# Patient Record
Sex: Male | Born: 1935 | Race: Black or African American | Hispanic: No | Smoking: Former smoker
Health system: Southern US, Community
[De-identification: ages and names within clinical notes are randomized; demographics above are authoritative.]

## PROBLEM LIST (undated history)

## (undated) DIAGNOSIS — D509 Iron deficiency anemia, unspecified: Secondary | ICD-10-CM

## (undated) DIAGNOSIS — I10 Essential (primary) hypertension: Secondary | ICD-10-CM

## (undated) DIAGNOSIS — I5032 Chronic diastolic (congestive) heart failure: Secondary | ICD-10-CM

## (undated) DIAGNOSIS — I639 Cerebral infarction, unspecified: Secondary | ICD-10-CM

## (undated) DIAGNOSIS — D126 Benign neoplasm of colon, unspecified: Secondary | ICD-10-CM

## (undated) DIAGNOSIS — G8194 Hemiplegia, unspecified affecting left nondominant side: Secondary | ICD-10-CM

## (undated) DIAGNOSIS — R945 Abnormal results of liver function studies: Secondary | ICD-10-CM

## (undated) DIAGNOSIS — E7800 Pure hypercholesterolemia, unspecified: Secondary | ICD-10-CM

## (undated) DIAGNOSIS — R451 Restlessness and agitation: Secondary | ICD-10-CM

## (undated) DIAGNOSIS — H547 Unspecified visual loss: Secondary | ICD-10-CM

## (undated) DIAGNOSIS — T783XXA Angioneurotic edema, initial encounter: Secondary | ICD-10-CM

## (undated) DIAGNOSIS — J449 Chronic obstructive pulmonary disease, unspecified: Secondary | ICD-10-CM

## (undated) DIAGNOSIS — J189 Pneumonia, unspecified organism: Secondary | ICD-10-CM

## (undated) DIAGNOSIS — I509 Heart failure, unspecified: Secondary | ICD-10-CM

## (undated) DIAGNOSIS — I351 Nonrheumatic aortic (valve) insufficiency: Secondary | ICD-10-CM

## (undated) DIAGNOSIS — M959 Acquired deformity of musculoskeletal system, unspecified: Secondary | ICD-10-CM

## (undated) HISTORY — DX: Chronic diastolic (congestive) heart failure: I50.32

## (undated) HISTORY — DX: Iron deficiency anemia, unspecified: D50.9

## (undated) HISTORY — DX: Pure hypercholesterolemia, unspecified: E78.00

## (undated) HISTORY — PX: AORTIC VALVE REPLACEMENT: SHX41

## (undated) HISTORY — PX: MULTIPLE TOOTH EXTRACTIONS: SHX2053

## (undated) HISTORY — PX: CARDIAC VALVE REPLACEMENT: SHX585

## (undated) HISTORY — DX: Abnormal results of liver function studies: R94.5

## (undated) HISTORY — DX: Restlessness and agitation: R45.1

## (undated) HISTORY — DX: Angioneurotic edema, initial encounter: T78.3XXA

## (undated) HISTORY — DX: Chronic obstructive pulmonary disease, unspecified: J44.9

## (undated) HISTORY — DX: Benign neoplasm of colon, unspecified: D12.6

## (undated) HISTORY — DX: Essential (primary) hypertension: I10

## (undated) HISTORY — DX: Acquired deformity of musculoskeletal system, unspecified: M95.9

---

## 1997-01-04 HISTORY — PX: CARDIAC CATHETERIZATION: SHX172

## 1997-06-28 ENCOUNTER — Emergency Department (HOSPITAL_COMMUNITY): Admission: EM | Admit: 1997-06-28 | Discharge: 1997-06-28 | Payer: Self-pay | Admitting: *Deleted

## 1997-07-27 ENCOUNTER — Emergency Department (HOSPITAL_COMMUNITY): Admission: EM | Admit: 1997-07-27 | Discharge: 1997-07-27 | Payer: Self-pay | Admitting: Emergency Medicine

## 1997-08-06 ENCOUNTER — Encounter: Admission: RE | Admit: 1997-08-06 | Discharge: 1997-08-06 | Payer: Self-pay | Admitting: Family Medicine

## 1997-08-19 ENCOUNTER — Encounter: Admission: RE | Admit: 1997-08-19 | Discharge: 1997-08-19 | Payer: Self-pay | Admitting: Sports Medicine

## 1998-01-13 ENCOUNTER — Encounter: Admission: RE | Admit: 1998-01-13 | Discharge: 1998-01-13 | Payer: Self-pay | Admitting: Family Medicine

## 1998-03-07 ENCOUNTER — Encounter: Admission: RE | Admit: 1998-03-07 | Discharge: 1998-03-07 | Payer: Self-pay | Admitting: Family Medicine

## 1998-03-20 ENCOUNTER — Encounter: Admission: RE | Admit: 1998-03-20 | Discharge: 1998-03-20 | Payer: Self-pay | Admitting: Family Medicine

## 1998-04-01 ENCOUNTER — Encounter: Admission: RE | Admit: 1998-04-01 | Discharge: 1998-04-01 | Payer: Self-pay | Admitting: Family Medicine

## 1998-08-12 ENCOUNTER — Encounter: Payer: Self-pay | Admitting: Family Medicine

## 1998-08-12 ENCOUNTER — Inpatient Hospital Stay (HOSPITAL_COMMUNITY): Admission: EM | Admit: 1998-08-12 | Discharge: 1998-08-13 | Payer: Self-pay | Admitting: Emergency Medicine

## 1998-08-22 ENCOUNTER — Encounter: Admission: RE | Admit: 1998-08-22 | Discharge: 1998-08-22 | Payer: Self-pay | Admitting: Family Medicine

## 1998-09-05 ENCOUNTER — Encounter: Admission: RE | Admit: 1998-09-05 | Discharge: 1998-09-05 | Payer: Self-pay | Admitting: Family Medicine

## 1998-09-12 ENCOUNTER — Encounter: Admission: RE | Admit: 1998-09-12 | Discharge: 1998-09-12 | Payer: Self-pay | Admitting: Family Medicine

## 1999-03-30 ENCOUNTER — Encounter: Payer: Self-pay | Admitting: Sports Medicine

## 1999-03-30 ENCOUNTER — Encounter: Admission: RE | Admit: 1999-03-30 | Discharge: 1999-03-30 | Payer: Self-pay | Admitting: Sports Medicine

## 1999-03-30 ENCOUNTER — Encounter: Admission: RE | Admit: 1999-03-30 | Discharge: 1999-03-30 | Payer: Self-pay | Admitting: Family Medicine

## 1999-04-15 ENCOUNTER — Encounter: Admission: RE | Admit: 1999-04-15 | Discharge: 1999-04-15 | Payer: Self-pay | Admitting: Family Medicine

## 1999-05-04 ENCOUNTER — Encounter: Admission: RE | Admit: 1999-05-04 | Discharge: 1999-05-04 | Payer: Self-pay | Admitting: Family Medicine

## 1999-07-30 ENCOUNTER — Encounter: Admission: RE | Admit: 1999-07-30 | Discharge: 1999-07-30 | Payer: Self-pay | Admitting: Family Medicine

## 1999-09-15 ENCOUNTER — Encounter: Admission: RE | Admit: 1999-09-15 | Discharge: 1999-09-15 | Payer: Self-pay | Admitting: Family Medicine

## 1999-09-23 ENCOUNTER — Encounter: Admission: RE | Admit: 1999-09-23 | Discharge: 1999-09-23 | Payer: Self-pay | Admitting: Family Medicine

## 2000-05-03 ENCOUNTER — Encounter: Admission: RE | Admit: 2000-05-03 | Discharge: 2000-05-03 | Payer: Self-pay | Admitting: Sports Medicine

## 2000-05-03 ENCOUNTER — Encounter: Payer: Self-pay | Admitting: Sports Medicine

## 2000-05-16 ENCOUNTER — Encounter: Admission: RE | Admit: 2000-05-16 | Discharge: 2000-05-16 | Payer: Self-pay | Admitting: Family Medicine

## 2000-09-28 ENCOUNTER — Emergency Department (HOSPITAL_COMMUNITY): Admission: EM | Admit: 2000-09-28 | Discharge: 2000-09-28 | Payer: Self-pay

## 2000-11-29 ENCOUNTER — Encounter: Admission: RE | Admit: 2000-11-29 | Discharge: 2001-02-27 | Payer: Self-pay | Admitting: Orthopaedic Surgery

## 2000-12-06 ENCOUNTER — Encounter: Admission: RE | Admit: 2000-12-06 | Discharge: 2000-12-06 | Payer: Self-pay | Admitting: Family Medicine

## 2001-06-06 ENCOUNTER — Ambulatory Visit (HOSPITAL_COMMUNITY): Admission: RE | Admit: 2001-06-06 | Discharge: 2001-06-06 | Payer: Self-pay | Admitting: Sports Medicine

## 2001-06-06 ENCOUNTER — Encounter: Admission: RE | Admit: 2001-06-06 | Discharge: 2001-06-06 | Payer: Self-pay | Admitting: Sports Medicine

## 2001-07-10 ENCOUNTER — Encounter: Admission: RE | Admit: 2001-07-10 | Discharge: 2001-07-10 | Payer: Self-pay | Admitting: Sports Medicine

## 2001-09-25 ENCOUNTER — Encounter: Admission: RE | Admit: 2001-09-25 | Discharge: 2001-09-25 | Payer: Self-pay | Admitting: Family Medicine

## 2001-09-27 ENCOUNTER — Encounter: Admission: RE | Admit: 2001-09-27 | Discharge: 2001-09-27 | Payer: Self-pay | Admitting: Family Medicine

## 2002-01-11 ENCOUNTER — Encounter: Admission: RE | Admit: 2002-01-11 | Discharge: 2002-01-11 | Payer: Self-pay | Admitting: Family Medicine

## 2002-01-23 ENCOUNTER — Encounter: Payer: Self-pay | Admitting: Family Medicine

## 2002-01-23 ENCOUNTER — Encounter: Admission: RE | Admit: 2002-01-23 | Discharge: 2002-01-23 | Payer: Self-pay | Admitting: Family Medicine

## 2002-01-25 ENCOUNTER — Encounter: Admission: RE | Admit: 2002-01-25 | Discharge: 2002-01-25 | Payer: Self-pay | Admitting: Family Medicine

## 2002-04-04 ENCOUNTER — Encounter: Admission: RE | Admit: 2002-04-04 | Discharge: 2002-04-04 | Payer: Self-pay | Admitting: Family Medicine

## 2002-04-27 ENCOUNTER — Encounter: Admission: RE | Admit: 2002-04-27 | Discharge: 2002-04-27 | Payer: Self-pay | Admitting: Family Medicine

## 2002-05-28 ENCOUNTER — Encounter: Admission: RE | Admit: 2002-05-28 | Discharge: 2002-05-28 | Payer: Self-pay | Admitting: Family Medicine

## 2002-08-14 ENCOUNTER — Encounter: Admission: RE | Admit: 2002-08-14 | Discharge: 2002-08-14 | Payer: Self-pay | Admitting: Family Medicine

## 2002-08-21 ENCOUNTER — Encounter: Payer: Self-pay | Admitting: *Deleted

## 2002-08-21 ENCOUNTER — Encounter: Admission: RE | Admit: 2002-08-21 | Discharge: 2002-08-21 | Payer: Self-pay | Admitting: *Deleted

## 2002-09-18 ENCOUNTER — Encounter: Admission: RE | Admit: 2002-09-18 | Discharge: 2002-09-18 | Payer: Self-pay | Admitting: Sports Medicine

## 2002-10-16 ENCOUNTER — Ambulatory Visit (HOSPITAL_COMMUNITY): Admission: RE | Admit: 2002-10-16 | Discharge: 2002-10-16 | Payer: Self-pay | Admitting: Sports Medicine

## 2002-10-16 ENCOUNTER — Encounter: Payer: Self-pay | Admitting: Sports Medicine

## 2002-10-25 ENCOUNTER — Encounter: Admission: RE | Admit: 2002-10-25 | Discharge: 2002-10-25 | Payer: Self-pay | Admitting: Family Medicine

## 2002-10-29 ENCOUNTER — Encounter: Admission: RE | Admit: 2002-10-29 | Discharge: 2002-10-29 | Payer: Self-pay | Admitting: Sports Medicine

## 2002-10-29 ENCOUNTER — Encounter: Payer: Self-pay | Admitting: Sports Medicine

## 2002-11-26 ENCOUNTER — Encounter: Admission: RE | Admit: 2002-11-26 | Discharge: 2002-11-26 | Payer: Self-pay | Admitting: Family Medicine

## 2002-12-16 ENCOUNTER — Emergency Department (HOSPITAL_COMMUNITY): Admission: EM | Admit: 2002-12-16 | Discharge: 2002-12-16 | Payer: Self-pay | Admitting: Emergency Medicine

## 2003-01-02 ENCOUNTER — Encounter: Admission: RE | Admit: 2003-01-02 | Discharge: 2003-01-02 | Payer: Self-pay | Admitting: Family Medicine

## 2003-02-06 ENCOUNTER — Encounter: Admission: RE | Admit: 2003-02-06 | Discharge: 2003-02-06 | Payer: Self-pay | Admitting: Family Medicine

## 2003-03-08 ENCOUNTER — Encounter: Payer: Self-pay | Admitting: Gastroenterology

## 2003-05-09 ENCOUNTER — Encounter: Admission: RE | Admit: 2003-05-09 | Discharge: 2003-05-09 | Payer: Self-pay | Admitting: Family Medicine

## 2003-07-15 ENCOUNTER — Encounter: Admission: RE | Admit: 2003-07-15 | Discharge: 2003-07-15 | Payer: Self-pay | Admitting: Family Medicine

## 2003-11-18 ENCOUNTER — Ambulatory Visit: Payer: Self-pay | Admitting: Family Medicine

## 2003-11-20 ENCOUNTER — Ambulatory Visit: Payer: Self-pay | Admitting: Family Medicine

## 2003-12-23 ENCOUNTER — Ambulatory Visit: Payer: Self-pay | Admitting: Cardiology

## 2004-01-03 ENCOUNTER — Ambulatory Visit: Payer: Self-pay

## 2004-03-04 DIAGNOSIS — R945 Abnormal results of liver function studies: Secondary | ICD-10-CM

## 2004-03-04 DIAGNOSIS — R7989 Other specified abnormal findings of blood chemistry: Secondary | ICD-10-CM

## 2004-03-04 HISTORY — DX: Abnormal results of liver function studies: R94.5

## 2004-03-04 HISTORY — DX: Other specified abnormal findings of blood chemistry: R79.89

## 2004-03-16 ENCOUNTER — Ambulatory Visit: Payer: Self-pay | Admitting: Sports Medicine

## 2004-04-19 ENCOUNTER — Emergency Department (HOSPITAL_COMMUNITY): Admission: EM | Admit: 2004-04-19 | Discharge: 2004-04-19 | Payer: Self-pay | Admitting: Emergency Medicine

## 2004-04-20 ENCOUNTER — Encounter: Admission: RE | Admit: 2004-04-20 | Discharge: 2004-04-20 | Payer: Self-pay | Admitting: Family Medicine

## 2004-04-20 ENCOUNTER — Ambulatory Visit: Payer: Self-pay | Admitting: Family Medicine

## 2004-04-28 ENCOUNTER — Ambulatory Visit: Payer: Self-pay | Admitting: Sports Medicine

## 2004-09-21 ENCOUNTER — Ambulatory Visit: Payer: Self-pay | Admitting: Sports Medicine

## 2004-11-06 ENCOUNTER — Ambulatory Visit: Payer: Self-pay | Admitting: Family Medicine

## 2004-12-24 ENCOUNTER — Ambulatory Visit: Payer: Self-pay | Admitting: Cardiology

## 2005-01-11 ENCOUNTER — Encounter: Payer: Self-pay | Admitting: Cardiology

## 2005-01-11 ENCOUNTER — Ambulatory Visit: Payer: Self-pay

## 2005-03-25 ENCOUNTER — Ambulatory Visit: Payer: Self-pay | Admitting: Family Medicine

## 2005-03-26 ENCOUNTER — Ambulatory Visit (HOSPITAL_COMMUNITY): Admission: RE | Admit: 2005-03-26 | Discharge: 2005-03-26 | Payer: Self-pay | Admitting: Family Medicine

## 2005-03-26 ENCOUNTER — Encounter: Payer: Self-pay | Admitting: Vascular Surgery

## 2005-04-12 ENCOUNTER — Emergency Department (HOSPITAL_COMMUNITY): Admission: EM | Admit: 2005-04-12 | Discharge: 2005-04-12 | Payer: Self-pay | Admitting: Emergency Medicine

## 2005-04-18 ENCOUNTER — Inpatient Hospital Stay (HOSPITAL_COMMUNITY): Admission: EM | Admit: 2005-04-18 | Discharge: 2005-04-18 | Payer: Self-pay | Admitting: Emergency Medicine

## 2005-04-18 ENCOUNTER — Ambulatory Visit: Payer: Self-pay | Admitting: Family Medicine

## 2005-04-22 ENCOUNTER — Ambulatory Visit: Payer: Self-pay | Admitting: Family Medicine

## 2005-07-13 ENCOUNTER — Ambulatory Visit: Payer: Self-pay | Admitting: Family Medicine

## 2005-09-10 ENCOUNTER — Ambulatory Visit: Payer: Self-pay | Admitting: Family Medicine

## 2005-12-22 ENCOUNTER — Ambulatory Visit: Payer: Self-pay | Admitting: Family Medicine

## 2005-12-24 ENCOUNTER — Ambulatory Visit (HOSPITAL_COMMUNITY): Admission: RE | Admit: 2005-12-24 | Discharge: 2005-12-24 | Payer: Self-pay | Admitting: Family Medicine

## 2006-01-07 ENCOUNTER — Ambulatory Visit: Payer: Self-pay | Admitting: Family Medicine

## 2006-01-24 ENCOUNTER — Ambulatory Visit: Payer: Self-pay | Admitting: Cardiology

## 2006-01-24 LAB — CONVERTED CEMR LAB
ALT: 15 units/L (ref 0–40)
AST: 18 units/L (ref 0–37)
Alkaline Phosphatase: 79 units/L (ref 39–117)
BUN: 11 mg/dL (ref 6–23)
CO2: 25 meq/L (ref 19–32)
Chloride: 104 meq/L (ref 96–112)
GFR calc Af Amer: 76 mL/min
GFR calc non Af Amer: 62 mL/min
HDL: 77.1 mg/dL (ref >39.0)
Potassium: 3.2 meq/L — ABNORMAL LOW (ref 3.5–5.1)
Sodium: 140 meq/L (ref 135–145)
Total CHOL/HDL Ratio: 2.3
Triglycerides: 43 mg/dL (ref 0–149)
VLDL: 9 mg/dL (ref 0–40)

## 2006-02-03 ENCOUNTER — Ambulatory Visit: Payer: Self-pay

## 2006-02-03 ENCOUNTER — Encounter: Payer: Self-pay | Admitting: Cardiology

## 2006-02-15 ENCOUNTER — Ambulatory Visit: Payer: Self-pay

## 2006-02-15 LAB — CONVERTED CEMR LAB
CO2: 28 meq/L (ref 19–32)
Chloride: 103 meq/L (ref 96–112)
GFR calc non Af Amer: 69 mL/min
Potassium: 4.1 meq/L (ref 3.5–5.1)

## 2006-03-03 DIAGNOSIS — I5032 Chronic diastolic (congestive) heart failure: Secondary | ICD-10-CM

## 2006-03-03 DIAGNOSIS — I2510 Atherosclerotic heart disease of native coronary artery without angina pectoris: Secondary | ICD-10-CM | POA: Insufficient documentation

## 2006-03-03 DIAGNOSIS — D126 Benign neoplasm of colon, unspecified: Secondary | ICD-10-CM

## 2006-03-03 DIAGNOSIS — I10 Essential (primary) hypertension: Secondary | ICD-10-CM

## 2006-03-03 DIAGNOSIS — D509 Iron deficiency anemia, unspecified: Secondary | ICD-10-CM

## 2006-03-03 DIAGNOSIS — M959 Acquired deformity of musculoskeletal system, unspecified: Secondary | ICD-10-CM | POA: Insufficient documentation

## 2006-03-03 DIAGNOSIS — I351 Nonrheumatic aortic (valve) insufficiency: Secondary | ICD-10-CM | POA: Insufficient documentation

## 2006-03-03 DIAGNOSIS — J449 Chronic obstructive pulmonary disease, unspecified: Secondary | ICD-10-CM | POA: Insufficient documentation

## 2006-03-03 DIAGNOSIS — E7800 Pure hypercholesterolemia, unspecified: Secondary | ICD-10-CM | POA: Insufficient documentation

## 2006-03-03 DIAGNOSIS — T783XXA Angioneurotic edema, initial encounter: Secondary | ICD-10-CM | POA: Insufficient documentation

## 2007-05-04 ENCOUNTER — Ambulatory Visit: Payer: Self-pay | Admitting: Cardiology

## 2007-05-17 ENCOUNTER — Ambulatory Visit: Payer: Self-pay

## 2007-05-17 ENCOUNTER — Encounter: Payer: Self-pay | Admitting: Cardiology

## 2007-07-22 ENCOUNTER — Encounter: Admission: RE | Admit: 2007-07-22 | Discharge: 2007-07-22 | Payer: Self-pay | Admitting: Internal Medicine

## 2008-02-28 ENCOUNTER — Encounter (INDEPENDENT_AMBULATORY_CARE_PROVIDER_SITE_OTHER): Payer: Self-pay | Admitting: *Deleted

## 2008-04-03 ENCOUNTER — Ambulatory Visit: Payer: Self-pay | Admitting: Gastroenterology

## 2008-04-03 DIAGNOSIS — Z952 Presence of prosthetic heart valve: Secondary | ICD-10-CM

## 2008-04-12 ENCOUNTER — Telehealth: Payer: Self-pay | Admitting: Gastroenterology

## 2008-05-07 ENCOUNTER — Encounter: Payer: Self-pay | Admitting: Gastroenterology

## 2008-05-07 ENCOUNTER — Ambulatory Visit: Payer: Self-pay | Admitting: Gastroenterology

## 2008-05-09 ENCOUNTER — Encounter: Payer: Self-pay | Admitting: Gastroenterology

## 2008-05-16 ENCOUNTER — Telehealth: Payer: Self-pay | Admitting: Gastroenterology

## 2008-06-06 DIAGNOSIS — E785 Hyperlipidemia, unspecified: Secondary | ICD-10-CM | POA: Insufficient documentation

## 2008-06-07 ENCOUNTER — Ambulatory Visit: Payer: Self-pay | Admitting: Cardiology

## 2008-06-21 ENCOUNTER — Ambulatory Visit: Payer: Self-pay

## 2008-06-21 ENCOUNTER — Encounter: Payer: Self-pay | Admitting: Cardiology

## 2009-05-09 ENCOUNTER — Encounter (INDEPENDENT_AMBULATORY_CARE_PROVIDER_SITE_OTHER): Payer: Self-pay | Admitting: *Deleted

## 2009-07-11 ENCOUNTER — Ambulatory Visit: Payer: Self-pay | Admitting: Gastroenterology

## 2009-07-11 DIAGNOSIS — Z860100 Personal history of colon polyps, unspecified: Secondary | ICD-10-CM | POA: Insufficient documentation

## 2009-07-31 ENCOUNTER — Ambulatory Visit: Payer: Self-pay | Admitting: Gastroenterology

## 2009-08-05 ENCOUNTER — Encounter: Payer: Self-pay | Admitting: Gastroenterology

## 2009-12-04 ENCOUNTER — Ambulatory Visit: Payer: Self-pay | Admitting: Cardiology

## 2009-12-04 ENCOUNTER — Encounter: Payer: Self-pay | Admitting: Cardiology

## 2009-12-05 ENCOUNTER — Ambulatory Visit: Payer: Self-pay | Admitting: Cardiology

## 2009-12-05 ENCOUNTER — Ambulatory Visit: Payer: Self-pay

## 2009-12-05 ENCOUNTER — Encounter: Payer: Self-pay | Admitting: Cardiology

## 2009-12-05 ENCOUNTER — Ambulatory Visit (HOSPITAL_COMMUNITY)
Admission: RE | Admit: 2009-12-05 | Discharge: 2009-12-05 | Payer: Self-pay | Source: Home / Self Care | Admitting: Cardiology

## 2009-12-05 LAB — CONVERTED CEMR LAB
BUN: 26 mg/dL — ABNORMAL HIGH (ref 6–23)
Calcium: 8.6 mg/dL (ref 8.4–10.5)
Creatinine, Ser: 1 mg/dL (ref 0.4–1.5)
Glucose, Bld: 89 mg/dL (ref 70–99)
Sodium: 137 meq/L (ref 135–145)

## 2010-02-03 NOTE — Assessment & Plan Note (Signed)
Summary: per check out/sf  Medications Added AGGRENOX 25-200 MG XR12H-CAP (ASPIRIN-DIPYRIDAMOLE) Take 1 tablet by mouth two times a day PERCOCET 5-325 MG TABS (OXYCODONE-ACETAMINOPHEN) as needed      Allergies Added: NKDA  Visit Type:  Follow-up Primary Provider:  Frutoso Chase, MD  CC:  no complaints.  History of Present Illness: [name] is a pleasant gentleman who has a history of aortic valvereplacement with a pericardial tissue valve.  His most recentechocardiogram was performed in June of 2010.  His LV function was normal. There was a bioprosthetic aortic valve. There was mild aortic insufficiency and mild MR; mild LAE.  Note, at the time of his aortic valve replacement a catheterization in 1999 showed no obstructive disease prior to the procedure. I last saw the patient in June of 2010. Since then the patient denies any dyspnea on exertion, orthopnea, PND, pedal edema, palpitations, syncope or chest pain. His daughter gives him Lasix 20 mg daily as needed but he only requires this on average monthly.   Current Medications (verified): 1)  Cozaar 50 Mg Tabs (Losartan Potassium) .... Take 1 Tablet Daily 2)  Lasix 20 Mg Tabs (Furosemide) .... Take 1 Tablet Daily As Needed 3)  Potassium Chloride Crys Cr 20 Meq Cr-Tabs (Potassium Chloride Crys Cr) .... Take 1 Tab As Needed 4)  Multivitamins   Tabs (Multiple Vitamin) .... One Tablet By Mouth Once Daily 5)  Vesicare 5 Mg Tabs (Solifenacin Succinate) .... One Tablet By Mouth Once Daily 6)  Advair Diskus 250-50 Mcg/dose Misc (Fluticasone-Salmeterol) .... 2 Puffs Once Daily 7)  Aggrenox 25-200 Mg Xr12h-Cap (Aspirin-Dipyridamole) .... Take 1 Tablet By Mouth Two Times A Day 8)  Miralax  Powd (Polyethylene Glycol 3350) .... As Needed 9)  Percocet 5-325 Mg Tabs (Oxycodone-Acetaminophen) .... As Needed  Allergies (verified): No Known Drug Allergies  Past History:  Past Medical History: CEREBROVAS DIS, LATE EFFECTS (S/P CVA)  (ICD-738.9) CHF, EJECTION FRACTION > OR = 50% (ICD-428.32) AORTIC VALVE INSUFFICIENCY (ICD-424.1) AORTIC VALVE REPLACEMENT, HX OF (ICD-V43.3) HYPERTENSION, BENIGN SYSTEMIC (ICD-401.1) HYPERCHOLESTEROLEMIA (ICD-272.0) COPD (ICD-496) COLON POLYP (ICD-211.3) ANGIOEDEMA (ICD-995.1) ANEMIA, IRON DEFICIENCY, UNSPEC. (ICD-280.9)  3/06 LFT`s wnl, Cr= 1.3 stable, 4/06 likely TIA, 4/06 pt may have DISH syndrome (thick c vertebrae), ataxic gait 2/2 to L hemiparesis, bx of scalp lesions = blue nevus (benign) 6/04, colonoscpy3/05 - adenomatous polyps,mild gastritis, -diffuse interosseous skeletal hyperostosis (DISH), h/o H. pylori s/p tx  Past Surgical History: Reviewed history from 06/07/2008 and no changes required.  Aortic Valve Replacement with a pericardial tissue valve  -, Cardiac Cath 01/99: mild CAD; EF: 35-40%   Social History: Reviewed history from 03/03/2006 and no changes required. Married to [employer].  Remote  h/o  tob abuse.  Children involved  in his life.  Lives with daughter.  Review of Systems       no fevers or chills, productive cough, hemoptysis, dysphasia, odynophagia, melena, hematochezia, dysuria, hematuria, rash, seizure activity, orthopnea, PND, pedal edema, claudication. Remaining systems are negative.   Vital Signs:  Patient profile:   77 year old male Height:      66 inches Weight:      157 pounds BMI:     25.43 Pulse rate:   64 / minute BP sitting:   122 / 74  (right arm) Cuff size:   regular  Vitals Entered By: Hardin Negus, RMA (December 04, 2009 9:06 AM)  Physical Exam  General:  Well-developed well-nourished in no acute distress.  Skin is warm and dry.  HEENT is normal.  Neck is supple. No thyromegaly.  Chest is clear to auscultation with normal expansion. 2/6 systolic murmur left sternal border. No diastolic murmur. Cardiovascular exam is regular rate and rhythm.  Abdominal exam nontender or distended. No masses palpated. Extremities show no  edema. neuro residual left hemiparesis from prior CVA.    EKG  Procedure date:  12/04/2009  Findings:      Sinus rhythm, first degree AV block, left anterior fascicular block, left ventricular hypertrophy, cannot rule out prior anterior infarct.  Impression & Recommendations:  Problem # 1:  AORTIC VALVE INSUFFICIENCY (ICD-424.1) Status post aortic valve replacement. Check echocardiogram. Continued SBE prophylaxis. His updated medication list for this problem includes:    Cozaar 50 Mg Tabs (Losartan potassium) .Marland Kitchen... Take 1 tablet daily    Lasix 20 Mg Tabs (Furosemide) .Marland Kitchen... Take 1 tablet daily as needed  Problem # 2:  HYPERLIPIDEMIA (ICD-272.4) Management per primary care.  Problem # 3:  CEREBROVAS DIS, LATE EFFECTS (S/P CVA) (ICD-738.9)  Problem # 4:  HYPERTENSION, BENIGN SYSTEMIC (ICD-401.1) Blood pressure controlled on present medications. Will continue. Check potassium and renal function. His updated medication list for this problem includes:    Cozaar 50 Mg Tabs (Losartan potassium) .Marland Kitchen... Take 1 tablet daily    Lasix 20 Mg Tabs (Furosemide) .Marland Kitchen... Take 1 tablet daily as needed  Other Orders: EKG w/ Interpretation (93000) Echocardiogram (Echo) TLB-BMP (Basic Metabolic Panel-BMET) (80048-METABOL)  Patient Instructions: 1)  Your physician recommends that you schedule a follow-up appointment in: 1 year. 2)  Your physician recommends that you continue on your current medications as directed. Please refer to the Current Medication list given to you today. 3)  Your physician has requested that you have an echocardiogram.  Echocardiography is a painless test that uses sound waves to create images of your heart. It provides your doctor with information about the size and shape of your heart and how well your heart's chambers and valves are working.  This procedure takes approximately one hour. There are no restrictions for this procedure.

## 2010-02-03 NOTE — Letter (Signed)
Summary: Patient Notice- Polyp Results  [facility]  [address] [place], [place] [address]   Phone: [phone]  Fax: [phone]        August 05, 2009 MRN: [id number]    [facility] [address] [place], [place]  [address]    Dear Mr. THONG,  I am pleased to inform you that the colon polyp(s) removed during your recent colonoscopy was (were) found to be benign (no cancer detected) upon pathologic examination.  I recommend you have a repeat colonoscopy examination in 5_ years to look for recurrent polyps, as having colon polyps increases your risk for having recurrent polyps or even colon cancer in the future.  Should you develop new or worsening symptoms of abdominal pain, bowel habit changes or bleeding from the rectum or bowels, please schedule an evaluation with either your primary care physician or with me.  Additional information/recommendations:  __ No further action with gastroenterology is needed at this time. Please      follow-up with your primary care physician for your other healthcare      needs.  __ Please call [phone] to schedule a return visit to review your      situation.  __ Please keep your follow-up visit as already scheduled.  _x_ Continue treatment plan as outlined the day of your exam.  Please call us if you are having persistent problems or have questions about your condition that have not been fully answered at this time.  Sincerely,  Louis Meckel MD  This letter has been electronically signed by your physician.  Appended Document: Patient Notice- Polyp Results Letter mailed 8.3.2011

## 2010-02-03 NOTE — Letter (Signed)
Summary: [facility] Instructions  [facility]  [address] [place], [place] [address]   Phone: [phone]  Fax: [phone]       Aaron Johnson    1926/02/02    MRN: [id number]        Procedure Day /Date:THURSDAY 07/31/2009     Arrival Time:2PM     Procedure Time:3PM     Location of Procedure:                    X   [facility] (4th Floor)  PREPARATION FOR COLONOSCOPY WITH MOVIPREP   Starting 5 days prior to your procedure 07/26/2009 do not eat nuts, seeds, popcorn, corn, beans, peas,  salads, or any raw vegetables.  Do not take any fiber supplements (e.g. Metamucil, Citrucel, and Benefiber).  THE DAY BEFORE YOUR PROCEDURE         DATE: 07/30/2009  DAY: WEDNESDAY  1.  Drink clear liquids the entire day-NO SOLID FOOD  2.  Do not drink anything colored red or purple.  Avoid juices with pulp.  No orange juice.  3.  Drink at least 64 oz. (8 glasses) of fluid/clear liquids during the day to prevent dehydration and help the prep work efficiently.  CLEAR LIQUIDS INCLUDE: Water Jello Ice Popsicles Tea (sugar ok, no milk/cream) Powdered fruit flavored drinks Coffee (sugar ok, no milk/cream) Gatorade Juice: apple, white grape, white cranberry  Lemonade Clear bullion, consomm, broth Carbonated beverages (any kind) Strained chicken noodle soup Hard Candy                             4.  In the morning, mix first dose of MoviPrep solution:    Empty 1 Pouch A and 1 Pouch B into the disposable container    Add lukewarm drinking water to the top line of the container. Mix to dissolve    Refrigerate (mixed solution should be used within 24 hrs)  5.  Begin drinking the prep at 5:00 p.m. The MoviPrep container is divided by 4 marks.   Every 15 minutes drink the solution down to the next mark (approximately 8 oz) until the full liter is complete.   6.  Follow completed prep with 16 oz of clear liquid of your choice (Nothing red or purple).  Continue to  drink clear liquids until bedtime.  7.  Before going to bed, mix second dose of MoviPrep solution:    Empty 1 Pouch A and 1 Pouch B into the disposable container    Add lukewarm drinking water to the top line of the container. Mix to dissolve    Refrigerate  THE DAY OF YOUR PROCEDURE      DATE: 07/31/2009 DAY: THURSDAY  Beginning at 10a.m. (5 hours before procedure):         1. Every 15 minutes, drink the solution down to the next mark (approx 8 oz) until the full liter is complete.  2. Follow completed prep with 16 oz. of clear liquid of your choice.    3. You may drink clear liquids until1PM (2 HOURS BEFORE PROCEDURE).   MEDICATION INSTRUCTIONS  Unless otherwise instructed, you should take regular prescription medications with a small sip of water   as early as possible the morning of your procedure.    Stop taking Aggrenox on  07/24/2009 PER DR KAPLAN  (7 days before procedure).  OTHER INSTRUCTIONS  You will need a responsible adult at least 75 years of age to accompany you and drive you home.   This person must remain in the waiting room during your procedure.  Wear loose fitting clothing that is easily removed.  Leave jewelry and other valuables at home.  However, you may wish to bring a book to read or  an iPod/MP3 player to listen to music as you wait for your procedure to start.  Remove all body piercing jewelry and leave at home.  Total time from sign-in until discharge is approximately 2-3 hours.  You should go home directly after your procedure and rest.  You can resume normal activities the  day after your procedure.  The day of your procedure you should not:   Drive   Make legal decisions   Operate machinery   Drink alcohol   Return to work  You will receive specific instructions about eating, activities and medications before you leave.    The above instructions have been reviewed and explained to me by    _______________________    I fully understand and can verbalize these instructions _____________________________ Date _________

## 2010-02-03 NOTE — Letter (Signed)
Summary: Colonoscopy-Changed to Office Visit Letter  [facility]  [address] [place], [place] [address]   Phone: [phone]  Fax: [phone]      May 09, 2009 MRN: [id number]   Aaron Johnson [address] [place], [place]  [address]   Dear Aaron Johnson,   According to our records, it is time for you to schedule a Colonoscopy. However, after reviewing your medical record, I feel that an office visit would be most appropriate to more completely evaluate you and determine your need for a repeat procedure.  Please call [phone] (option #2) at your convenience to schedule an office visit. If you have any questions, concerns, or feel that this letter is in error, we would appreciate your call.   Sincerely,  Barbette Hair. Arlyce Dice, M.D.  [facility] [phone]

## 2010-02-03 NOTE — Procedures (Signed)
Summary: Colonoscopy  Patient: [name] Note: All result statuses are Final unless otherwise noted.  Tests: (1) Colonoscopy (COL)   COL Colonoscopy           DONE     [facility]     [address]     [place], [place]  [address]           COLONOSCOPY PROCEDURE REPORT           PATIENT:  Aaron Johnson, Aaron Johnson  MR#:  [id number]     BIRTHDATE:  05-25-1926, 83 yrs. old  GENDER:  male           ENDOSCOPIST:  Barbette Hair. Arlyce Dice, MD     Referred by:           PROCEDURE DATE:  07/31/2009     PROCEDURE:  Colonoscopy with snare polypectomy     ASA CLASS:  Class II     INDICATIONS:  1) screening  2) history of pre-cancerous     (adenomatous) colon polyps Multiple right colon polyps removed 1     year ago. f/u colon b/c of multiplicity of polyps           MEDICATIONS:   Fentanyl 50 mcg IV, Versed 4 mg IV           DESCRIPTION OF PROCEDURE:   After the risks benefits and     alternatives of the procedure were thoroughly explained, informed     consent was obtained.  Digital rectal exam was performed and     revealed no abnormalities.   The LB [id number] endoscope     was introduced through the anus and advanced to the cecum, which     was identified by both the appendix and ileocecal valve, without     limitations.  The quality of the prep was good, using MoviPrep.     The instrument was then slowly withdrawn as the colon was fully     examined.     <<PROCEDUREIMAGES>>           FINDINGS:  A sessile polyp was found in the ascending colon. It     was 3 mm in size. Polyp was snared without cautery. Retrieval was     successful (see image3). snare polyp  Mild diverticulosis was     found in the sigmoid colon (see image10).  This was otherwise a     normal examination of the colon (see image1, image2, image4,     image6, image8, image12, and image13).   Retroflexed views in the     rectum revealed no abnormalities.    The time to cecum =  5.0     minutes. The scope was then  withdrawn (time =  7.50  min) from the     patient and the procedure completed.           COMPLICATIONS:  None           ENDOSCOPIC IMPRESSION:     1) 3 mm sessile polyp in the ascending colon     2) Mild diverticulosis in the sigmoid colon     3) Otherwise normal examination     RECOMMENDATIONS:     1) Colonoscopy 5 years           REPEAT EXAM:  In 5 year(s) for Colonoscopy.           ______________________________     Barbette Hair. Arlyce Dice, MD  CC: Renford Dills MD           n.     Rosalie DoctorBarbette Hair. Ayaan Shutes at 07/31/2009 04:08 PM           Percell Belt, [id number]  Note: An exclamation mark (!) indicates a result that was not dispersed into the flowsheet. Document Creation Date: 07/31/2009 4:10 PM _______________________________________________________________________  (1) Order result status: Final Collection or observation date-time: 07/31/2009 16:00 Requested date-time:  Receipt date-time:  Reported date-time:  Referring Physician:   Ordering Physician: Melvia Heaps [phone]) Specimen Source:  Source: Launa Grill Order Number: [id number] Lab site:   Appended Document: Colonoscopy     Procedures Next Due Date:    Colonoscopy: 07/2014

## 2010-02-03 NOTE — Assessment & Plan Note (Signed)
Summary: f/u--ch.    History of Present Illness Visit Type: Follow-up Visit Primary GI MD: Melvia Heaps MD [facility] Primary Provider: Frutoso Chase, MD Chief Complaint: Annual visit, no problems History of Present Illness:   Mr. Tesar has returned for followup of his colon polyps.  Approximately one year ago several sessile right colon polyps were removed.  Followup colonoscopy in one year was recommended.  He has no GI complaints including abdominal pain, change in bowel habits, or rectal bleeding.  He remains on Aggrenox because of a history of a CVA.  The patient is status post aortic valve replacement.  He has COPD.   GI Review of Systems      Denies abdominal pain, acid reflux, belching, bloating, chest pain, dysphagia with liquids, dysphagia with solids, heartburn, loss of appetite, nausea, vomiting, vomiting blood, weight loss, and  weight gain.        Denies anal fissure, black tarry stools, change in bowel habit, constipation, diarrhea, diverticulosis, fecal incontinence, heme positive stool, hemorrhoids, irritable bowel syndrome, jaundice, light color stool, liver problems, rectal bleeding, and  rectal pain.    Current Medications (verified): 1)  Cozaar 50 Mg Tabs (Losartan Potassium) .... Take 1 Tablet Daily 2)  Lasix 20 Mg Tabs (Furosemide) .... Take 1 Tablet Daily As Needed 3)  Potassium Chloride Crys Cr 20 Meq Cr-Tabs (Potassium Chloride Crys Cr) .... Take 1 Tab As Needed 4)  Multivitamins   Tabs (Multiple Vitamin) .... One Tablet By Mouth Once Daily 5)  Vesicare 5 Mg Tabs (Solifenacin Succinate) .... One Tablet By Mouth Once Daily 6)  Advair Diskus 250-50 Mcg/dose Misc (Fluticasone-Salmeterol) .... 2 Puffs Once Daily 7)  Aggrenox 25-200 Mg Xr12h-Cap (Aspirin-Dipyridamole) .... 2 Tablets By Mouth Once Daily 8)  Miralax  Powd (Polyethylene Glycol 3350) .... As Needed  Allergies (verified): No Known Drug Allergies  Past History:  Past Medical History: Reviewed  history from 06/07/2008 and no changes required. Current Problems:  CEREBROVAS DIS, LATE EFFECTS (S/P CVA) (ICD-738.9) CHF, EJECTION FRACTION > OR = 50% (ICD-428.32) AORTIC VALVE INSUFFICIENCY (ICD-424.1) AORTIC VALVE REPLACEMENT, HX OF (ICD-V43.3) HYPERTENSION, BENIGN SYSTEMIC (ICD-401.1) HYPERCHOLESTEROLEMIA (ICD-272.0) COPD (ICD-496) COLON POLYP (ICD-211.3) ANGIOEDEMA (ICD-995.1) ANEMIA, IRON DEFICIENCY, UNSPEC. (ICD-280.9) SPECIAL SCREENING FOR MALIGNANT NEOPLASMS COLON (ICD-V76.51)  3/06 LFT`s wnl, Cr= 1.3 stable, 4/06 likely TIA, 4/06 pt may have DISH syndrome (thick c vertebrae), ataxic gait 2/2 to L hemiparesis, bx of scalp lesions = blue nevus (benign) 6/04, colonoscpy3/05 - adenomatous polyps,mild gastritis, -diffuse interosseous skeletal hyperostosis (DISH), h/o H. pylori s/p tx  Past Surgical History: Reviewed history from 06/07/2008 and no changes required.  Aortic Valve Replacement with a pericardial tissue valve  -, Cardiac Cath 01/99: mild CAD; EF: 35-40%   Family History: Reviewed history from 04/03/2008 and no changes required. No FH of Colon Cancer: Family History of Heart Disease: Father  Social History: Reviewed history from 03/03/2006 and no changes required. Married to [employer].  Remote  h/o  tob abuse.  Children involved  in his life.  Lives with daughter.  Review of Systems       The patient complains of arthritis/joint pain, change in vision, heart rhythm changes, and urination changes/pain.  The patient denies allergy/sinus, anemia, anxiety-new, back pain, blood in urine, breast changes/lumps, confusion, cough, coughing up blood, depression-new, fainting, fatigue, fever, headaches-new, hearing problems, heart murmur, itching, menstrual pain, muscle pains/cramps, night sweats, nosebleeds, pregnancy symptoms, shortness of breath, skin rash, sleeping problems, sore throat, swelling of feet/legs, swollen lymph glands, thirst - excessive ,  urination - excessive ,  urine leakage, vision changes, and voice change.         All other systems were reviewed and were negative   Vital Signs:  Patient profile:   77 year old male Height:      66 inches Weight:      150 pounds BMI:     24.30 Pulse rate:   60 / minute Pulse rhythm:   irregular BP sitting:   110 / 68  (right arm) Cuff size:   regular  Vitals Entered By: June McMurray CMA Duncan Dull) (July 11, 2009 10:17 AM)  Physical Exam  Additional Exam:  On physical exam he is an only male in no acute distress  skin: anicteric HEENT: normocephalic; PEERLA; no nasal or pharyngeal abnormalities neck: supple nodes: no cervical lymphadenopathy chest: clear to ausculatation and percussion heart: no murmurs, gallops, or rubs abd: soft, nontender; BS normoactive; no abdominal masses, tenderness, organomegaly rectal: deferred ext: no cynanosis, clubbing, edema skeletal: no deformities neuro: oriented x 3; no focal abnormalities    Impression & Recommendations:  Problem # 1:  COLON POLYP (ICD-211.3) Plan followup colonoscopy.  Aggrenox will be held in advance of  the procedure.  Problem # 2:  AORTIC VALVE REPLACEMENT, HX OF (ICD-V43.3) Assessment: Comment Only  Problem # 3:  COPD (ICD-496) Assessment: Comment Only  Problem # 4:  CEREBROVAS DIS, LATE EFFECTS (S/P CVA) (ICD-738.9) Assessment: Comment Only  Other Orders: Colonoscopy (Colon)  Patient Instructions: 1)  Copy sent to : Frutoso Chase, MD 2)  Your Colonoscopy is scheduled for 07/31/2009 at 3pm 3)  You can pick up your MoviPrep from your pharmacy today 4)  Colonoscopy and Flexible Sigmoidoscopy brochure given.  5)  Conscious Sedation brochure given.  6)  The medication list was reviewed and reconciled.  All changed / newly prescribed medications were explained.  A complete medication list was provided to the patient / caregiver. Prescriptions: MOVIPREP 100 GM  SOLR (PEG-KCL-NACL-NASULF-NA ASC-C) As per prep instructions.  #1 x 0    Entered by:   Merri Ray CMA (AAMA)   Authorized by:   Louis Meckel MD   Signed by:   Merri Ray CMA (AAMA) on 07/11/2009   Method used:   Electronically to        [facility] [id number] (retail)       [address] [place], [place]  [address]       Ph: [phone]       Fax: [phone]   RxID:   [id number]

## 2010-05-02 ENCOUNTER — Inpatient Hospital Stay (HOSPITAL_COMMUNITY)
Admission: EM | Admit: 2010-05-02 | Discharge: 2010-05-05 | DRG: 641 | Disposition: A | Discharge status: Home Health | Payer: PRIVATE HEALTH INSURANCE | Attending: Internal Medicine | Admitting: Internal Medicine

## 2010-05-02 ENCOUNTER — Emergency Department (HOSPITAL_COMMUNITY): Payer: PRIVATE HEALTH INSURANCE

## 2010-05-02 DIAGNOSIS — E7800 Pure hypercholesterolemia, unspecified: Secondary | ICD-10-CM | POA: Diagnosis present

## 2010-05-02 DIAGNOSIS — I69959 Hemiplegia and hemiparesis following unspecified cerebrovascular disease affecting unspecified side: Secondary | ICD-10-CM

## 2010-05-02 DIAGNOSIS — Z952 Presence of prosthetic heart valve: Secondary | ICD-10-CM

## 2010-05-02 DIAGNOSIS — I10 Essential (primary) hypertension: Secondary | ICD-10-CM | POA: Diagnosis present

## 2010-05-02 DIAGNOSIS — R609 Edema, unspecified: Secondary | ICD-10-CM | POA: Diagnosis present

## 2010-05-02 DIAGNOSIS — Z79899 Other long term (current) drug therapy: Secondary | ICD-10-CM

## 2010-05-02 DIAGNOSIS — Z7902 Long term (current) use of antithrombotics/antiplatelets: Secondary | ICD-10-CM

## 2010-05-02 DIAGNOSIS — E871 Hypo-osmolality and hyponatremia: Principal | ICD-10-CM | POA: Diagnosis present

## 2010-05-02 LAB — DIFFERENTIAL
Basophils Absolute: 0 10*3/uL (ref 0.0–0.1)
Basophils Relative: 0 % (ref 0–1)
Lymphs Abs: 2.9 10*3/uL (ref 0.7–4.0)
Monocytes Relative: 9 % (ref 3–12)
Neutrophils Relative %: 57 % (ref 43–77)

## 2010-05-02 LAB — COMPREHENSIVE METABOLIC PANEL
ALT: 35 U/L (ref 0–53)
AST: 27 U/L (ref 0–37)
Albumin: 3.7 g/dL (ref 3.5–5.2)
Calcium: 8.2 mg/dL — ABNORMAL LOW (ref 8.4–10.5)
Chloride: 105 mEq/L (ref 96–112)
Potassium: 3.9 mEq/L (ref 3.5–5.1)
Total Protein: 7.1 g/dL (ref 6.0–8.3)

## 2010-05-02 LAB — RAPID URINE DRUG SCREEN, HOSP PERFORMED
Amphetamines: NOT DETECTED
Opiates: NOT DETECTED
Tetrahydrocannabinol: NOT DETECTED

## 2010-05-02 LAB — URINALYSIS, ROUTINE W REFLEX MICROSCOPIC
Hgb urine dipstick: NEGATIVE
Ketones, ur: NEGATIVE mg/dL
Nitrite: NEGATIVE
Protein, ur: NEGATIVE mg/dL
pH: 5.5 (ref 5.0–8.0)

## 2010-05-02 LAB — CBC
HCT: 33.5 % — ABNORMAL LOW (ref 39.0–52.0)
Hemoglobin: 10.8 g/dL — ABNORMAL LOW (ref 13.0–17.0)
Platelets: 91 10*3/uL — ABNORMAL LOW (ref 150–400)
RDW: 13.2 % (ref 11.5–15.5)
WBC: 9.1 10*3/uL (ref 4.0–10.5)

## 2010-05-02 LAB — APTT: aPTT: 28 seconds (ref 24–37)

## 2010-05-02 LAB — PROTIME-INR: INR: 1.13 (ref 0.00–1.49)

## 2010-05-02 LAB — ETHANOL: Alcohol, Ethyl (B): <5 mg/dL (ref 0–10)

## 2010-05-03 ENCOUNTER — Inpatient Hospital Stay (HOSPITAL_COMMUNITY): Payer: PRIVATE HEALTH INSURANCE

## 2010-05-03 LAB — CARDIAC PANEL(CRET KIN+CKTOT+MB+TROPI)
Relative Index: 1.3 (ref 0.0–2.5)
Total CK: 423 U/L — ABNORMAL HIGH (ref 7–232)
Total CK: 578 U/L — ABNORMAL HIGH (ref 7–232)
Troponin I: 0.02 ng/mL (ref 0.00–0.06)

## 2010-05-03 LAB — COMPREHENSIVE METABOLIC PANEL
ALT: 33 U/L (ref 0–53)
Albumin: 3.8 g/dL (ref 3.5–5.2)
Alkaline Phosphatase: 65 U/L (ref 39–117)
GFR calc Af Amer: >60 mL/min (ref >60)
Potassium: 4 mEq/L (ref 3.5–5.1)
Sodium: 139 mEq/L (ref 135–145)
Total Protein: 7.5 g/dL (ref 6.0–8.3)

## 2010-05-03 LAB — CBC
MCH: 28.1 pg (ref 26.0–34.0)
MCHC: 32.1 g/dL (ref 30.0–36.0)
Platelets: 100 10*3/uL — ABNORMAL LOW (ref 150–400)
RBC: 4.13 MIL/uL — ABNORMAL LOW (ref 4.22–5.81)
RDW: 13.3 % (ref 11.5–15.5)

## 2010-05-03 LAB — LIPID PANEL
Total CHOL/HDL Ratio: 1.7 RATIO
VLDL: 4 mg/dL (ref 0–40)

## 2010-05-03 LAB — CK TOTAL AND CKMB (NOT AT ARMC)
CK, MB: 1.7 ng/mL (ref 0.3–4.0)
Total CK: 183 U/L (ref 7–232)

## 2010-05-03 LAB — HEMOGLOBIN A1C
Hgb A1c MFr Bld: 5.3 % (ref <5.7)
Mean Plasma Glucose: 105 mg/dL (ref <117)

## 2010-05-03 LAB — PROTIME-INR: Prothrombin Time: 15 seconds (ref 11.6–15.2)

## 2010-05-03 LAB — TROPONIN I: Troponin I: 0.01 ng/mL (ref 0.00–0.06)

## 2010-05-04 LAB — BASIC METABOLIC PANEL
CO2: 23 mEq/L (ref 19–32)
Calcium: 8.4 mg/dL (ref 8.4–10.5)
Chloride: 107 mEq/L (ref 96–112)
Glucose, Bld: 109 mg/dL — ABNORMAL HIGH (ref 70–99)
Sodium: 135 mEq/L (ref 135–145)

## 2010-05-04 LAB — OSMOLALITY, URINE: Osmolality, Ur: 447 mOsm/kg (ref 390–1090)

## 2010-05-05 DIAGNOSIS — G459 Transient cerebral ischemic attack, unspecified: Secondary | ICD-10-CM

## 2010-05-05 LAB — BASIC METABOLIC PANEL
BUN: 14 mg/dL (ref 6–23)
Chloride: 105 mEq/L (ref 96–112)
GFR calc non Af Amer: >60 mL/min (ref >60)
Potassium: 3.9 mEq/L (ref 3.5–5.1)
Sodium: 138 mEq/L (ref 135–145)

## 2010-05-19 NOTE — Discharge Summary (Signed)
NAME:  Aaron Johnson, Aaron Johnson                ACCOUNT NO.:  [id number]  MEDICAL RECORD NO.:  [id number]           PATIENT TYPE:  I  LOCATION:  3011                         FACILITY:  [facility]  PHYSICIAN:  Deirdre Peer. Liesel Peckenpaugh, M.D. DATE OF BIRTH:  02/23/1926  DATE OF ADMISSION:  05/02/2010 DATE OF DISCHARGE:  05/05/2010                              DISCHARGE SUMMARY   DISCHARGE DIAGNOSES: 1. Mental status change, presumed secondary to dehydration from over     diuresis.  CT, MRI of the brain negative for cerebrovascular     accident.  UA negative for infection.  Monitor, no arrhythmia, the     patient did have a minimal elevated CK with normal troponin.  Aaron Johnson     had been having some issues with fall secondary to ambulatory     dysfunction.  Inpatient rehab recommended, however, the patient's     daughter refused, she prefers to have services at home. 2. Hypertension.  The patient has had issues with relative hypotension     in the past.  At this time, BP meds will be held including diuretic     losartan. 3. Azotemia secondary to over diuresis, plus or minus ARB. 4. Hypercholesterolemia. 5. 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 6. History of aortic valve replacement with porcine valve.  DISCHARGE MEDICATIONS: 1. Peri-Colace 100 mg b.i.d. 2. Advair Diskus b.i.d. 3. Aggrenox b.i.d. 4. Albuterol q.4 h. p.r.n. 5. Alphagan in both eyes q.12. 6. Dorzolamide ophthalmic solution q.12. 7. Lipitor 40 mg daily. 8. Multivitamin daily. 9. Travatan at bedtime. 10.The patient will hold his losartan, potassium, and Lasix.  DISPOSITION:  The patient will be discharged to home.  Home Health Services will be provided PT, OT.  The patient will be cared for by his daughter.  Once again she declines inpatient rehab.  CONSULTANTS:  None.  STUDIES:  Potassium at discharge 3.8, creatinine 1.1, hemoglobin A1c 5.3, total cholesterol 119, LDL 45, HDL 70.  CBC on  admission: Hemoglobin 10.8, white count 9.1, platelet 91,000.  TSH 1.5.  CK 183 with normal troponin, followup April 23.  HISTORY OF PRESENT ILLNESS:  A 77 year old male with history of stroke presents to the hospital with confusion and difficulty walking.  In the ED, the patient was evaluated and admission was deemed necessary for further evaluation and treatment to rule out TIA versus other etiology. Please see dictated H&P for further details.  PAST MEDICAL HISTORY/MEDICATION/SOCIAL HISTORY/PAST SURGICAL HISTORY/ALLERGIES/FAMILY HISTORY:  Per admission H&P.  HOSPITAL COURSE:  The patient was admitted to a telemetry floor bed for evaluation and treatment of mental status change and ambulatory dysfunction.  The patient had MRI, MRA, CT of the brain, no acute CVA was found.  They were unable to be perform transcranial Doppler's due to the patient's inability to lay down.  In the interim, the patient was hydrated and slowly improved to his baseline level of function.  No infectious etiology was found.  Aaron Johnson did have a mild electrolyte abnormality, hyponatremia.  At this time, the patient is at his baseline level of function.  Inpatient  rehab has been recommended, however, the daughter has refused.  The differential for the patient's mental status change is dehydration, plus or minus TIA, however, all of his symptoms improved with hydration more than likely his mental status change secondary to dehydration from over diuresis.  At this time, the patient is at his baseline level of function and cleared for discharge to home.     Deirdre Peer. Ashante Yellin, M.D.     RDP/MEDQ  D:  05/05/2010  T:  05/05/2010  Job:  [id number]  Electronically Signed by Windy Fast Janifer Gieselman M.D. on 05/19/2010 02:43:18 PM

## 2010-05-19 NOTE — Assessment & Plan Note (Signed)
[facility]                            CARDIOLOGY OFFICE NOTE   Aaron Johnson, Aaron Johnson                       MRN:          [id number]  DATE:05/04/2007                            DOB:          [birth year]    Aaron Johnson is a pleasant gentleman who has a history of aortic valve  replacement with a pericardial tissue valve.  His most recent  echocardiogram was performed on February 03, 2006.  His LV function was  normal, and there was mild LVH.  There was a bioprosthetic aortic valve.  There was mild aortic stenosis and mild aortic insufficiency.  There was  mild aortic root dilatation.  There was mild left atrial enlargement.  Note, at the time of his aortic valve replacement a catheterization in  1999 showed no obstructive disease prior to the procedure.   Since I last saw him he is having problems with dizziness with standing.  His blood pressure systolic is running in the 98-100 range.  He denies  any increased dyspnea.  He occasionally feels a brief pain in his chest  after eating, but does not have exertional chest pain.   MEDICATIONS INCLUDE:  1. Advair.  2. Lipitor 20 mg daily.  3. Atrovent.  4. Cozaar 50 mg daily.  5. Norvasc 5 mg daily.  6. Aggrenox.  7. Lasix 10 mg daily.  8. Potassium 20 mEq mg p.o. b.i.d.   PHYSICAL EXAM:  Today shows a blood pressure of 98/60.  His pulse is 79.  HEENT:  Normal.  NECK:  Supple.  CHEST:  Clear.  CARDIOVASCULAR EXAM:  Reveals a regular rate and rhythm.  There is a 2/6  systolic murmur in the left sternal border.  ABDOMINAL EXAM:  Shows no tenderness.  EXTREMITIES:  Show no edema.   ELECTROCARDIOGRAM:  Shows a sinus rhythm at a rate of 79.  There is a  first-degree AV block.  A prior septal infarct cannot be excluded.   DIAGNOSES:  1. 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  We      will plan to repeat his echocardiogram, and he will continue with      subacute bacterial  endocarditis prophylaxis.  2. History of normal coronary arteries at the time of his valve      replacement.  3. Prior cerebrovascular accident.  4. History of cough with ACE inhibition.  5. Hypertension.  His blood pressure is running somewhat low, and he      is having symptoms of dizziness with standing.  I would discontinue      his Norvasc.  Note, we will have his most recent BMET forwarded to      [place] for our records given his diuretic use, ARB use, and potassium      use.  6. Hyperlipidemia.  We will have his most recent lipids and liver      forwarded to [place] for our records.  He will continue on his Lipitor.   We will see him back in 12 months or sooner if necessary.     Aaron Johnson Aaron Som, MD,  [facility]  Electronically Signed    BSC/MedQ  DD: 05/04/2007  DT: 05/04/2007  Job #: [id number]   cc:   Aaron Johnson, M.D.

## 2010-05-22 NOTE — Assessment & Plan Note (Signed)
[facility]                            CARDIOLOGY OFFICE NOTE   RICHEY, DOOLITTLE                       MRN:          [id number]  DATE:01/24/2006                            DOB:          10/19/1926    Aaron Johnson returns for followup today.  He has a history of aortic  valve replacement with a pericardial tissue valve.  Since I last saw  him, there is no dyspnea, chest pain, palpitations, or syncope.  He  apparently has been evaluated for potential seizures.  There is  occasional mild pedal edema.   CURRENT MEDICATIONS:  1. Advair.  2. Lasix 20 mg p.o. daily.  3. Potassium 20 mEq p.o. daily.  4. Lipitor 20 mg p.o. daily.  5. Atrovent.  6. Cozaar 50 mg p.o. daily.  7. Norvasc 5 mg p.o. daily.   PHYSICAL EXAMINATION:  VITAL SIGNS:  Physical exam today shows a blood  pressure of 130/80, and his pulse is 80.  He weighs 170 pounds.  NECK:  Supple with no bruits.  CHEST:  Clear.  CARDIOVASCULAR:  Regular rate and rhythm.  There is a 1/6 systolic  ejection murmur at the left sternal border.  There is no diastolic  murmur.  EXTREMITIES:  Trace edema.   Electrocardiogram shows a sinus rhythm at a rate of 80.  There are  occasional PVCs, and nonspecific changes are noted.  There is poor R  wave progression.   DIAGNOSES:  1. Status post aortic valve replacement with a pericardial tissue      valve.  2. History of normal coronary arteries at the time of his valve      replacement.  3. Prior cerebrovascular accident.  4. History of cough with angiotensin-converting enzyme inhibition.  5. Hypertension.  6. Hyperlipidemia.   PLAN:  Aaron Johnson is doing well from a symptomatic standpoint.  We will  check a CMET today as well as lipids and adjust his medications as  indicated.  I will also repeat an echocardiogram  to follow up on his aortic valve.  He will continue with SBE  prophylaxis.  He will see [place] back in 12 months.     Aaron Johnson  Aaron Som, MD, [facility]  Electronically Signed    BSC/MedQ  DD: 01/24/2006  DT: 01/24/2006  Job #: [id number]   cc:   Devra Dopp, MD

## 2010-05-22 NOTE — Procedures (Signed)
CLINICAL INFORMATION:  This 77 year old patient with a history of stroke  in 1968 is being evaluated for spells in which his eyes would roll back  and he would have shaking of all of his extremities.   TECHNICAL DESCRIPTION:  This EEG was recorded during the awake state.  The background activity shows inner mixture rhythms with low voltage  fast beta activity present interrupted with occasional alpha rhythms.  There is no evidence of any focal asymmetry present during this study.  There is no evidence of any stage II sleep present.  The photic  stimulation testing was performed which did produce a drive response in  the occipital head regions.  Hyperventilation testing produced no  significant abnormalities.  Muscle artifact was seen in the anterior  head regions.   IMPRESSION:  Normal EEG during the awake state.  Should the possibility  of epilepsy be sought, then consideration of a sleep deprived EEG may be  appropriate.           ______________________________  Genene Churn. Sandria Manly, M.D.     ZOX:WRUE  D:  12/24/2005 14:10:35  T:  12/25/2005 15:22:06  Job #:  [id number]

## 2010-05-22 NOTE — Discharge Summary (Signed)
NAME:  Aaron Johnson, Aaron Johnson                ACCOUNT NO.:  [id number]   MEDICAL RECORD NO.:  [id number]          PATIENT TYPE:  INP   LOCATION:  4711                         FACILITY:  [facility]   PHYSICIAN:  Wayne A. Sheffield Slider, M.D.    DATE OF BIRTH:  1926/10/15   DATE OF ADMISSION:  04/18/2005  DATE OF DISCHARGE:  04/18/2005                                 DISCHARGE SUMMARY   DISCHARGE DIAGNOSES:  1.  Angioedema.  2.  Dilutional anemia secondary to poor lab blood draw.  3.  Hypertension.  4.  Iron-deficiency anemia.  5.  Coronary artery disease.  6.  Aortic valve insufficiency.  7.  Congestive heart failure__________ to be right sided.  8.  Chronic obstructive pulmonary disease.  9.  History of a cerebrovascular accident.  10. Colon polyps.  11. Hypercholesterolemia.  12. History of Helicobacter pylori status post treatment.   PAST SURGICAL HISTORY:  Colonoscopy March 2005 showed polyps and mild  gastritis.  He had a history of H. pylori and was status post treatment.   PRIMARY MEDICAL DOCTOR:  Dr. Devra Dopp   DISCHARGE MEDICATIONS:  1.  Advair Diskus 250/50 b.i.d.  2.  Aggrenox one tablet p.o. b.i.d.  3.  Atrovent two puffs q.i.d.  4.  Cozaar 50 mg daily (this is a change from his __________  but is his      current home dose).  5.  K-Dur 20 mEq daily.  6.  Lasix 20 mg daily.  7.  Lipitor 20 mg p.o. daily (this is a change from his__________  but his      new home dose).  8.  Norvasc 5 mg daily (this is a change from his __________  as well).  9.  Alphagan eye drops each eye one drop b.i.d.  10. Cosopt one drop each eye b.i.d.  11. Travatan one drop each eye daily.   ALLERGIES:  NO KNOWN DRUG ALLERGIES.   BRIEF HISTORY:  The patient is a 77 year old African-American male who  presented to [facility] ED for history of angioedema of the left interior  lower lip x1 day and a history of diarrhea x3 with nausea and vomiting x1  day.  It was noted that the patient had a hemoglobin of  6.4 drawn in the ED  and subsequently was admitted secondary for concern for hemoglobin dropping  from April 12, 2005 of 12.1 down to 6.4 on April 18, 2005.  Please see the  dictated H&P for further details.   BRIEF HOSPITAL COURSE:  The patient was admitted overnight secondary to  abnormal lab values.  He was placed in a room at 2:30 a.m.  It was decided  that the patient will be kept overnight for further clarification of his lab  studies and for his angioedema.   Problem 1. ANGIOEDEMA.  This is most likely secondary to the patient taking  a medicine Imodium and this is the only recent new medicine besides his  prednisone which is less likely to cause angioedema.  The patient was given  Benadryl 25 mg p.o. q.6 h. p.r.n. to decrease  swelling.  There is no concern  for__________  projection at this time.  The patient is currently on an ARB  however he has been on this medicine for 1 year.  This will need to be  followed up.  Also, if the angioedema continues to persist I might consider  checking C3, C4 levels for further etiologies of angioedema.   Problem 2. DILUTIONAL PANCYTOPENIA.  This is concernfirst enough to be  admission however it was secondary to a lab error from the lab techs drawing  blood draws for CBCs and CMETs distal to the IV fluid peripheral IV which  resulted in pancytopenia across the board.  White blood cell count 3.2,  hemoglobin 6.4 and platelets of 70 which baseline on repeat was noted to be  white count of 6.7, hemoglobin of 12.7, and platelets of 110 which is more  consistent with his results on April 12, 2005.  The patient was admitted,  given some IV fluid hydration, he was not transfused.  The patient's labs  were rechecked this morning which verified that he was not anemic.  It was  noted that the patient was__________  negative.  The patient will not need  further followup at this time.   Problem 3. CONGESTIVE HEART FAILURE.  We will continue on his  home  medicines.  He did have a chest x-ray which showed no acute active pulmonary  disease.  He received 75 mL of fluids while here for approximately 1 hour.   Problem 4. HISTORY OF CEREBROVASCULAR ACCIDENT.  The patient is on Aggrenox  and it will be continued on discharge.   Problem 5. CORONARY ARTERY DISEASE.  Stable.   Problem 6. HYPERTENSION.  Stable.  Continue on his Cozaar and Norvasc.   Problem 7. CHRONIC OBSTRUCTIVE PULMONARY DISEASE.  We will continue on his  home meds.   Problem 8. CODE.  He was a Full Code.   LABORATORIES AT DISCHARGE:  Hemoglobin was noted to be 12.7 and 11.9,  hematocrit 36.1, white blood cell count 6.7, glucose of 110, sodium was 141,  potassium 3.4, he was replaced with 40 mEq of K-Dur at the time of  discharge, chloride of 113, bicarb of 20, BUN 21, creatinine of 1.1, glucose  of 158, platelets were 110, PT was 14.5, INR was 1.1, AST was 18, ALT was  25, alkaline phosphatase was 13, T-bili was 0.7, albumin was 3.4.  Chest x-  ray showed no acute disease.   The patient will be discharged home.  He will not need hospital followup at  this time.  If his angioedema does continue he may follow up with Dr.  Devra Dopp on a p.r.n. basis.  The patient understands.  He has reached  maximum benefit from hospitalization and is about ready for discharge.      Barth Kirks, M.D.    ______________________________  Arnette Norris. Sheffield Slider, M.D.    MB/MEDQ  D:  04/18/2005  T:  04/18/2005  Job:  [id number]   cc:   Devra Dopp, MD  Fax: [phone]

## 2010-05-22 NOTE — H&P (Signed)
NAME:  Aaron Johnson, Aaron Johnson                ACCOUNT NO.:  [id number]   MEDICAL RECORD NO.:  [id number]          PATIENT TYPE:  INP   LOCATION:  4711                         FACILITY:  [facility]   PHYSICIAN:  Wayne A. Sheffield Slider, M.D.    DATE OF BIRTH:  27-Oct-1926   DATE OF ADMISSION:  04/17/2005  DATE OF DISCHARGE:  04/18/2005                                HISTORY & PHYSICAL   CHIEF COMPLAINT:  Anemia and angioedema.   HISTORY OF PRESENT ILLNESS:  Patient is a 77 year old African-American male  who presented to the ER with angioedema with a less than one day history of  diarrhea x3, nausea and vomiting x1 day.  He had positive sick contacts with  a granddaughter with viral gastroenteritis on April 11.  The patient began  having symptoms on April 14.  Patient was given Imodium x1.  No other new  meds.  He had angioedema x1 day.  No history of hematemesis.  Vomit was  slightly red-tinged but no history of melena or bright red blood per rectum.  The patient does have a history of gastritis with H. pylori, status post  treatment in 2005 with colonoscopy for polyps.  There has been no airway  tightening, no difficulty breathing, no itchy throat.  Patient denies any  abdominal pain.  He was recently in the ED and treated with prednisone for  COPD exacerbation on April 12, 2005.   REVIEW OF SYSTEMS:  No fevers, sweating or chills.  No chest pain, shortness  of breath, cough.  Positive vomiting.  Positive diarrhea.  No abdominal  pain.  No dysuria.  No rashes.  Otherwise, the remainder of review of  systems is unremarkable.   PAST MEDICAL HISTORY:  1.  Iron-deficiency anemia.  2.  Hypertension.  3.  Coronary arterial sclerosis.  4.  Aortic valve insufficiency.  5.  CHF ejection fraction of greater than or equal to 50%.  6.  COPD.  7.  Status post CVA.  8.  History of colon polyps.  9.  History of cholesteremia.   HOME MEDICATIONS:  1.  Advair discus 250/50 b.i.d.  2.  Aggrenox b.i.d.  3.  Atrovent  2 puffs q.i.d.  4.  Cozaar 50 mg daily.  5.  K-Dur 20 mEq daily.  6.  Lasix 20 mEq daily.  7.  Lipitor 20 mg daily.  8.  Norvasc 5 mg daily.  9.  He has been taking 40 mg of prednisone daily.  10. Alphagan eye drops 1 drop per each b.i.d.  11. Cosopt eye drops 1 drop b.i.d., both eyes.  12. Travatan eye drops 1 drop both eyes daily.   PROCEDURE:  Patient has a history of colonoscopy done in March, 2005 that  showed adenomatous polyps and mild gastritis.   SOCIAL HISTORY:  He is married to [place].  Remote history of tobacco abuse.  He does drink one 32 ounce beer usually daily or every other day.  He lives  with his daughter.  His children are involved in his life.  He denies any  smoking currently.  No drug abuse.  PHYSICAL EXAMINATION:  VITAL SIGNS:  Temp 98.6, heart rate 86, blood  pressure 106/67, respirations 18, sats 97% on room air.  GENERAL APPEARANCE:  He is in no acute distress.  MENTAL STATUS:  Alert and oriented x3.  HEENT:  Normocephalic and atraumatic.  Pupils are equal and reactive to  light bilaterally.  Nares are not examined.  Mouth and throat:  He had  positive angioedema of the lower lip.  No oral lesions noted.  LUNGS:  He had decreased air movement throughout but no wheezes, rhonchi, or  shortness of breath.  Good saturations of 97% on room air.  HEART:  Regular rate and rhythm.  No murmurs, rubs or gallops.  ABDOMEN:  Distended but nontender, soft.  No hepatosplenomegaly.  Positive  bowel sounds.  EXTREMITIES:  He had no edema.  There was left-sided weakness, chronic,  status post CVA.  He had +2 pulses bilaterally.  GU:  Fecal occult blood negative.  No masses.  No external hemorrhoids.  NEUROLOGIC:  Cranial nerves II-XII are intact except for XI.  He is unable  to shrug his shoulders secondary to old CVA.  His strength is 4/5 on his  left lower extremity, 5/5 in the right lower extremity.  Unable to raise his  left upper extremity and 3/5 with left upper  extremity weakness.  Balance  and gait were not evaluated.  SKIN/ADENOPATHY:  Negative.   LAB TESTS:  Hemoglobin 6.4, white count 3.2, platelets 70 with a hematocrit  of 19.8.  This is done from a blood draw at 7:30.  Repeat blood draw was at  2030 and was noted to be a hemoglobin of 12.7.  There was another repeat  blood draw at 2200, which also showed 12.7.  There was also another repeat  hemoglobin that showed a blood count at 6.4.  The patient was fecal occult  blood negative.   ASSESSMENT/PLAN:  This is a 77 year old African-American male with suspected  pancytopenia and angioedema, vomiting or diarrhea.  Problem 1:  Pancytopenia:  There is concern for viral etiology versus drug  reaction.  All of his blood cell lines were within normal back on April 9  with a hemoglobin of 12.1, white count 7.9, and platelets were clumped with  a hematocrit of 36.9.  The patient is suspicious to have a viral  gastroenteritis, and the only new medicine since April 9 is prednisone.  We  did consider __________ and infectious causes, and we were going to check  iron studies; however, we were informed at 2:30 a.m. on April 18, 2005 that  there were multiple lab errors secondary to labs being drawn from the right  arm, which was down from his IV fluids, and this was noted to be dilutional,  which would account for his decrease in white blood cell count, hemoglobin,  hematocrit, and platelet count; however, this was not revealed until the  patient was already in her room on the floor at 2:30 in the morning.  Repeat  CBC was performed at 2200 hours, which showed a hemoglobin of 12.7, platelet  count 125,000, and a white count of 6.5.  This was discussed with the lab  tech.  His pancytopenia is most likely secondary to dilutional state  secondary from poor choice of lab draws.  We will follow him up in the a.m. and recheck a CBC in the morning to verify his 12.4 is more appropriate than  12.1 on April 12, 2005.  If this is  stable, we will DC him home in the a.m.  Problem 2:  Angioedema:  The patient was taking prednisone and Imodium x1,  which is a new medicine.  We will give him Benadryl 25 mg p.o. q.6h. for  swelling; however, there is no component of his airway at this time.  He is  not symptomatic.  Problem 3:  Congestive heart failure:  We are going to check a chest x-ray  and baseline CHF for BNP.  Monitor his I's and O's closely.  Problem 4:  History of cerebrovascular accident:  The patient is on  Aggrenox.  We were holding it secondary to the concern for GI causes;  however, now with no signs of anemia, we will continue his medicines.  Problem 5:  Coronary artery disease:  We will check an EKG, place on  telemetry.  Problem 6:  Hypertension:  He is stable.  We were holding his Norvasc and  Cozaar and Norvasc because his blood pressures were stable.  We will restart  them in the morning.  Problem 7:  History of cerebrovascular accident:  There are no acute  changes.  Problem 8:  Chronic obstructive pulmonary disease:  We will continue his  home meds of Advair and Atrovent.  Problem 9:  He is a full code.  Problem 10:  Deep venous thrombosis prophylaxis:  He is on Protonix and  SCDs.      Barth Kirks, M.D.    ______________________________  Arnette Norris. Sheffield Slider, M.D.    MB/MEDQ  D:  04/18/2005  T:  04/18/2005  Job:  [id number]

## 2010-06-02 NOTE — H&P (Signed)
NAME:  CHALLEN, SPAINHOUR                ACCOUNT NO.:  [id number]  MEDICAL RECORD NO.:  [id number]           PATIENT TYPE:  E  LOCATION:  [facility]                         FACILITY:  [facility]  PHYSICIAN:  Michiel Cowboy, MDDATE OF BIRTH:  11-12-1926  DATE OF ADMISSION:  05/02/2010 DATE OF DISCHARGE:                             HISTORY & PHYSICAL   PRIMARY CARE PROVIDER:  Deirdre Peer. Polite, MD  CHIEF COMPLAINTS:  Confusion, difficulty walking.  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  He lives at home with his daughter.  His daughter noticed that for the past couple of days, he had trouble falling asleep at night.  Last night, he started to be confused during the night, talking out of his head in the morning and noticed that his behavior was changed.  He did not do things which he use to do the right way.  Did not like to sit on the chair that he liked, did not wish to have the food that he usually likes.  They did notice also that when he walked, he had trouble walking, almost shaky, alert to walk.  He seemed to be weak on his right side, which is new for him.  Eventually, we decided to go ahead and bring him into the emergency department.  CT scan was unremarkable.  It was not a code stroke because patient's symptoms started the day before.  He continued to be somewhat altered and confused, although appears to be with normal strength on the right. The patient is unable to provide much of review of systems.  He is still very confused, oriented only to self, not to situation.  Per the family, he has not had any recent fevers or chills or cough or any other complaints.  PAST MEDICAL HISTORY:  Significant for: 1. CVA in 1969. 2. Hypercholesteremia. 3. Hypertension. 4. Chronic lower extremity edema. 5. Aortic valve replacement with porcine valve.  SOCIAL HISTORY:  The patient lives with his daughter.  Does not smoke or drink or  abuse drugs.  FAMILY HISTORY:  Noncontributory.  ALLERGIES:  None.  MEDICATIONS: 1. Aggrenox 1 tablet twice a day. 2. Cozaar unknown dose daily. 3. Lasix 20 mg as needed, he only takes when he has more severe edema,     daily his daughter has been trying to give him Lasix. 4. Lipitor unknown dose daily. 5. Potassium chloride, he only takes it when he takes Lasix. 6. He is also on eyedrops, but daughter does not remember name.  PHYSICAL EXAMINATION:  VITALS:  Temperature 98.0, blood pressure 116/62, pulse 69, respirations 22, satting 98% on room air. GENERAL:  The patient appears to be currently in no acute distress. HEENT:  Head nontraumatic.  Slightly dry mucous membranes, somewhat decreased skin turgor. LUNGS:  The patient is not taking deep breaths, but I do not appreciate any crackles. HEART:  Regular rate and rhythm.  No murmurs appreciated. ABDOMEN:  Soft, nontender, and nondistended. EXTREMITIES:  Lower extremities, trace edema bilaterally which per family is actually somewhat improved. NEUROLOGIC:  He has left hemiparesis, which  is old.  He has right facial droop, which is old.  Strength on the right appears to be intact in upper and lower extremity.  The patient is not oriented, but is alert, not following cranial nerve exam.  LABORATORY DATA:  White blood cell count 9.1, hemoglobin 10.8.  Sodium 131, potassium 3.9, creatinine 1.5.  LFTs within normal limits.  Ammonia Utox, alcohol level and UA, all within normal limits.  CT scan of the head showing old right frontal lobe stroke, but nothing new.  Chest x- ray showing old valve replacement.  Otherwise unremarkable.  EKG showed normal sinus rhythm, heart rate in 70s, occasional PVC, appear to have first-degree AV block.  ASSESSMENT AND PLAN:  The is a 77 year old gentleman with altered mental status, possibly metabolic encephalopathy secondary to mild dehydration, question transient ischemic attack. 1. Question  transient ischemic attack.  We will admit to 3000 and do     TIA workup, obtain MRI/MRA in the morning.  Continue on Aggrenox.     For completion sake, we will cycle cardiac markers because the     patient is unable to provide much of any history.  Obtain 2-D echo     Dopplers. 2. Altered mental status/encephalopathy, likely related to     dehydration.  The patient is hyponatremic.  He has somewhat     decreased skin turgor.  He does have some peripheral lower     extremity edema, which per family has been improved since being on     aggressive Lasix.  It looks like he has been slightly over diuresed     at home.  We will give gentle IV fluids and hold Lasix.  His     creatinine is 1.5, I do not know what his baseline is.  He will     have a transient ischemic attack workup. 3. Slight dehydration.  See above.  Give IV fluids.  Hold Lasix. 4. Elevated creatinine, possibly acute renal failure, unknown     baseline.  For now, we will give IV fluids, hold Cozaar. 5. History of aortic valve repair, stable.  It is porcine valve does     not require Coumadin. 6. Prophylaxis, Protonix and sequential compression devices.CODE STATUS:  The patient is full code.     Michiel Cowboy, MD     AVD/MEDQ  D:  05/02/2010  T:  05/02/2010  Job:  [id number]  cc:   Deirdre Peer. Polite, M.D.  Electronically Signed by Therisa Doyne MD on 06/02/2010 05:51:53 AM

## 2010-07-09 ENCOUNTER — Encounter: Payer: Self-pay | Admitting: Cardiology

## 2010-09-18 ENCOUNTER — Other Ambulatory Visit: Payer: Self-pay | Admitting: Internal Medicine

## 2010-09-18 DIAGNOSIS — I679 Cerebrovascular disease, unspecified: Secondary | ICD-10-CM

## 2010-09-21 ENCOUNTER — Other Ambulatory Visit: Payer: PRIVATE HEALTH INSURANCE

## 2010-09-23 ENCOUNTER — Ambulatory Visit
Admission: RE | Admit: 2010-09-23 | Discharge: 2010-09-23 | Disposition: A | Discharge status: Home / Self Care | Payer: PRIVATE HEALTH INSURANCE | Source: Ambulatory Visit | Attending: Internal Medicine | Admitting: Internal Medicine

## 2010-09-23 DIAGNOSIS — I679 Cerebrovascular disease, unspecified: Secondary | ICD-10-CM

## 2011-01-22 ENCOUNTER — Ambulatory Visit: Payer: PRIVATE HEALTH INSURANCE | Admitting: Cardiology

## 2011-01-27 ENCOUNTER — Encounter: Payer: Self-pay | Admitting: Cardiology

## 2011-01-27 ENCOUNTER — Ambulatory Visit (INDEPENDENT_AMBULATORY_CARE_PROVIDER_SITE_OTHER): Payer: PRIVATE HEALTH INSURANCE | Admitting: Cardiology

## 2011-01-27 VITALS — BP 100/70 | HR 70 | Ht 65.0 in | Wt 156.0 lb

## 2011-01-27 DIAGNOSIS — I359 Nonrheumatic aortic valve disorder, unspecified: Secondary | ICD-10-CM

## 2011-01-27 DIAGNOSIS — E785 Hyperlipidemia, unspecified: Secondary | ICD-10-CM

## 2011-01-27 NOTE — Assessment & Plan Note (Signed)
Continue statin. Lipids and liver monitored by primary care. 

## 2011-01-27 NOTE — Assessment & Plan Note (Signed)
Blood pressure controlled. 

## 2011-01-27 NOTE — Progress Notes (Signed)
HPI:Aaron Johnson is a pleasant gentleman who has a history of aortic valve replacement with a pericardial tissue valve.  His most recent echocardiogram was performed in Dec 2011.  His LV function was normal. There was a bioprosthetic aortic valve. There was mild aortic insufficiency and mildly elevated gradient (mean gradient of 25 mmHg); mild biatrial enlargement.  Note, at the time of his aortic valve replacement a catheterization in 1999 showed no obstructive disease prior to the procedure.I last saw the patient in Dec 2011. Since then he has dyspnea with activities but not at rest. No orthopnea, PND, pedal edema, chest pain or syncope.  Current Outpatient Prescriptions  Medication Sig Dispense Refill  . aspirin 81 MG tablet Take 81 mg by mouth daily.      Marland Kitchen atorvastatin (LIPITOR) 40 MG tablet Take 40 mg by mouth daily.      . clopidogrel (PLAVIX) 75 MG tablet Take 75 mg by mouth daily.      . Fluticasone-Salmeterol (ADVAIR DISKUS) 250-50 MCG/DOSE AEPB Inhale 2 puffs into the lungs daily.        . furosemide (LASIX) 20 MG tablet Take 20 mg by mouth daily as needed.        . Multiple Vitamin (MULTIVITAMIN) tablet Take 1 tablet by mouth daily.        Marland Kitchen oxyCODONE-acetaminophen (PERCOCET) 5-325 MG per tablet Take 1 tablet by mouth every 4 (four) hours as needed.        . polyethylene glycol powder (MIRALAX) powder Take by mouth as needed.        . potassium chloride SA (K-DUR,KLOR-CON) 20 MEQ tablet Take by mouth. UAD       . solifenacin (VESICARE) 5 MG tablet Take 10 mg by mouth daily.           Past Medical History  Diagnosis Date  . Acquired musculoskeletal deformity of unspecified site   . Chronic diastolic heart failure     EF >= 50%  . Aortic valve disorders     insufficiency  . Heart valve replaced by other means     aortic  . HTN (hypertension), benign     systematic  . Pure hypercholesterolemia   . COPD (chronic obstructive pulmonary disease)   . Benign neoplasm of colon     . 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 Iron deficiency anemia, unspecified   . LFTs abnormal 3/06    LFTs wnl, Cr - 1.3 stable. 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 colonoscopy 3/05- adenomatous polyps, mild gastritis-diffuse interosseous skeletal hypertosis (DISH), h/o H. pylori s/p tx.    Past Surgical History  Procedure Date  . Aortic valve replacment     with pericardial tissue valve  . Cardiac catheterization 1/99    mild CAD; EF 35-40%    History   Social History  . Marital Status: Divorced    Spouse Name: N/A    Number of Children: N/A  . Years of Education: N/A   Occupational History  . Not on file.   Social History Main Topics  . Smoking status: Former Games developer  . Smokeless tobacco: Not on file   Comment: remote h/ tobacco abuse   . Alcohol Use: Not on file  . Drug Use: Not on file  . Sexually Active: Not on file   Other Topics Concern  . Not on file   Social  History Narrative   Married to Nicole Cella; lives with daughter. Children involved in his life.     ROS: no fevers or chills, productive cough, hemoptysis, dysphasia, odynophagia, melena, hematochezia, dysuria, hematuria, rash, seizure activity, orthopnea, PND, pedal edema, claudication. Remaining systems are negative.  Physical Exam: Well-developed well-nourished in no acute distress.  Skin is warm and dry.  HEENT is normal.  Neck is supple. No thyromegaly.  Chest is clear to auscultation with normal expansion.  Cardiovascular exam is regular rate and rhythm. 2/6 systolic murmur left sternal border. Abdominal exam nontender or distended. No masses palpated. Extremities show no edema. neuro residual left-sided weakness from previous CVA.  ECG sinus rhythm at a rate of 70. First degree AV block. Left axis deviation. Left ventricular hypertrophy. Nonspecific ST changes. Cannot rule  out prior septal infarct.

## 2011-01-27 NOTE — Patient Instructions (Signed)

## 2011-01-27 NOTE — Assessment & Plan Note (Signed)
Continue SBE prophylaxis. Repeat echocardiogram. 

## 2011-02-03 ENCOUNTER — Other Ambulatory Visit (HOSPITAL_COMMUNITY): Payer: PRIVATE HEALTH INSURANCE | Admitting: Radiology

## 2011-02-10 ENCOUNTER — Ambulatory Visit (HOSPITAL_COMMUNITY): Payer: PRIVATE HEALTH INSURANCE | Attending: Cardiology | Admitting: Radiology

## 2011-02-19 ENCOUNTER — Other Ambulatory Visit: Payer: Self-pay

## 2011-02-19 ENCOUNTER — Ambulatory Visit (HOSPITAL_COMMUNITY): Payer: PRIVATE HEALTH INSURANCE | Attending: Cardiovascular Disease | Admitting: Radiology

## 2011-02-19 DIAGNOSIS — J4489 Other specified chronic obstructive pulmonary disease: Secondary | ICD-10-CM | POA: Insufficient documentation

## 2011-02-19 DIAGNOSIS — I10 Essential (primary) hypertension: Secondary | ICD-10-CM | POA: Insufficient documentation

## 2011-02-19 DIAGNOSIS — I359 Nonrheumatic aortic valve disorder, unspecified: Secondary | ICD-10-CM | POA: Insufficient documentation

## 2011-02-19 DIAGNOSIS — E785 Hyperlipidemia, unspecified: Secondary | ICD-10-CM | POA: Insufficient documentation

## 2011-02-19 DIAGNOSIS — J449 Chronic obstructive pulmonary disease, unspecified: Secondary | ICD-10-CM | POA: Insufficient documentation

## 2011-02-19 DIAGNOSIS — Z8673 Personal history of transient ischemic attack (TIA), and cerebral infarction without residual deficits: Secondary | ICD-10-CM | POA: Insufficient documentation

## 2011-06-23 ENCOUNTER — Observation Stay (HOSPITAL_COMMUNITY)
Admission: EM | Admit: 2011-06-23 | Discharge: 2011-06-23 | DRG: 313 | Disposition: A | Discharge status: Home / Self Care | Payer: PRIVATE HEALTH INSURANCE | Attending: Internal Medicine | Admitting: Internal Medicine

## 2011-06-23 ENCOUNTER — Encounter (HOSPITAL_COMMUNITY): Payer: Self-pay

## 2011-06-23 ENCOUNTER — Emergency Department (HOSPITAL_COMMUNITY): Payer: PRIVATE HEALTH INSURANCE

## 2011-06-23 DIAGNOSIS — R079 Chest pain, unspecified: Principal | ICD-10-CM | POA: Diagnosis present

## 2011-06-23 DIAGNOSIS — I5032 Chronic diastolic (congestive) heart failure: Secondary | ICD-10-CM | POA: Diagnosis present

## 2011-06-23 DIAGNOSIS — D126 Benign neoplasm of colon, unspecified: Secondary | ICD-10-CM | POA: Diagnosis present

## 2011-06-23 DIAGNOSIS — Z952 Presence of prosthetic heart valve: Secondary | ICD-10-CM

## 2011-06-23 DIAGNOSIS — E785 Hyperlipidemia, unspecified: Secondary | ICD-10-CM

## 2011-06-23 DIAGNOSIS — I10 Essential (primary) hypertension: Secondary | ICD-10-CM | POA: Diagnosis present

## 2011-06-23 DIAGNOSIS — E7800 Pure hypercholesterolemia, unspecified: Secondary | ICD-10-CM | POA: Diagnosis present

## 2011-06-23 DIAGNOSIS — M959 Acquired deformity of musculoskeletal system, unspecified: Secondary | ICD-10-CM | POA: Diagnosis present

## 2011-06-23 DIAGNOSIS — I509 Heart failure, unspecified: Secondary | ICD-10-CM | POA: Diagnosis present

## 2011-06-23 DIAGNOSIS — D509 Iron deficiency anemia, unspecified: Secondary | ICD-10-CM | POA: Diagnosis present

## 2011-06-23 DIAGNOSIS — I359 Nonrheumatic aortic valve disorder, unspecified: Secondary | ICD-10-CM | POA: Diagnosis present

## 2011-06-23 DIAGNOSIS — Z79899 Other long term (current) drug therapy: Secondary | ICD-10-CM

## 2011-06-23 DIAGNOSIS — I69928 Other speech and language deficits following unspecified cerebrovascular disease: Secondary | ICD-10-CM

## 2011-06-23 DIAGNOSIS — Z87891 Personal history of nicotine dependence: Secondary | ICD-10-CM

## 2011-06-23 DIAGNOSIS — I2510 Atherosclerotic heart disease of native coronary artery without angina pectoris: Secondary | ICD-10-CM | POA: Diagnosis present

## 2011-06-23 DIAGNOSIS — Z7982 Long term (current) use of aspirin: Secondary | ICD-10-CM

## 2011-06-23 DIAGNOSIS — Z954 Presence of other heart-valve replacement: Secondary | ICD-10-CM

## 2011-06-23 HISTORY — DX: Cerebral infarction, unspecified: I63.9

## 2011-06-23 LAB — POCT I-STAT, CHEM 8
Creatinine, Ser: 1.1 mg/dL (ref 0.50–1.35)
HCT: 36 % — ABNORMAL LOW (ref 39.0–52.0)
Hemoglobin: 12.2 g/dL — ABNORMAL LOW (ref 13.0–17.0)
Potassium: 3.9 mEq/L (ref 3.5–5.1)
Sodium: 145 mEq/L (ref 135–145)

## 2011-06-23 LAB — CARDIAC PANEL(CRET KIN+CKTOT+MB+TROPI)
CK, MB: 1.6 ng/mL (ref 0.3–4.0)
Relative Index: INVALID (ref 0.0–2.5)
Total CK: 76 U/L (ref 7–232)
Troponin I: <0.3 ng/mL (ref <0.30)

## 2011-06-23 LAB — DIFFERENTIAL
Eosinophils Relative: 5 % (ref 0–5)
Lymphocytes Relative: 33 % (ref 12–46)
Lymphs Abs: 2.3 10*3/uL (ref 0.7–4.0)

## 2011-06-23 LAB — CBC
HCT: 34.1 % — ABNORMAL LOW (ref 39.0–52.0)
MCH: 27.9 pg (ref 26.0–34.0)
MCV: 87.4 fL (ref 78.0–100.0)
MCV: 87.9 fL (ref 78.0–100.0)
Platelets: 109 10*3/uL — ABNORMAL LOW (ref 150–400)
Platelets: 107 10*3/uL — ABNORMAL LOW (ref 150–400)
RBC: 3.9 MIL/uL — ABNORMAL LOW (ref 4.22–5.81)
RDW: 13.4 % (ref 11.5–15.5)
WBC: 7.1 10*3/uL (ref 4.0–10.5)

## 2011-06-23 LAB — COMPREHENSIVE METABOLIC PANEL
ALT: 15 U/L (ref 0–53)
AST: 15 U/L (ref 0–37)
Albumin: 3.2 g/dL — ABNORMAL LOW (ref 3.5–5.2)
Calcium: 8.5 mg/dL (ref 8.4–10.5)
GFR calc Af Amer: 87 mL/min — ABNORMAL LOW (ref >90)
Glucose, Bld: 95 mg/dL (ref 70–99)
Sodium: 143 mEq/L (ref 135–145)
Total Protein: 6.9 g/dL (ref 6.0–8.3)

## 2011-06-23 LAB — POCT I-STAT TROPONIN I: Troponin i, poc: 0.02 ng/mL (ref 0.00–0.08)

## 2011-06-23 LAB — PROTIME-INR: INR: 1.2 (ref 0.00–1.49)

## 2011-06-23 LAB — APTT: aPTT: 29 seconds (ref 24–37)

## 2011-06-23 MED ORDER — SODIUM CHLORIDE 0.9 % IJ SOLN
3.0000 mL | Freq: Two times a day (BID) | INTRAMUSCULAR | Status: DC
  Administered 2011-06-23: 3 mL via INTRAVENOUS

## 2011-06-23 MED ORDER — ACETAMINOPHEN 325 MG PO TABS: 650.0000 mg | ORAL_TABLET | ORAL | Status: DC | PRN

## 2011-06-23 MED ORDER — TRAZODONE 25 MG HALF TABLET
25.0000 mg | ORAL_TABLET | Freq: Every evening | ORAL | Status: DC | PRN
  Filled 2011-06-23: qty 1

## 2011-06-23 MED ORDER — FLUTICASONE-SALMETEROL 250-50 MCG/DOSE IN AEPB
1.0000 | INHALATION_SPRAY | Freq: Two times a day (BID) | RESPIRATORY_TRACT | Status: DC
  Administered 2011-06-23: 1 via RESPIRATORY_TRACT
  Filled 2011-06-23: qty 14

## 2011-06-23 MED ORDER — SODIUM CHLORIDE 0.9 % IV SOLN: 250.0000 mL | INTRAVENOUS | Status: DC | PRN

## 2011-06-23 MED ORDER — SODIUM CHLORIDE 0.9 % IV SOLN
INTRAVENOUS | Status: AC
  Administered 2011-06-23: 06:00:00 via INTRAVENOUS

## 2011-06-23 MED ORDER — POLYETHYLENE GLYCOL 3350 17 GM/SCOOP PO POWD
17.0000 g | Freq: Every day | ORAL | Status: DC | PRN
Start: 2011-06-23 — End: 2011-06-23

## 2011-06-23 MED ORDER — SODIUM CHLORIDE 0.9 % IV SOLN
Freq: Once | INTRAVENOUS | Status: AC
  Administered 2011-06-23: 02:00:00 via INTRAVENOUS

## 2011-06-23 MED ORDER — FUROSEMIDE 20 MG PO TABS
20.0000 mg | ORAL_TABLET | Freq: Every day | ORAL | Status: DC | PRN
  Filled 2011-06-23: qty 1

## 2011-06-23 MED ORDER — NITROGLYCERIN 0.4 MG SL SUBL: 0.4000 mg | SUBLINGUAL_TABLET | SUBLINGUAL | Status: DC | PRN

## 2011-06-23 MED ORDER — POLYETHYLENE GLYCOL 3350 17 G PO PACK
17.0000 g | PACK | Freq: Every day | ORAL | Status: DC | PRN
  Filled 2011-06-23: qty 1

## 2011-06-23 MED ORDER — OXYCODONE HCL 5 MG PO TABS: 5.0000 mg | ORAL_TABLET | ORAL | Status: DC | PRN

## 2011-06-23 MED ORDER — ONDANSETRON HCL 4 MG/2ML IJ SOLN: 4.0000 mg | INTRAMUSCULAR | Status: DC | PRN

## 2011-06-23 MED ORDER — ACETAMINOPHEN 650 MG RE SUPP: 650.0000 mg | Freq: Four times a day (QID) | RECTAL | Status: DC | PRN

## 2011-06-23 MED ORDER — POTASSIUM CHLORIDE CRYS ER 20 MEQ PO TBCR: 20.0000 meq | EXTENDED_RELEASE_TABLET | Freq: Every day | ORAL | Status: DC | PRN

## 2011-06-23 MED ORDER — ENOXAPARIN SODIUM 40 MG/0.4ML ~~LOC~~ SOLN
40.0000 mg | Freq: Every day | SUBCUTANEOUS | Status: DC
  Administered 2011-06-23: 40 mg via SUBCUTANEOUS
  Filled 2011-06-23: qty 0.4

## 2011-06-23 MED ORDER — ATORVASTATIN CALCIUM 40 MG PO TABS
40.0000 mg | ORAL_TABLET | Freq: Every day | ORAL | Status: DC
  Administered 2011-06-23: 40 mg via ORAL
  Filled 2011-06-23: qty 1

## 2011-06-23 MED ORDER — SODIUM CHLORIDE 0.9 % IJ SOLN: 3.0000 mL | INTRAMUSCULAR | Status: DC | PRN

## 2011-06-23 MED ORDER — DOCUSATE SODIUM 100 MG PO CAPS
100.0000 mg | ORAL_CAPSULE | Freq: Every day | ORAL | Status: DC
  Administered 2011-06-23: 100 mg via ORAL
  Filled 2011-06-23: qty 1

## 2011-06-23 MED ORDER — ASPIRIN EC 81 MG PO TBEC
81.0000 mg | DELAYED_RELEASE_TABLET | Freq: Every day | ORAL | Status: DC
  Administered 2011-06-23: 81 mg via ORAL
  Filled 2011-06-23: qty 1

## 2011-06-23 MED ORDER — CLOPIDOGREL BISULFATE 75 MG PO TABS
75.0000 mg | ORAL_TABLET | Freq: Every day | ORAL | Status: DC
  Administered 2011-06-23: 75 mg via ORAL
  Filled 2011-06-23 (×2): qty 1

## 2011-06-23 NOTE — Progress Notes (Signed)
06/23/2011 1600 Nursing notes Troponin and CKMB results called to DR. Gates. Verbal orders received ok to discharge patient. Orders enacted.  Kortney Schoenfelder, Blanchard Kelch

## 2011-06-23 NOTE — H&P (Signed)
PCP:   Katy Apo, MD   Cardiologist: Olga Millers M.D.  Chief Complaint:  Chest pains this evening  HPI: Aaron Johnson is an 78 y.o. male.   Elderly African American gentleman with history of porcine aortic valve replacement, hypertension and diastolic heart failure. Developed a presternal chest pain while sitting at rest this evening, no radiation no dizziness diaphoresis nor syncope; no palpitations. Unable to elicit how long the chest pain started however his daughter gave him 4 baby aspirin and the pain gradually eased.  Although my understanding is the patient is used to having these type of pains, he is not used to taking nitroglycerin and decided total call ambulance and come to the ER.  Since being in the emergency room the chest pain has not returned, he has no acute EKG, and no biochemical evidence of acute cardiac injury. Because of his multiple risk factors the hospitalist service is been called to assist.   Denies fever cough or cold nausea vomiting or diarrhea  He has chronic right-sided hemiplegia status post stroke  . Rewiew of Systems:  The patient denies anorexia, fever, weight loss,, vision loss, decreased hearing, hoarseness,  syncope, dyspnea on exertion, peripheral edema, balance deficits, hemoptysis, abdominal pain, melena, hematochezia, severe indigestion/heartburn, hematuria, incontinence, genital sores, muscle weakness, suspicious skin lesions, transient blindness, difficulty walking, depression, unusual weight change, abnormal bleeding, enlarged lymph nodes, angioedema, and breast masses.    Past Medical History  Diagnosis Date  . Acquired musculoskeletal deformity of unspecified site   . Chronic diastolic heart failure     EF >= 50%  . Aortic valve disorders     insufficiency  . Heart valve replaced by other means     aortic  . HTN (hypertension), benign     systematic  . Pure hypercholesterolemia   . COPD (chronic obstructive pulmonary  disease)   . Benign neoplasm of colon   . 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 Iron deficiency anemia, unspecified   . LFTs abnormal 3/06    LFTs wnl, Cr - 1.3 stable. 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 colonoscopy 3/05- adenomatous polyps, mild gastritis-diffuse interosseous skeletal hypertosis (DISH), h/o H. pylori s/p tx.    Past Surgical History  Procedure Date  . Aortic valve replacment     with pericardial tissue valve  . Cardiac catheterization 1/99    mild CAD; EF 35-40%    Medications:  HOME MEDS: Prior to Admission medications   Medication Sig Start Date End Date Taking? Authorizing Provider  aspirin 81 MG tablet Take 81 mg by mouth daily.   Yes Historical Provider, MD  atorvastatin (LIPITOR) 40 MG tablet Take 40 mg by mouth daily.   Yes Historical Provider, MD  clopidogrel (PLAVIX) 75 MG tablet Take 75 mg by mouth daily.   Yes Historical Provider, MD  docusate sodium (COLACE) 100 MG capsule Take 100 mg by mouth daily.   Yes Historical Provider, MD  Fluticasone-Salmeterol (ADVAIR DISKUS) 250-50 MCG/DOSE AEPB Inhale 2 puffs into the lungs daily.     Yes Historical Provider, MD  furosemide (LASIX) 20 MG tablet Take 20 mg by mouth daily as needed. For edema   Yes Historical Provider, MD  Multiple Vitamin (MULTIVITAMIN) tablet Take 1 tablet by mouth daily.     Yes Historical Provider, MD  oxyCODONE-acetaminophen (PERCOCET) 5-325 MG per tablet Take 1 tablet by mouth  every 4 (four) hours as needed. For pain   Yes Historical Provider, MD  polyethylene glycol powder (MIRALAX) powder Take 17 g by mouth daily as needed. For constipation   Yes Historical Provider, MD  potassium chloride SA (K-DUR,KLOR-CON) 20 MEQ tablet Take 20 mEq by mouth daily as needed. Takes with lasix for edema   Yes Historical Provider, MD     Allergies:  No Known Allergies  Social  History:   reports that he has quit smoking. He does not have any smokeless tobacco history on file. His alcohol and drug histories not on file.  Family History: Family History  Problem Relation Age of Onset  . Colon cancer Neg Hx   . Heart disease Father      Physical Exam: Filed Vitals:   06/23/11 0015 06/23/11 0108 06/23/11 0303  BP: 136/85 129/62 148/44  Pulse: 67 68 67  Temp: 98.2 F (36.8 C)    TempSrc: Oral    Resp: 19 17 19   SpO2: 100% 100% 99%   Blood pressure 148/44, pulse 67, temperature 98.2 F (36.8 C), temperature source Oral, resp. rate 19, SpO2 99.00%.  GEN:  Pleasant elderly African American gentleman lying in the stretcher in no acute distress; cooperative with exam; speech is somewhat difficult to comprehend maybe because of his old stroke; that his daughter assists with the history PSYCH:  alert and oriented x4; does not appear anxious or depressed; affect is appropriate. HEENT: Mucous membranes pink and anicteric; PERRLA; EOM intact; no cervical lymphadenopathy nor thyromegaly or carotid bruit; no JVD; he is edentulous Breasts:: Not examined CHEST WALL: No tenderness CHEST: Normal respiration, clear to auscultation bilaterally HEART: Regular rate and rhythm; prominent S2 no murmurs rubs or gallops BACK:  no CVA tenderness ABDOMEN: Obese, soft non-tender; no masses, no organomegaly, normal abdominal bowel  Rectal Exam: Not done EXTREMITIES: N age-appropriate arthropathy of the hands and knees; trace edema; no ulcerations. Genitalia: not examined PULSES: 2+ and symmetric SKIN: Normal hydration no rash or ulceration   Labs & Imaging Results for orders placed during the hospital encounter of 06/23/11 (from the past 48 hour(s))  CBC     Status: Abnormal   Collection Time   06/23/11 12:41 AM      Component Value Range Comment   WBC 7.1  4.0 - 10.5 K/uL    RBC 3.90 (*) 4.22 - 5.81 MIL/uL    Hemoglobin 11.1 (*) 13.0 - 17.0 g/dL    HCT 16.1 (*) 09.6 - 52.0  %    MCV 87.4  78.0 - 100.0 fL    MCH 28.5  26.0 - 34.0 pg    MCHC 32.6  30.0 - 36.0 g/dL    RDW 04.5  40.9 - 81.1 %    Platelets 109 (*) 150 - 400 K/uL PLATELET COUNT CONFIRMED BY SMEAR  DIFFERENTIAL     Status: Abnormal   Collection Time   06/23/11 12:41 AM      Component Value Range Comment   Neutrophils Relative 46  43 - 77 %    Neutro Abs 3.2  1.7 - 7.7 K/uL    Lymphocytes Relative 33  12 - 46 %    Lymphs Abs 2.3  0.7 - 4.0 K/uL    Monocytes Relative 17 (*) 3 - 12 %    Monocytes Absolute 1.2 (*) 0.1 - 1.0 K/uL    Eosinophils Relative 5  0 - 5 %    Eosinophils Absolute 0.3  0.0 - 0.7 K/uL  Basophils Relative 0  0 - 1 %    Basophils Absolute 0.0  0.0 - 0.1 K/uL   APTT     Status: Normal   Collection Time   06/23/11 12:41 AM      Component Value Range Comment   aPTT 29  24 - 37 seconds   PROTIME-INR     Status: Abnormal   Collection Time   06/23/11 12:41 AM      Component Value Range Comment   Prothrombin Time 15.5 (*) 11.6 - 15.2 seconds    INR 1.20  0.00 - 1.49   POCT I-STAT TROPONIN I     Status: Normal   Collection Time   06/23/11  1:03 AM      Component Value Range Comment   Troponin i, poc 0.02  0.00 - 0.08 ng/mL    Comment 3            POCT I-STAT, CHEM 8     Status: Abnormal   Collection Time   06/23/11  1:04 AM      Component Value Range Comment   Sodium 145  135 - 145 mEq/L    Potassium 3.9  3.5 - 5.1 mEq/L    Chloride 108  96 - 112 mEq/L    BUN 22  6 - 23 mg/dL    Creatinine, Ser 1.61  0.50 - 1.35 mg/dL    Glucose, Bld 096 (*) 70 - 99 mg/dL    Calcium, Ion 0.45  4.09 - 1.32 mmol/L    TCO2 24  0 - 100 mmol/L    Hemoglobin 12.2 (*) 13.0 - 17.0 g/dL    HCT 81.1 (*) 91.4 - 52.0 %    Dg Chest Port 1 View  06/23/2011  *RADIOLOGY REPORT*  Clinical Data: New onset of chest pain.  PORTABLE CHEST - 1 VIEW  Comparison: Chest radiograph performed 05/02/2010  Findings: The lungs are mildly hypoexpanded; mild bibasilar opacities likely reflect atelectasis.  No pleural  effusion or pneumothorax is seen.  The cardiomediastinal silhouette is mildly enlarged; the patient is status post median sternotomy.  No acute osseous abnormalities are seen.  Mild degenerative change is noted at the right glenohumeral joint, and involving the right humeral head.  IMPRESSION: Lungs mildly hypoexpanded; mild bibasilar airspace opacities likely reflect atelectasis.  Mild cardiomegaly noted.  Original Report Authenticated By: Tonia Ghent, M.D.      Assessment Present on Admission:  .Chest pain .HYPERLIPIDEMIA .HYPERTENSION, BENIGN SYSTEMIC .CORONARY, ARTERIOSCLEROSIS .CHF, EJECTION FRACTION > OR = 50% .CEREBROVAS DIS, LATE EFFECTS (S/P CVA)   PLAN: We'll admit this gentleman on a cardiac rule out; His primary care physician will assume care later this morning, and will defer to him as to whether he wishes the patient to see the cardiologist as an inpatient or outpatient.  Other plans as per orders.    Dmarcus Decicco 06/23/2011, 3:47 AM

## 2011-06-23 NOTE — Progress Notes (Signed)
06/23/2011 4:57 PM Nursing notes Discharge avs form, medications already taken today and those due this evening given and explained to patient and family member. Follow up appointments and when to call MD discussed. PT. And family member verbalized understanding. D/c iv line. D/c tele. D/c home per orders.

## 2011-06-23 NOTE — Discharge Summary (Signed)
Physician Discharge Summary  Patient ID: Aaron Johnson MRN: [id number] DOB/AGE: 1926/08/30 78 y.o.  Admit date: 06/23/2011 Discharge date: 06/23/2011  Admission Diagnoses:  Discharge Diagnoses:  Active Problems:  HYPERLIPIDEMIA  HYPERTENSION, BENIGN SYSTEMIC  CORONARY, ARTERIOSCLEROSIS  CHF, EJECTION FRACTION > OR = 50%  CEREBROVAS DIS, LATE EFFECTS (S/P CVA)  AORTIC VALVE REPLACEMENT, HX OF  Chest pain   Discharged Condition: stable  Hospital Course:  The patient was brought to the hospital with complaint of chest pain at home. The chest pain resolved upon presentation to the emergency room. EKG without acute changes, initial cardiac enzymes negative. Patient has one set pending if negative he will be discharged to home and continue his current medications. There were no complaints of shortness of breath palpitations there was no hypoxia there was no diaphoresis. Patient's daughter denies any previous issues with reflux however occasionally does eat fast. He will have further outpatient management. Consults:    Significant Diagnostic Studies:Dg Chest Port 1 View  06/23/2011  *RADIOLOGY REPORT*  Clinical Data: New onset of chest pain.  PORTABLE CHEST - 1 VIEW  Comparison: Chest radiograph performed 05/02/2010  Findings: The lungs are mildly hypoexpanded; mild bibasilar opacities likely reflect atelectasis.  No pleural effusion or pneumothorax is seen.  The cardiomediastinal silhouette is mildly enlarged; the patient is status post median sternotomy.  No acute osseous abnormalities are seen.  Mild degenerative change is noted at the right glenohumeral joint, and involving the right humeral head.  IMPRESSION: Lungs mildly hypoexpanded; mild bibasilar airspace opacities likely reflect atelectasis.  Mild cardiomegaly noted.  Original Report Authenticated By: Tonia Ghent, M.Johnson.      Discharge Exam: Blood pressure 160/91, pulse 66, temperature 98.5 F (36.9 C), temperature source Oral,  resp. rate 18, height 5\' 6"  (1.676 m), weight 73.1 kg (161 lb 2.5 oz), SpO2 98.00%. Resp: clear to auscultation bilaterally Cardio: regular rate and rhythm Extremities: extremities normal, atraumatic, no cyanosis or edema  Disposition: 01-Home or Self Care   Medication List  As of 06/23/2011  1:29 PM   TAKE these medications         ADVAIR DISKUS 250-50 MCG/DOSE Aepb   Generic drug: Fluticasone-Salmeterol   Inhale 2 puffs into the lungs daily.      aspirin 81 MG tablet   Take 81 mg by mouth daily.      atorvastatin 40 MG tablet   Commonly known as: LIPITOR   Take 40 mg by mouth daily.      clopidogrel 75 MG tablet   Commonly known as: PLAVIX   Take 75 mg by mouth daily.      docusate sodium 100 MG capsule   Commonly known as: COLACE   Take 100 mg by mouth daily.      furosemide 20 MG tablet   Commonly known as: LASIX   Take 20 mg by mouth daily as needed. For edema      MIRALAX powder   Generic drug: polyethylene glycol powder   Take 17 g by mouth daily as needed. For constipation      multivitamin tablet   Take 1 tablet by mouth daily.      oxyCODONE-acetaminophen 5-325 MG per tablet   Commonly known as: PERCOCET   Take 1 tablet by mouth every 4 (four) hours as needed. For pain      potassium chloride SA 20 MEQ tablet   Commonly known as: K-DUR,KLOR-CON   Take 20 mEq by mouth daily as needed. Takes with lasix for  edema           Follow-up Information    Follow up with Aaron Mans D, MD in 2 weeks.   Contact information:   [address] [place] [address] [phone]          Signed: Katy Apo 06/23/2011, 1:29 PM

## 2011-06-23 NOTE — Progress Notes (Signed)
Subjective: Patient alert, oriented, at his baseline level of function. No complaint of chest pain. Admission H&P has been reviewed, patient had chest pain at home at rest there was no associated shortness of breath palpitations diaphoresis patient's pain resolve on its own. There was no tachycardia, no hypoxia. EKG without acute changes, so far cardiac enzymes unremarkable. Patient's daughter is at his bedside. No additional information to add to the story. If no objective evidence of cardiac chest pain, plans for discharge to home.  Objective: Vital signs in last 24 hours: Temp:  [98.2 F (36.8 C)-98.5 F (36.9 C)] 98.5 F (36.9 C) (06/19 0450) Pulse Rate:  [66-68] 66  (06/19 0450) Resp:  [17-19] 18  (06/19 0450) BP: (129-160)/(44-91) 160/91 mmHg (06/19 0450) SpO2:  [96 %-100 %] 96 % (06/19 0450) Weight:  [73.1 kg (161 lb 2.5 oz)] 73.1 kg (161 lb 2.5 oz) (06/19 0450) Weight change:  Last BM Date: 06/21/11  Intake/Output from previous day: 06/18 0701 - 06/19 0700 In: 65.8 [I.V.:65.8] Out: -  Intake/Output this shift:    General appearance: alert and cooperative Resp: clear to auscultation bilaterally Cardio: regular rate and rhythm, Extremities: extremities normal, atraumatic, no cyanosis or edema, Homans sign is negative, no sign of DVT and no edema, redness or tenderness in the calves or thighs  Lab Results:  Results for orders placed during the hospital encounter of 06/23/11 (from the past 24 hour(s))  CBC     Status: Abnormal   Collection Time   06/23/11 12:41 AM      Component Value Range   WBC 7.1  4.0 - 10.5 K/uL   RBC 3.90 (*) 4.22 - 5.81 MIL/uL   Hemoglobin 11.1 (*) 13.0 - 17.0 g/dL   HCT 16.1 (*) 09.6 - 04.5 %   MCV 87.4  78.0 - 100.0 fL   MCH 28.5  26.0 - 34.0 pg   MCHC 32.6  30.0 - 36.0 g/dL   RDW 40.9  81.1 - 91.4 %   Platelets 109 (*) 150 - 400 K/uL  DIFFERENTIAL     Status: Abnormal   Collection Time   06/23/11 12:41 AM      Component Value Range   Neutrophils Relative 46  43 - 77 %   Neutro Abs 3.2  1.7 - 7.7 K/uL   Lymphocytes Relative 33  12 - 46 %   Lymphs Abs 2.3  0.7 - 4.0 K/uL   Monocytes Relative 17 (*) 3 - 12 %   Monocytes Absolute 1.2 (*) 0.1 - 1.0 K/uL   Eosinophils Relative 5  0 - 5 %   Eosinophils Absolute 0.3  0.0 - 0.7 K/uL   Basophils Relative 0  0 - 1 %   Basophils Absolute 0.0  0.0 - 0.1 K/uL  APTT     Status: Normal   Collection Time   06/23/11 12:41 AM      Component Value Range   aPTT 29  24 - 37 seconds  PROTIME-INR     Status: Abnormal   Collection Time   06/23/11 12:41 AM      Component Value Range   Prothrombin Time 15.5 (*) 11.6 - 15.2 seconds   INR 1.20  0.00 - 1.49  POCT I-STAT TROPONIN I     Status: Normal   Collection Time   06/23/11  1:03 AM      Component Value Range   Troponin i, poc 0.02  0.00 - 0.08 ng/mL   Comment 3  POCT I-STAT, CHEM 8     Status: Abnormal   Collection Time   06/23/11  1:04 AM      Component Value Range   Sodium 145  135 - 145 mEq/L   Potassium 3.9  3.5 - 5.1 mEq/L   Chloride 108  96 - 112 mEq/L   BUN 22  6 - 23 mg/dL   Creatinine, Ser 1.61  0.50 - 1.35 mg/dL   Glucose, Bld 096 (*) 70 - 99 mg/dL   Calcium, Ion 0.45  4.09 - 1.32 mmol/L   TCO2 24  0 - 100 mmol/L   Hemoglobin 12.2 (*) 13.0 - 17.0 g/dL   HCT 81.1 (*) 91.4 - 78.2 %  CBC     Status: Abnormal   Collection Time   06/23/11  5:00 AM      Component Value Range   WBC 6.3  4.0 - 10.5 K/uL   RBC 3.80 (*) 4.22 - 5.81 MIL/uL   Hemoglobin 10.6 (*) 13.0 - 17.0 g/dL   HCT 95.6 (*) 21.3 - 08.6 %   MCV 87.9  78.0 - 100.0 fL   MCH 27.9  26.0 - 34.0 pg   MCHC 31.7  30.0 - 36.0 g/dL   RDW 57.8  46.9 - 62.9 %   Platelets 107 (*) 150 - 400 K/uL  MAGNESIUM     Status: Normal   Collection Time   06/23/11  5:00 AM      Component Value Range   Magnesium 2.0  1.5 - 2.5 mg/dL  COMPREHENSIVE METABOLIC PANEL     Status: Abnormal   Collection Time   06/23/11  5:00 AM      Component Value Range   Sodium 143  135  - 145 mEq/L   Potassium 3.2 (*) 3.5 - 5.1 mEq/L   Chloride 110  96 - 112 mEq/L   CO2 24  19 - 32 mEq/L   Glucose, Bld 95  70 - 99 mg/dL   BUN 20  6 - 23 mg/dL   Creatinine, Ser 5.28  0.50 - 1.35 mg/dL   Calcium 8.5  8.4 - 41.3 mg/dL   Total Protein 6.9  6.0 - 8.3 g/dL   Albumin 3.2 (*) 3.5 - 5.2 g/dL   AST 15  0 - 37 U/L   ALT 15  0 - 53 U/L   Alkaline Phosphatase 68  39 - 117 U/L   Total Bilirubin 0.3  0.3 - 1.2 mg/dL   GFR calc non Af Amer 75 (*) >90 mL/min   GFR calc Af Amer 87 (*) >90 mL/min  CARDIAC PANEL(CRET KIN+CKTOT+MB+TROPI)     Status: Normal   Collection Time   06/23/11  5:05 AM      Component Value Range   Total CK 75  7 - 232 U/L   CK, MB 1.6  0.3 - 4.0 ng/mL   Troponin I <0.30  <0.30 ng/mL   Relative Index RELATIVE INDEX IS INVALID  0.0 - 2.5      Studies/Results: Dg Chest Port 1 View  06/23/2011  *RADIOLOGY REPORT*  Clinical Data: New onset of chest pain.  PORTABLE CHEST - 1 VIEW  Comparison: Chest radiograph performed 05/02/2010  Findings: The lungs are mildly hypoexpanded; mild bibasilar opacities likely reflect atelectasis.  No pleural effusion or pneumothorax is seen.  The cardiomediastinal silhouette is mildly enlarged; the patient is status post median sternotomy.  No acute osseous abnormalities are seen.  Mild degenerative change is noted at the right glenohumeral  joint, and involving the right humeral head.  IMPRESSION: Lungs mildly hypoexpanded; mild bibasilar airspace opacities likely reflect atelectasis.  Mild cardiomegaly noted.  Original Report Authenticated By: Tonia Ghent, M.D.    Medications:  Prior to Admission:  Prescriptions prior to admission  Medication Sig Dispense Refill  . aspirin 81 MG tablet Take 81 mg by mouth daily.      Marland Kitchen atorvastatin (LIPITOR) 40 MG tablet Take 40 mg by mouth daily.      . clopidogrel (PLAVIX) 75 MG tablet Take 75 mg by mouth daily.      Marland Kitchen docusate sodium (COLACE) 100 MG capsule Take 100 mg by mouth daily.      .  Fluticasone-Salmeterol (ADVAIR DISKUS) 250-50 MCG/DOSE AEPB Inhale 2 puffs into the lungs daily.        . furosemide (LASIX) 20 MG tablet Take 20 mg by mouth daily as needed. For edema      . Multiple Vitamin (MULTIVITAMIN) tablet Take 1 tablet by mouth daily.        Marland Kitchen oxyCODONE-acetaminophen (PERCOCET) 5-325 MG per tablet Take 1 tablet by mouth every 4 (four) hours as needed. For pain      . polyethylene glycol powder (MIRALAX) powder Take 17 g by mouth daily as needed. For constipation      . potassium chloride SA (K-DUR,KLOR-CON) 20 MEQ tablet Take 20 mEq by mouth daily as needed. Takes with lasix for edema       Scheduled:   . sodium chloride   Intravenous Once  . sodium chloride   Intravenous STAT  . aspirin EC  81 mg Oral Daily  . atorvastatin  40 mg Oral Daily  . clopidogrel  75 mg Oral Q breakfast  . docusate sodium  100 mg Oral Daily  . enoxaparin  40 mg Subcutaneous Daily  . Fluticasone-Salmeterol  1 puff Inhalation BID  . sodium chloride  3 mL Intravenous Q12H   Continuous:   Assessment/Plan: Active Problems:  HYPERLIPIDEMIA  HYPERTENSION, BENIGN SYSTEMIC  CORONARY, ARTERIOSCLEROSIS  CHF, EJECTION FRACTION > OR = 50%  CEREBROVAS DIS, LATE EFFECTS (S/P CVA)  AORTIC VALVE REPLACEMENT, HX OF  Chest pain   As discussed above, follow serial cardiac enzymes.  LOS: 0 days   Lasheika Ortloff D 06/23/2011, 8:34 AM

## 2011-06-23 NOTE — ED Provider Notes (Signed)
History     CSN: [id number]  Arrival date & time 06/23/11  0009   First MD Initiated Contact with Patient 06/23/11 0017      Chief Complaint  Patient presents with  . Chest Pain    substernal chest pain - nonrad - onset approx 30 min PTA - interm - resolved upon arrival of EMS; no associated symptoms      (Consider location/radiation/quality/duration/timing/severity/associated sxs/prior treatment) HPI Comments: Ill with a history of stroke, aortic valve replacement with a porcine valve, cardiac catheterization in 1999 where he was noted to only have mild coronary disease and an ejection fraction of approximately 40%. He is currently followed by the cardiologist Dr. Jens Som and his family Dr. Dr. Nehemiah Settle.  He presents with a complaint of chest pain which he states was left-sided, squeezing, acute in onset, persistent but gradually improved until it completely resolved by the time the paramedics arrived. It lasted approximately 20 minutes and was not associated with shortness of breath nausea, vomiting, diaphoresis or radiation. He does admit to having some mild swelling of his bilateral lower extremities. He denies any history of heart attacks or significant blockages. He does take aspirin and according to the medical record he also takes Plavix which seems to be a recent change in his medications since the beginning of the year. At this time the patient was chest pain-free.  Patient is a 78 y.o. male presenting with chest pain. The history is provided by the patient, a relative, the EMS personnel and medical records.  Chest Pain     Past Medical History  Diagnosis Date  . Acquired musculoskeletal deformity of unspecified site   . Chronic diastolic heart failure     EF >= 50%  . Aortic valve disorders     insufficiency  . Heart valve replaced by other means     aortic  . HTN (hypertension), benign     systematic  . Pure hypercholesterolemia   . COPD (chronic obstructive pulmonary  disease)   . Benign neoplasm of colon   . 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 Iron deficiency anemia, unspecified   . LFTs abnormal 3/06    LFTs wnl, Cr - 1.3 stable. 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 colonoscopy 3/05- adenomatous polyps, mild gastritis-diffuse interosseous skeletal hypertosis (DISH), h/o H. pylori s/p tx.    Past Surgical History  Procedure Date  . Aortic valve replacment     with pericardial tissue valve  . Cardiac catheterization 1/99    mild CAD; EF 35-40%    Family History  Problem Relation Age of Onset  . Colon cancer Neg Hx   . Heart disease Father     History  Substance Use Topics  . Smoking status: Former Games developer  . Smokeless tobacco: Not on file   Comment: remote h/ tobacco abuse   . Alcohol Use: Not on file      Review of Systems  Cardiovascular: Positive for chest pain.  All other systems reviewed and are negative.    Allergies  Review of patient's allergies indicates no known allergies.  Home Medications   Current Outpatient Rx  Name Route Sig Dispense Refill  . ASPIRIN 81 MG PO TABS Oral Take 81 mg by mouth daily.    . ATORVASTATIN CALCIUM 40 MG PO TABS Oral Take 40 mg by mouth daily.    Marland Kitchen CLOPIDOGREL  BISULFATE 75 MG PO TABS Oral Take 75 mg by mouth daily.    Marland Kitchen DOCUSATE SODIUM 100 MG PO CAPS Oral Take 100 mg by mouth daily.    Marland Kitchen FLUTICASONE-SALMETEROL 250-50 MCG/DOSE IN AEPB Inhalation Inhale 2 puffs into the lungs daily.      . FUROSEMIDE 20 MG PO TABS Oral Take 20 mg by mouth daily as needed. For edema    . ONE-DAILY MULTI VITAMINS PO TABS Oral Take 1 tablet by mouth daily.      . OXYCODONE-ACETAMINOPHEN 5-325 MG PO TABS Oral Take 1 tablet by mouth every 4 (four) hours as needed. For pain    . POLYETHYLENE GLYCOL 3350 PO POWD Oral Take 17 g by mouth daily as needed. For constipation    . POTASSIUM  CHLORIDE CRYS ER 20 MEQ PO TBCR Oral Take 20 mEq by mouth daily as needed. Takes with lasix for edema      BP 148/44  Pulse 67  Temp 98.2 F (36.8 C) (Oral)  Resp 19  SpO2 99%  Physical Exam  Nursing note and vitals reviewed. Constitutional: He appears well-developed and well-nourished. No distress.  HENT:  Head: Normocephalic and atraumatic.  Mouth/Throat: Oropharynx is clear and moist. No oropharyngeal exudate.  Eyes: Conjunctivae and EOM are normal. Pupils are equal, round, and reactive to light. Right eye exhibits no discharge. Left eye exhibits no discharge. No scleral icterus.  Neck: Normal range of motion. Neck supple. No JVD present. No thyromegaly present.  Cardiovascular: Normal rate, regular rhythm and intact distal pulses.  Exam reveals no gallop and no friction rub.   Murmur ( Soft systolic murmur, strong radial artery pulses, brisk capillary refill) heard. Pulmonary/Chest: Effort normal and breath sounds normal. No respiratory distress. He has no wheezes. He has no rales.  Abdominal: Soft. Bowel sounds are normal. He exhibits no distension and no mass. There is no tenderness.  Musculoskeletal: Normal range of motion. He exhibits edema ( Mild bilateral lower extremity edema, no asymmetry). He exhibits no tenderness.  Lymphadenopathy:    He has no cervical adenopathy.  Neurological: He is alert. Coordination normal.  Skin: Skin is warm and dry. No rash noted. No erythema.  Psychiatric: He has a normal mood and affect. His behavior is normal.    ED Course  Procedures (including critical care time)  Labs Reviewed  CBC - Abnormal; Notable for the following:    RBC 3.90 (*)     Hemoglobin 11.1 (*)     HCT 34.1 (*)     Platelets 109 (*)  PLATELET COUNT CONFIRMED BY SMEAR   All other components within normal limits  DIFFERENTIAL - Abnormal; Notable for the following:    Monocytes Relative 17 (*)     Monocytes Absolute 1.2 (*)     All other components within normal  limits  PROTIME-INR - Abnormal; Notable for the following:    Prothrombin Time 15.5 (*)     All other components within normal limits  POCT I-STAT, CHEM 8 - Abnormal; Notable for the following:    Glucose, Bld 117 (*)     Hemoglobin 12.2 (*)     HCT 36.0 (*)     All other components within normal limits  APTT  POCT I-STAT TROPONIN I   Dg Chest Port 1 View  06/23/2011  *RADIOLOGY REPORT*  Clinical Data: New onset of chest pain.  PORTABLE CHEST - 1 VIEW  Comparison: Chest radiograph performed 05/02/2010  Findings: The lungs are mildly hypoexpanded; mild bibasilar  opacities likely reflect atelectasis.  No pleural effusion or pneumothorax is seen.  The cardiomediastinal silhouette is mildly enlarged; the patient is status post median sternotomy.  No acute osseous abnormalities are seen.  Mild degenerative change is noted at the right glenohumeral joint, and involving the right humeral head.  IMPRESSION: Lungs mildly hypoexpanded; mild bibasilar airspace opacities likely reflect atelectasis.  Mild cardiomegaly noted.  Original Report Authenticated By: Tonia Ghent, M.D.     1. Chest pain       MDM  Overall the patient is well appearing at this time, his EKG shows nonspecific T wave flattening but no other significant findings and according to his old EKG from April there is no significant changes since that time. Chest pain suspect and will require cardiac markers, chest x-ray, anticipate consultation observational admission for this elderly man with risk factors for heart disease. 4 baby aspirin given prior to arrival by family member  ED ECG REPORT   Date: 06/23/2011 I personally interpreted the EKG  Rate: 70  Rhythm: normal sinus rhythm  QRS Axis: normal  Intervals: PR prolonged  ST/T Wave abnormalities: nonspecific T wave changes  Conduction Disutrbances:first-degree A-V block   Narrative Interpretation:   Old EKG Reviewed: Since 05/03/2010, no significant changes, first degree A-V  block persists    Discussed with cardiology as well as with the [facility] hospitalist Dr. Orvan Falconer who will admit the patient under observation for chest pain rule out. Laboratory data does not suggest that this is an acute coronary syndrome, EKG is nonischemic but nonspecific T wave changes are there, the chest x-ray does not notice any very specific changes in the laboratory data is overall normal.  Vida Roller, MD 06/23/11 [phone]

## 2011-08-12 ENCOUNTER — Other Ambulatory Visit (HOSPITAL_COMMUNITY): Payer: PRIVATE HEALTH INSURANCE

## 2011-09-15 ENCOUNTER — Other Ambulatory Visit: Payer: Self-pay | Admitting: Cardiology

## 2012-03-02 ENCOUNTER — Other Ambulatory Visit (HOSPITAL_COMMUNITY): Payer: Self-pay | Admitting: Cardiology

## 2012-03-02 DIAGNOSIS — I359 Nonrheumatic aortic valve disorder, unspecified: Secondary | ICD-10-CM

## 2012-03-03 ENCOUNTER — Ambulatory Visit (HOSPITAL_COMMUNITY): Payer: PRIVATE HEALTH INSURANCE | Attending: Internal Medicine | Admitting: Radiology

## 2012-03-03 DIAGNOSIS — I359 Nonrheumatic aortic valve disorder, unspecified: Secondary | ICD-10-CM

## 2012-03-03 NOTE — Progress Notes (Signed)
Echocardiogram performed.  

## 2012-10-13 ENCOUNTER — Encounter (HOSPITAL_COMMUNITY): Payer: Self-pay | Admitting: Emergency Medicine

## 2012-10-13 ENCOUNTER — Inpatient Hospital Stay (HOSPITAL_COMMUNITY)
Admission: EM | Admit: 2012-10-13 | Discharge: 2012-10-16 | DRG: 065 | Disposition: A | Discharge status: Home Health | Payer: PRIVATE HEALTH INSURANCE | Attending: Internal Medicine | Admitting: Internal Medicine

## 2012-10-13 ENCOUNTER — Inpatient Hospital Stay (HOSPITAL_COMMUNITY): Payer: PRIVATE HEALTH INSURANCE

## 2012-10-13 ENCOUNTER — Emergency Department (HOSPITAL_COMMUNITY): Payer: PRIVATE HEALTH INSURANCE

## 2012-10-13 DIAGNOSIS — Z87891 Personal history of nicotine dependence: Secondary | ICD-10-CM

## 2012-10-13 DIAGNOSIS — I5032 Chronic diastolic (congestive) heart failure: Secondary | ICD-10-CM | POA: Diagnosis present

## 2012-10-13 DIAGNOSIS — G8190 Hemiplegia, unspecified affecting unspecified side: Secondary | ICD-10-CM

## 2012-10-13 DIAGNOSIS — G8194 Hemiplegia, unspecified affecting left nondominant side: Secondary | ICD-10-CM | POA: Diagnosis present

## 2012-10-13 DIAGNOSIS — J4489 Other specified chronic obstructive pulmonary disease: Secondary | ICD-10-CM | POA: Diagnosis present

## 2012-10-13 DIAGNOSIS — I509 Heart failure, unspecified: Secondary | ICD-10-CM | POA: Diagnosis present

## 2012-10-13 DIAGNOSIS — J449 Chronic obstructive pulmonary disease, unspecified: Secondary | ICD-10-CM | POA: Diagnosis present

## 2012-10-13 DIAGNOSIS — H5440 Blindness, one eye, unspecified eye: Secondary | ICD-10-CM | POA: Diagnosis present

## 2012-10-13 DIAGNOSIS — I6789 Other cerebrovascular disease: Secondary | ICD-10-CM | POA: Diagnosis present

## 2012-10-13 DIAGNOSIS — I6350 Cerebral infarction due to unspecified occlusion or stenosis of unspecified cerebral artery: Principal | ICD-10-CM | POA: Diagnosis present

## 2012-10-13 DIAGNOSIS — I69922 Dysarthria following unspecified cerebrovascular disease: Secondary | ICD-10-CM

## 2012-10-13 DIAGNOSIS — Z952 Presence of prosthetic heart valve: Secondary | ICD-10-CM

## 2012-10-13 DIAGNOSIS — I63212 Cerebral infarction due to unspecified occlusion or stenosis of left vertebral arteries: Secondary | ICD-10-CM

## 2012-10-13 DIAGNOSIS — E785 Hyperlipidemia, unspecified: Secondary | ICD-10-CM | POA: Diagnosis present

## 2012-10-13 DIAGNOSIS — M959 Acquired deformity of musculoskeletal system, unspecified: Secondary | ICD-10-CM | POA: Diagnosis present

## 2012-10-13 DIAGNOSIS — Z7902 Long term (current) use of antithrombotics/antiplatelets: Secondary | ICD-10-CM

## 2012-10-13 DIAGNOSIS — I10 Essential (primary) hypertension: Secondary | ICD-10-CM | POA: Diagnosis present

## 2012-10-13 DIAGNOSIS — Z79899 Other long term (current) drug therapy: Secondary | ICD-10-CM

## 2012-10-13 DIAGNOSIS — I69992 Facial weakness following unspecified cerebrovascular disease: Secondary | ICD-10-CM

## 2012-10-13 DIAGNOSIS — Z23 Encounter for immunization: Secondary | ICD-10-CM

## 2012-10-13 DIAGNOSIS — Z7982 Long term (current) use of aspirin: Secondary | ICD-10-CM

## 2012-10-13 DIAGNOSIS — R64 Cachexia: Secondary | ICD-10-CM | POA: Diagnosis present

## 2012-10-13 DIAGNOSIS — R1310 Dysphagia, unspecified: Secondary | ICD-10-CM | POA: Diagnosis present

## 2012-10-13 DIAGNOSIS — I359 Nonrheumatic aortic valve disorder, unspecified: Secondary | ICD-10-CM

## 2012-10-13 DIAGNOSIS — D509 Iron deficiency anemia, unspecified: Secondary | ICD-10-CM | POA: Diagnosis present

## 2012-10-13 DIAGNOSIS — I69959 Hemiplegia and hemiparesis following unspecified cerebrovascular disease affecting unspecified side: Secondary | ICD-10-CM

## 2012-10-13 DIAGNOSIS — R4789 Other speech disturbances: Secondary | ICD-10-CM | POA: Diagnosis present

## 2012-10-13 DIAGNOSIS — I2510 Atherosclerotic heart disease of native coronary artery without angina pectoris: Secondary | ICD-10-CM | POA: Diagnosis present

## 2012-10-13 HISTORY — DX: Nonrheumatic aortic (valve) insufficiency: I35.1

## 2012-10-13 HISTORY — DX: Hemiplegia, unspecified affecting left nondominant side: G81.94

## 2012-10-13 LAB — COMPREHENSIVE METABOLIC PANEL
ALT: 23 U/L (ref 0–53)
AST: 39 U/L — ABNORMAL HIGH (ref 0–37)
Alkaline Phosphatase: 80 U/L (ref 39–117)
BUN: 20 mg/dL (ref 6–23)
CO2: 22 mEq/L (ref 19–32)
Calcium: 9.3 mg/dL (ref 8.4–10.5)
Chloride: 106 mEq/L (ref 96–112)
GFR calc Af Amer: >90 mL/min (ref >90)
GFR calc non Af Amer: 80 mL/min — ABNORMAL LOW (ref >90)
Glucose, Bld: 81 mg/dL (ref 70–99)
Sodium: 140 mEq/L (ref 135–145)
Total Bilirubin: 0.8 mg/dL (ref 0.3–1.2)

## 2012-10-13 LAB — DIFFERENTIAL
Basophils Relative: 1 % (ref 0–1)
Eosinophils Absolute: 0.3 10*3/uL (ref 0.0–0.7)
Eosinophils Relative: 4 % (ref 0–5)
Lymphocytes Relative: 33 % (ref 12–46)
Lymphs Abs: 2.3 10*3/uL (ref 0.7–4.0)
Neutro Abs: 3.8 10*3/uL (ref 1.7–7.7)
Neutrophils Relative %: 54 % (ref 43–77)

## 2012-10-13 LAB — CBC
HCT: 38 % — ABNORMAL LOW (ref 39.0–52.0)
Hemoglobin: 12.5 g/dL — ABNORMAL LOW (ref 13.0–17.0)
MCH: 28.6 pg (ref 26.0–34.0)
MCH: 28.3 pg (ref 26.0–34.0)
MCHC: 32.9 g/dL (ref 30.0–36.0)
MCHC: 33.3 g/dL (ref 30.0–36.0)
MCV: 84.8 fL (ref 78.0–100.0)
Platelets: 89 10*3/uL — ABNORMAL LOW (ref 150–400)
RBC: 4.37 MIL/uL (ref 4.22–5.81)
RBC: 3.96 MIL/uL — ABNORMAL LOW (ref 4.22–5.81)
RDW: 13.1 % (ref 11.5–15.5)

## 2012-10-13 LAB — POCT I-STAT TROPONIN I: Troponin i, poc: 0 ng/mL (ref 0.00–0.08)

## 2012-10-13 LAB — URINALYSIS, ROUTINE W REFLEX MICROSCOPIC
Bilirubin Urine: NEGATIVE
Glucose, UA: NEGATIVE mg/dL
Hgb urine dipstick: NEGATIVE
Ketones, ur: NEGATIVE mg/dL
Protein, ur: NEGATIVE mg/dL
Urobilinogen, UA: 0.2 mg/dL (ref 0.0–1.0)

## 2012-10-13 LAB — CREATININE, SERUM
Creatinine, Ser: 0.8 mg/dL (ref 0.50–1.35)
GFR calc Af Amer: >90 mL/min (ref >90)
GFR calc non Af Amer: 80 mL/min — ABNORMAL LOW (ref >90)

## 2012-10-13 LAB — GLUCOSE, CAPILLARY: Glucose-Capillary: 85 mg/dL (ref 70–99)

## 2012-10-13 LAB — APTT: aPTT: 31 seconds (ref 24–37)

## 2012-10-13 LAB — TROPONIN I: Troponin I: <0.3 ng/mL (ref <0.30)

## 2012-10-13 MED ORDER — HEPARIN SODIUM (PORCINE) 5000 UNIT/ML IJ SOLN
5000.0000 [IU] | Freq: Three times a day (TID) | INTRAMUSCULAR | Status: DC
  Administered 2012-10-13 – 2012-10-16 (×7): 5000 [IU] via SUBCUTANEOUS
  Filled 2012-10-13 (×11): qty 1

## 2012-10-13 MED ORDER — MORPHINE SULFATE 2 MG/ML IJ SOLN
1.0000 mg | INTRAMUSCULAR | Status: DC | PRN
  Administered 2012-10-13: 1 mg via INTRAVENOUS
  Filled 2012-10-13: qty 1

## 2012-10-13 MED ORDER — MOMETASONE FURO-FORMOTEROL FUM 100-5 MCG/ACT IN AERO
2.0000 | INHALATION_SPRAY | Freq: Two times a day (BID) | RESPIRATORY_TRACT | Status: DC
  Administered 2012-10-13 – 2012-10-15 (×4): 2 via RESPIRATORY_TRACT
  Filled 2012-10-13: qty 8.8

## 2012-10-13 MED ORDER — INFLUENZA VAC SPLIT QUAD 0.5 ML IM SUSP
0.5000 mL | INTRAMUSCULAR | Status: AC
  Administered 2012-10-14: 0.5 mL via INTRAMUSCULAR
  Filled 2012-10-13: qty 0.5

## 2012-10-13 MED ORDER — ONDANSETRON HCL 4 MG/2ML IJ SOLN: 4.0000 mg | Freq: Four times a day (QID) | INTRAMUSCULAR | Status: DC | PRN

## 2012-10-13 MED ORDER — SODIUM CHLORIDE 0.9 % IV SOLN
INTRAVENOUS | Status: AC
  Administered 2012-10-13: 23:00:00 via INTRAVENOUS

## 2012-10-13 MED ORDER — ONDANSETRON HCL 4 MG PO TABS: 4.0000 mg | ORAL_TABLET | Freq: Four times a day (QID) | ORAL | Status: DC | PRN

## 2012-10-13 MED ORDER — CLOPIDOGREL BISULFATE 75 MG PO TABS
75.0000 mg | ORAL_TABLET | Freq: Every day | ORAL | Status: DC
  Administered 2012-10-15 – 2012-10-16 (×2): 75 mg via ORAL
  Filled 2012-10-13 (×2): qty 1

## 2012-10-13 MED ORDER — BRIMONIDINE TARTRATE 0.2 % OP SOLN
1.0000 [drp] | Freq: Three times a day (TID) | OPHTHALMIC | Status: DC
  Administered 2012-10-13 – 2012-10-16 (×8): 1 [drp] via OPHTHALMIC
  Filled 2012-10-13: qty 5

## 2012-10-13 MED ORDER — ATORVASTATIN CALCIUM 40 MG PO TABS
40.0000 mg | ORAL_TABLET | Freq: Every day | ORAL | Status: DC
  Administered 2012-10-14 – 2012-10-16 (×2): 40 mg via ORAL
  Filled 2012-10-13 (×4): qty 1

## 2012-10-13 MED ORDER — ACETAMINOPHEN 650 MG RE SUPP: 650.0000 mg | RECTAL | Status: DC | PRN

## 2012-10-13 MED ORDER — ALBUTEROL SULFATE (5 MG/ML) 0.5% IN NEBU: 2.5000 mg | INHALATION_SOLUTION | RESPIRATORY_TRACT | Status: DC | PRN

## 2012-10-13 MED ORDER — GUAIFENESIN-DM 100-10 MG/5ML PO SYRP: 5.0000 mL | ORAL_SOLUTION | ORAL | Status: DC | PRN

## 2012-10-13 MED ORDER — ACETAMINOPHEN 325 MG PO TABS: 650.0000 mg | ORAL_TABLET | ORAL | Status: DC | PRN

## 2012-10-13 MED ORDER — SODIUM CHLORIDE 0.9 % IJ SOLN
3.0000 mL | Freq: Two times a day (BID) | INTRAMUSCULAR | Status: DC
  Administered 2012-10-13 – 2012-10-16 (×4): 3 mL via INTRAVENOUS

## 2012-10-13 MED ORDER — ASPIRIN 81 MG PO CHEW
81.0000 mg | CHEWABLE_TABLET | Freq: Every day | ORAL | Status: DC
  Administered 2012-10-15 – 2012-10-16 (×2): 81 mg via ORAL
  Filled 2012-10-13 (×3): qty 1

## 2012-10-13 MED ORDER — DORZOLAMIDE HCL-TIMOLOL MAL 2-0.5 % OP SOLN
1.0000 [drp] | Freq: Two times a day (BID) | OPHTHALMIC | Status: DC
  Administered 2012-10-13 – 2012-10-16 (×6): 1 [drp] via OPHTHALMIC
  Filled 2012-10-13: qty 10

## 2012-10-13 MED ORDER — POLYETHYLENE GLYCOL 3350 17 G PO PACK
17.0000 g | PACK | Freq: Every day | ORAL | Status: DC | PRN
  Filled 2012-10-13: qty 1

## 2012-10-13 MED ORDER — STROKE: EARLY STAGES OF RECOVERY BOOK
Freq: Once | Status: AC
  Administered 2012-10-14
  Filled 2012-10-13: qty 1

## 2012-10-13 MED ORDER — BRIMONIDINE TARTRATE 0.15 % OP SOLN
1.0000 [drp] | Freq: Three times a day (TID) | OPHTHALMIC | Status: DC
  Filled 2012-10-13: qty 5

## 2012-10-13 MED ORDER — LATANOPROST 0.005 % OP SOLN
1.0000 [drp] | Freq: Every day | OPHTHALMIC | Status: DC
  Administered 2012-10-13 – 2012-10-15 (×3): 1 [drp] via OPHTHALMIC
  Filled 2012-10-13: qty 2.5

## 2012-10-13 NOTE — ED Provider Notes (Signed)
CSN: [id number]     Arrival date & time 10/13/12  1326 History   First MD Initiated Contact with Patient 10/13/12 1333     Chief Complaint  Patient presents with  . Stroke Symptoms   (Consider location/radiation/quality/duration/timing/severity/associated sxs/prior Treatment) HPI Comments: 79 yo aa male presents to ER with cc of slurred speech and drooling and difficulty holding cup in right hand.  Onset of symptoms at 2000 last PM.  PMH c/w stroke, Diastolic HF, Aortic valve replacement, HTN, COPD.    No falls, no head trauma, no cp, no sob, no n/v/d, no abd pain. Pt took ASA and plavix today.    1969 - Stroke; pt with resultant L sided paralysis, and mild facial droop, and slurred speech, L eye blindness  PCM: Dr. Nehemiah Settle  Cardiology: Dr. Jens Som  Lives with his daughter Marylene Land - [phone]    Patient is a 79 y.o. male presenting with Acute Neurological Problem.  Cerebrovascular Accident This is a new problem. The current episode started 12 to 24 hours ago. The problem occurs constantly. The problem has been gradually improving. Pertinent negatives include no chest pain, no abdominal pain, no headaches and no shortness of breath. Nothing aggravates the symptoms. Nothing relieves the symptoms. He has tried nothing for the symptoms.    Past Medical History  Diagnosis Date  . Acquired musculoskeletal deformity of unspecified site   . Chronic diastolic heart failure     EF >= 50%  . Aortic valve disorders     insufficiency  . Heart valve replaced by other means     aortic  . HTN (hypertension), benign     systematic  . Pure hypercholesterolemia   . COPD (chronic obstructive pulmonary disease)   . Benign neoplasm of colon   . 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 Iron deficiency anemia, unspecified   . LFTs abnormal 3/06    LFTs wnl, Cr - 1.3 stable. 4/06 likely TIA; pt may also have DISH syndrome ( thick c vertebrae), ataxic gait 2/2 to L hemiparesis, bx of  scalp lesions=blue nevus (benign), 6/04. colonoscopy 3/05- adenomatous polyps, mild gastritis-diffuse interosseous skeletal hypertosis (DISH), h/o H. pylori s/p tx.  . Stroke   . Hemiparesis, left    Past Surgical History  Procedure Laterality Date  . Aortic valve replacment      with pericardial tissue valve  . Cardiac catheterization  1/99    mild CAD; EF 35-40%   Family History  Problem Relation Age of Onset  . Colon cancer Neg Hx   . Heart disease Father    History  Substance Use Topics  . Smoking status: Former Games developer  . Smokeless tobacco: Not on file     Comment: remote h/ tobacco abuse   . Alcohol Use: No    Review of Systems  Constitutional: Positive for activity change. Negative for fever, chills, diaphoresis, appetite change, fatigue and unexpected weight change.       Pt uses a hemiwalker  HENT:       Difficulty with swallowing per daughter. Drooling per daughter.  Eyes: Negative.   Respiratory: Negative for shortness of breath.   Cardiovascular: Negative for chest pain.       H/o valve replacement Aorta  Gastrointestinal: Negative for abdominal pain.  Endocrine: Negative.   Genitourinary: Negative.  Negative for dysuria, hematuria, discharge and difficulty urinating.  Allergic/Immunologic: Negative.   Neurological: Positive for facial asymmetry. Negative for dizziness and headaches.       Chronic facial  droop    Allergies  Review of patient's allergies indicates no known allergies.  Home Medications   Current Outpatient Rx  Name  Route  Sig  Dispense  Refill  . aspirin 81 MG tablet   Oral   Take 81 mg by mouth daily.         Marland Kitchen atorvastatin (LIPITOR) 40 MG tablet   Oral   Take 40 mg by mouth at bedtime.          . brimonidine (ALPHAGAN P) 0.1 % SOLN   Both Eyes   Place 1 drop into both eyes 2 (two) times daily.         . clopidogrel (PLAVIX) 75 MG tablet   Oral   Take 75 mg by mouth daily.         Marland Kitchen docusate sodium (COLACE) 100 MG  capsule   Oral   Take 100 mg by mouth daily.         . dorzolamide-timolol (COSOPT) 22.3-6.8 MG/ML ophthalmic solution   Both Eyes   Place 1 drop into both eyes 2 (two) times daily.         . Fluticasone-Salmeterol (ADVAIR DISKUS) 250-50 MCG/DOSE AEPB   Inhalation   Inhale 2 puffs into the lungs daily.           . furosemide (LASIX) 20 MG tablet   Oral   Take 20 mg by mouth daily as needed. For edema         . Multiple Vitamin (MULTIVITAMIN) tablet   Oral   Take 1 tablet by mouth daily.           Marland Kitchen oxyCODONE-acetaminophen (PERCOCET) 5-325 MG per tablet   Oral   Take 0.5-1 tablets by mouth every 4 (four) hours as needed. For pain         . polyethylene glycol powder (MIRALAX) powder   Oral   Take 17 g by mouth daily as needed. For constipation         . potassium chloride SA (K-DUR,KLOR-CON) 20 MEQ tablet      Take 20 mEq by mouth daily as needed. Takes with lasix for edema   30 tablet   12   . Travoprost, BAK Free, (TRAVATAN) 0.004 % SOLN ophthalmic solution   Both Eyes   Place 1 drop into both eyes at bedtime.          BP 136/62  Temp(Src) 98.2 F (36.8 C) (Oral)  Resp 17  SpO2 98% Physical Exam  Constitutional: He is oriented to person, place, and time. He appears well-developed and well-nourished.  HENT:  Head: Normocephalic and atraumatic.  Eyes: Conjunctivae are normal. Pupils are equal, round, and reactive to light. Right eye exhibits no discharge. Left eye exhibits no discharge.  Neck: Normal range of motion. Neck supple.  Cardiovascular: Normal rate and regular rhythm.   Murmur heard. Pulmonary/Chest: Effort normal and breath sounds normal. No respiratory distress. He has no wheezes. He has no rales.  Abdominal: Soft. Bowel sounds are normal. He exhibits no distension. There is no tenderness. There is no rebound and no guarding.  Musculoskeletal: He exhibits no edema and no tenderness.  Neurological: He is alert and oriented to person,  place, and time.  RUE: neg pronator drift, FAROM against gravity RLE: neg drift, FAROM against gravity  Skin: Skin is warm and dry.    ED Course  Procedures (including critical care time) Labs Review Imaging Review Ct Head Wo Contrast  10/13/2012   CLINICAL  DATA:  Stroke symptoms.  EXAM: CT HEAD WITHOUT CONTRAST  TECHNIQUE: Contiguous axial images were obtained from the base of the skull through the vertex without intravenous contrast.  COMPARISON:  Multiple priors.  FINDINGS: No acute intracranial hemorrhage, mass lesion, brain edema or extra-axial fluid collection is identified. There is stable encephalomalacia in the right frontal lobe with Wallerian degeneration and ipsilateral ventricular dilatation. Cerebral atrophy and chronic small vessel ischemic change. There is no evidence of acute cortical infarct. The visualized paranasal sinuses are clear. The calvarium is intact.  IMPRESSION: No acute intracranial abnormality. Stable chronic right frontal infarct with associated encephalomalacia   Electronically Signed   By: Jerene Dilling M.D.   On: 10/13/2012 14:47     Date: 10/13/2012  Rate: 59  Rhythm: sinus bradycardia  QRS Axis: normal  Intervals: normal  ST/T Wave abnormalities: nonspecific ST changes  Conduction Disutrbances:first-degree A-V block   Narrative Interpretation: SB, LVH, noisy baseline, no STEMI  Old EKG Reviewed: none available  Results for orders placed during the hospital encounter of 10/13/12  URINALYSIS, ROUTINE W REFLEX MICROSCOPIC      Result Value Range   Color, Urine YELLOW  YELLOW   APPearance CLEAR  CLEAR   Specific Gravity, Urine 1.011  1.005 - 1.030   pH 6.0  5.0 - 8.0   Glucose, UA NEGATIVE  NEGATIVE mg/dL   Hgb urine dipstick NEGATIVE  NEGATIVE   Bilirubin Urine NEGATIVE  NEGATIVE   Ketones, ur NEGATIVE  NEGATIVE mg/dL   Protein, ur NEGATIVE  NEGATIVE mg/dL   Urobilinogen, UA 0.2  0.0 - 1.0 mg/dL   Nitrite NEGATIVE  NEGATIVE   Leukocytes,  UA NEGATIVE  NEGATIVE  GLUCOSE, CAPILLARY      Result Value Range   Glucose-Capillary 85  70 - 99 mg/dL   Comment 1 Documented in Chart     Comment 2 Notify RN       MDM  No diagnosis found. 79 year old African American male presents emergency department with chief complaint of worsening slurred speech, right-sided weakness, and drooling. Symptoms began at 2000 hours last night. History obtained from the patient and his daughter with whom he lives. Daughter states that the symptoms have improved since he has arrived to the emergency department. He has a known left-sided paralysis from a remote stroke in 1969. He is also blind in his left eye. Neurologic exam in the emergency department reveals no drift on the right upper extremity and right lower extremity. Additionally he has full active range of motion of the right upper extremity and right lower extremity.  CT of the head without acute infarct or bleed however patient does have a chronic right frontal infarct with encephalomalacia. Consult to neurology Dr. Leroy Kennedy at 4:07 PM.  He will evaluate the patient in the emergency department.  At 4:07 PM blood work is still pending. Neuro consult still pending. Patient turned over to Dr. Blinda Leatherwood pending blood work and neuro consult.  Pt is stable w/o distress.  VSS.  Pt and family updated.      Darlys Gales, MD 10/13/12 [phone]

## 2012-10-13 NOTE — ED Provider Notes (Signed)
Case discussed briefly with Doctor Leroy Kennedy after his consult was complete. He does recommend admission to medicine service for further workup. Patient will be admitted to hospitalist service.  Gilda Crease, MD 10/13/12 [id number]

## 2012-10-13 NOTE — H&P (Signed)
[facility]                                                                                    Patient Demographics  Aaron Johnson, is a 79 y.o. male  MRN: [id number]   DOB - Dec 07, 1926  Admit Date - 10/13/2012  Outpatient Primary MD for the patient is Katy Apo, MD   With History of -  Past Medical History  Diagnosis Date  . Acquired musculoskeletal deformity of unspecified site   . Chronic diastolic heart failure     EF >= 50%  . Aortic valve disorders     insufficiency  . Heart valve replaced by other means     aortic  . HTN (hypertension), benign     systematic  . Pure hypercholesterolemia   . COPD (chronic obstructive pulmonary disease)   . Benign neoplasm of colon   . 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 Iron deficiency anemia, unspecified   . LFTs abnormal 3/06    LFTs wnl, Cr - 1.3 stable. 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 colonoscopy 3/05- adenomatous polyps, mild gastritis-diffuse interosseous skeletal hypertosis (DISH), h/o H. pylori s/p tx.  . Stroke   . Hemiparesis, left       Past Surgical History  Procedure Laterality Date  . Aortic valve replacment      with pericardial tissue valve  . Cardiac catheterization  1/99    mild CAD; EF 35-40%    in for   Chief Complaint  Patient presents with  . Stroke Symptoms     HPI  Aaron Johnson  is a 79 y.o. male, With history of previous CVA causing dense left-sided hemiparesis and facial weakness, blind in left eye, chronic Diastolic heart failure with EF of  50%, CAD , aortic valve replacement With bioprosthetic valve,Hypertension, dyslipidemia COPD who lives with his family was brought in by family members after they've noticed that he was having more Droop in his face at his baseline and there was some drooling from the end of his mouth this started sometime last evening  about 24 hours ago. He was brought to the ER for possible stroke. In the ER head CT scan showed no acute changes, initial lab work and EKG was stable, patient was seen by neurology service and hospitalist was requested to admit the patient for stroke/TIA workup.    Review of Systems    In addition to the HPI above,   No Fever-chills, No Headache, No changes with Vision or hearing, No problems swallowing food or Liquids, No Chest pain, Cough or Shortness of Breath, No Abdominal pain, No Nausea or Vommitting, Bowel movements are regular, No Blood in stool or Urine, No dysuria, No new skin rashes or bruises, No new joints pains-aches,  No new weakness, tingling, numbness in any extremity,He admits to chronic left-sided Extremity weakness and left-sided facial weakness No recent weight gain or loss, No polyuria, polydypsia or polyphagia, No significant Mental Stressors.  A full 10 point Review of Systems was done, except as stated above, all  other Review of Systems were negative.   Social History History  Substance Use Topics  . Smoking status: Former Games developer  . Smokeless tobacco: Not on file     Comment: remote h/ tobacco abuse   . Alcohol Use: No      Family History Family History  Problem Relation Age of Onset  . Colon cancer Neg Hx   . Heart disease Father       Prior to Admission medications   Medication Sig Start Date End Date Taking? Authorizing Provider  aspirin 81 MG tablet Take 81 mg by mouth daily.   Yes Historical Provider, MD  atorvastatin (LIPITOR) 40 MG tablet Take 40 mg by mouth at bedtime.    Yes Historical Provider, MD  brimonidine (ALPHAGAN P) 0.1 % SOLN Place 1 drop into both eyes 2 (two) times daily.   Yes Historical Provider, MD  clopidogrel (PLAVIX) 75 MG tablet Take 75 mg by mouth daily.   Yes Historical Provider, MD  docusate sodium (COLACE) 100 MG capsule Take 100 mg by mouth daily.   Yes Historical Provider, MD  dorzolamide-timolol (COSOPT)  22.3-6.8 MG/ML ophthalmic solution Place 1 drop into both eyes 2 (two) times daily.   Yes Historical Provider, MD  Fluticasone-Salmeterol (ADVAIR DISKUS) 250-50 MCG/DOSE AEPB Inhale 2 puffs into the lungs daily.     Yes Historical Provider, MD  furosemide (LASIX) 20 MG tablet Take 20 mg by mouth daily as needed. For edema   Yes Historical Provider, MD  Multiple Vitamin (MULTIVITAMIN) tablet Take 1 tablet by mouth daily.     Yes Historical Provider, MD  oxyCODONE-acetaminophen (PERCOCET) 5-325 MG per tablet Take 0.5-1 tablets by mouth every 4 (four) hours as needed. For pain   Yes Historical Provider, MD  polyethylene glycol powder (MIRALAX) powder Take 17 g by mouth daily as needed. For constipation   Yes Historical Provider, MD  potassium chloride SA (K-DUR,KLOR-CON) 20 MEQ tablet Take 20 mEq by mouth daily as needed. Takes with lasix for edema 09/15/11  Yes Lewayne Bunting, MD  Travoprost, BAK Free, (TRAVATAN) 0.004 % SOLN ophthalmic solution Place 1 drop into both eyes at bedtime.   Yes Historical Provider, MD    No Known Allergies  Physical Exam  Vitals  Blood pressure 141/76, pulse 96, temperature 98.2 F (36.8 C), temperature source Oral, resp. rate 19, SpO2 97.00%.   1. General elderly African American male lying in bed in NAD,     2. Normal affect and insight, Not Suicidal or Homicidal, Awake Alert  3. No F.N deficits, ALL C.Nerves Intact, Chr L.sided hemiparesis and L Arm contracture, L facial weakness  4. Ears and Eyes appear Normal, Conjunctivae clear, PERRLA. Moist Oral Mucosa.  5. Supple Neck, No JVD, No cervical lymphadenopathy appriciated, No Carotid Bruits.  6. Symmetrical Chest wall movement, Good air movement bilaterally, CTAB.  7. RRR, No Gallops, Rubs or Murmurs, No Parasternal Heave.  8. Positive Bowel Sounds, Abdomen Soft, Non tender, No organomegaly appriciated,No rebound -guarding or rigidity.  9.  No Cyanosis, Normal Skin Turgor, No Skin Rash or  Bruise.  10. Good muscle tone,  joints appear normal , no effusions, Normal ROM.  11. No Palpable Lymph Nodes in Neck or Axillae    Data Review  CBC  Recent Labs Lab 10/13/12 1630  WBC 7.1  HGB 12.5*  HCT 38.0*  PLT 76*  MCV 87.0  MCH 28.6  MCHC 32.9  RDW 13.3  LYMPHSABS 2.3  MONOABS 0.6  EOSABS 0.3  BASOSABS 0.1   ------------------------------------------------------------------------------------------------------------------  Chemistries   Recent Labs Lab 10/13/12 1630  NA 140  K 4.9  CL 106  CO2 22  GLUCOSE 81  BUN 20  CREATININE 0.79  CALCIUM 9.3  AST 39*  ALT 23  ALKPHOS 80  BILITOT 0.8   ------------------------------------------------------------------------------------------------------------------ CrCl is unknown because both a height and weight (above a minimum accepted value) are required for this calculation. ------------------------------------------------------------------------------------------------------------------ No results found for this basename: TSH, T4TOTAL, FREET3, T3FREE, THYROIDAB,  in the last 72 hours   Coagulation profile  Recent Labs Lab 10/13/12 1630  INR 1.14   ------------------------------------------------------------------------------------------------------------------- No results found for this basename: DDIMER,  in the last 72 hours -------------------------------------------------------------------------------------------------------------------  Cardiac Enzymes  Recent Labs Lab 10/13/12 1630  TROPONINI <0.30   ------------------------------------------------------------------------------------------------------------------ No components found with this basename: POCBNP,    ---------------------------------------------------------------------------------------------------------------  Urinalysis    Component Value Date/Time   COLORURINE YELLOW 10/13/2012 1511   APPEARANCEUR CLEAR 10/13/2012  1511   LABSPEC 1.011 10/13/2012 1511   PHURINE 6.0 10/13/2012 1511   GLUCOSEU NEGATIVE 10/13/2012 1511   HGBUR NEGATIVE 10/13/2012 1511   BILIRUBINUR NEGATIVE 10/13/2012 1511   KETONESUR NEGATIVE 10/13/2012 1511   PROTEINUR NEGATIVE 10/13/2012 1511   UROBILINOGEN 0.2 10/13/2012 1511   NITRITE NEGATIVE 10/13/2012 1511   LEUKOCYTESUR NEGATIVE 10/13/2012 1511    ----------------------------------------------------------------------------------------------------------------  Imaging results:   Ct Head Wo Contrast  10/13/2012   CLINICAL DATA:  Stroke symptoms.  EXAM: CT HEAD WITHOUT CONTRAST  TECHNIQUE: Contiguous axial images were obtained from the base of the skull through the vertex without intravenous contrast.  COMPARISON:  Multiple priors.  FINDINGS: No acute intracranial hemorrhage, mass lesion, brain edema or extra-axial fluid collection is identified. There is stable encephalomalacia in the right frontal lobe with Wallerian degeneration and ipsilateral ventricular dilatation. Cerebral atrophy and chronic small vessel ischemic change. There is no evidence of acute cortical infarct. The visualized paranasal sinuses are clear. The calvarium is intact.  IMPRESSION: No acute intracranial abnormality. Stable chronic right frontal infarct with associated encephalomalacia   Electronically Signed   By: Jerene Dilling M.D.   On: 10/13/2012 14:47    My personal review of EKG: Rhythm NSR, LVH pattern strain , no Acute ST changes    Assessment & Plan   1.Worsening left-sided weakness along with facial droop and drooling acute on chronic.- And has previous large right MCA territory stroke, symptoms started about 24 hours ago, initial head CT is unremarkable. He is been seen by neuro. He'll be admitted to telemetry bed for ongoing TIA/CVA workup. We will check MRI MRA brain, echo gram, carotid duplex, lipid panel, A1c. He will be evaluated by PT-OT-speech therapy. For secondary prevention for  now his dual antiplatelet therapy of aspirin Plavix will be continued along with his home dose statin. Further recommendations based on his studies to come from neurology.     2. Chronic Diastolic heart failure EF of 50%. Currently appears compensated.     3.Dyslipidemia. Lipid panel will be checked, home to statin to be continued for now.     4.Hypertension stable for now he's not on any schedule and hypertensive medications, we will monitor and adjust medications as needed.     5. History of CAD/aortic valve replacement which is bioprosthetic. No acute issues, He is symptom free supportive care will be provided    6. COPD. Compensated not short of breath no wheezing. Nebulizer treatments and oxygen as needed     DVT Prophylaxis Heparin  AM Labs Ordered, also please review Full Orders  Family Communication: Admission, patients condition and plan of care including tests being ordered have been discussed with the patient who indicates understanding and agree with the plan and Code Status.  Code Status Full  Likely DC to  TBD  Condition GUARDED  Time spent in minutes : 35    Rishit Burkhalter K M.D on 10/13/2012 at 5:41 PM  Between 7am to 7pm - Pager - [phone]  After 7pm go to [email or web address] - password [email or web address]  And look for the night coverage person covering me after hours  [facility] Office  [phone]

## 2012-10-13 NOTE — ED Notes (Signed)
Pain med given as ordered while pt in MRI

## 2012-10-13 NOTE — ED Notes (Signed)
Patient transported to MRI 

## 2012-10-13 NOTE — ED Notes (Signed)
Family reports worsening of facial droop, slurred speech since 1800 yesterday. Speech presently incomprehensible. Pt had previous CVA with residual left side paralysis & slurred speech

## 2012-10-13 NOTE — Consult Note (Addendum)
NEURO HOSPITALIST CONSULT NOTE    Reason for Consult: worsening left hemiparesis and dysarthria.   HPI:                                                                                                                                          Aaron Johnson is an 79 y.o. 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 She said that he is able to ambulate with a hemi walker, but last night she noticed that he was not able to use his left side as he usually does and couldn't even drink form a cup with the left hand. She stated that today he has been completely unable to use the left side. In addition, she thinks that his speech is significantly worse and he was drooling a lot earlier today. Aaron Johnson denies HA, vertigo, confusion, or falls. No recent fever or infection. On aspirin and plavix daily.   Past Medical History  Diagnosis Date  . Acquired musculoskeletal deformity of unspecified site   . Chronic diastolic heart failure     EF >= 50%  . Aortic valve disorders     insufficiency  . Heart valve replaced by other means     aortic  . HTN (hypertension), benign     systematic  . Pure hypercholesterolemia   . COPD (chronic obstructive pulmonary disease)   . Benign neoplasm of colon   . 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 Iron deficiency anemia, unspecified   . LFTs abnormal 3/06    LFTs wnl, Cr - 1.3 stable. 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 colonoscopy 3/05- adenomatous polyps, mild gastritis-diffuse interosseous skeletal hypertosis (DISH), h/o H. pylori s/p tx.  . Stroke   .  Hemiparesis, left     Past Surgical History  Procedure Laterality Date  . Aortic valve replacment      with pericardial tissue valve  . Cardiac catheterization  1/99    mild CAD; EF 35-40%    Family History  Problem Relation Age of Onset  . Colon cancer Neg Hx   . Heart disease Father     Social History:  reports that he has quit smoking. He does not have any smokeless tobacco history on file. He reports that he does not drink alcohol or use illicit drugs.  No Known Allergies  MEDICATIONS:  I have reviewed the patient's current medications.   ROS:                                                                                                                                       History obtained from the patient, daughter, and chart review.  General ROS: negative for - chills, fatigue, fever, night sweats, weight gain or weight loss Psychological ROS: negative for - behavioral disorder, hallucinations, memory difficulties, mood swings or suicidal ideation Ophthalmic ROS: negative for -  eye pain. Legally blind right eye, completely blind left eye. ENT ROS: negative for - epistaxis, nasal discharge, oral lesions, sore throat, tinnitus or vertigo Allergy and Immunology ROS: negative for - hives or itchy/watery eyes Hematological and Lymphatic ROS: negative for - bleeding problems, bruising or swollen lymph nodes Endocrine ROS: negative for - galactorrhea, hair pattern changes, polydipsia/polyuria or temperature intolerance Respiratory ROS: negative for - cough, hemoptysis, shortness of breath or wheezing Cardiovascular ROS: negative for - chest pain, dyspnea on exertion, edema or irregular heartbeat Gastrointestinal ROS: negative for - abdominal pain, diarrhea, hematemesis, nausea/vomiting or stool incontinence Genito-Urinary ROS: negative for - dysuria, hematuria,  incontinence or urinary frequency/urgency Musculoskeletal ROS: negative for - joint swelling Neurological ROS: as noted in HPI Dermatological ROS: negative for rash and skin lesion changes   Physical exam: pleasant male in no apparent distress.Blood pressure 141/76, pulse 96, temperature 98.2 F (36.8 C), temperature source Oral, resp. rate 19, SpO2 97.00%. Head: normocephalic. Neck: supple, no bruits, no JVD. Cardiac: no murmurs. Lungs: clear. Abdomen: soft, no tender, no mass. Extremities: no edema. Neurologic Examination:                                                                                                      Mental Status: Alert, awake, oriented only to place and situation, thought content appropriate.  Dysarthric but no aphasia.  Able to follow 3 step commands without difficulty. Cranial Nerves: II: Discs flat bilaterally; Visual fields: blind left eye, legally blind right eye, pupils equal, round, no reactive to light  III,IV, VI: ptosis not present, extra-ocular motions intact bilaterally V: facial light touch sensation normal bilaterally VII: left face weakness VIII: hearing normal bilaterally IX,X: gag reflex present XI: bilateral shoulder shrug XII: midline tongue extension Motor: Dense left hemiparesis with increased tone Sensory: Pinprick and light touch intact throughout, bilaterally Deep Tendon Reflexes:  Overactive in the left.  Plantars: Right: downgoing   Left: upgoing Cerebellar: Unable to test Gait:  No tested. CV: pulses palpable throughout    Lab Results  Component Value Date/Time   CHOL  Value: 119        ATP III CLASSIFICATION:  <200     mg/dL   Desirable  161-096  mg/dL   Borderline High  >=045    mg/dL   High        04/12/8117  5:48 AM    Results for orders placed during the hospital encounter of 10/13/12 (from the past 48 hour(s))  GLUCOSE, CAPILLARY     Status: None   Collection Time    10/13/12  2:04 PM      Result Value Range    Glucose-Capillary 85  70 - 99 mg/dL   Comment 1 Documented in Chart     Comment 2 Notify RN    URINALYSIS, ROUTINE W REFLEX MICROSCOPIC     Status: None   Collection Time    10/13/12  3:11 PM      Result Value Range   Color, Urine YELLOW  YELLOW   APPearance CLEAR  CLEAR   Specific Gravity, Urine 1.011  1.005 - 1.030   pH 6.0  5.0 - 8.0   Glucose, UA NEGATIVE  NEGATIVE mg/dL   Hgb urine dipstick NEGATIVE  NEGATIVE   Bilirubin Urine NEGATIVE  NEGATIVE   Ketones, ur NEGATIVE  NEGATIVE mg/dL   Protein, ur NEGATIVE  NEGATIVE mg/dL   Urobilinogen, UA 0.2  0.0 - 1.0 mg/dL   Nitrite NEGATIVE  NEGATIVE   Leukocytes, UA NEGATIVE  NEGATIVE   Comment: MICROSCOPIC NOT DONE ON URINES WITH NEGATIVE PROTEIN, BLOOD, LEUKOCYTES, NITRITE, OR GLUCOSE <1000 mg/dL.    Ct Head Wo Contrast  10/13/2012   CLINICAL DATA:  Stroke symptoms.  EXAM: CT HEAD WITHOUT CONTRAST  TECHNIQUE: Contiguous axial images were obtained from the base of the skull through the vertex without intravenous contrast.  COMPARISON:  Multiple priors.  FINDINGS: No acute intracranial hemorrhage, mass lesion, brain edema or extra-axial fluid collection is identified. There is stable encephalomalacia in the right frontal lobe with Wallerian degeneration and ipsilateral ventricular dilatation. Cerebral atrophy and chronic small vessel ischemic change. There is no evidence of acute cortical infarct. The visualized paranasal sinuses are clear. The calvarium is intact.  IMPRESSION: No acute intracranial abnormality. Stable chronic right frontal infarct with associated encephalomalacia   Electronically Signed   By: Jerene Dilling M.D.   On: 10/13/2012 14:47     Assessment/Plan: 79 Y/O with multiple risk factors for stroke including prior stroke with residual left HP and dysarthria, comes in with worsening left sided weakness/dysarthria since last night. No recent fever or infection. U/A without infection. Metabolic panel pending. Need to  ruled out new stroke. Agree with admission to medicine.  Get MRI/MRA brain and decide whether or not full stroke work up warranted. Continue aspirin and plavix. Will follow up.  Wyatt Portela, MD [facility] [phone]  10/13/2012, 4:22 PM

## 2012-10-13 NOTE — Progress Notes (Signed)
Pt received morphine while in MRI for back pain; pt still refused to continue MRI all imaging obtained sent to radiologist ; MRA not done. From previous notes this was an issue during pt's last MRI

## 2012-10-13 NOTE — ED Notes (Signed)
Report given to [facility], Charity fundraiser. Pt presently in MRI.  MRI called unable to complete test for pt being restless & c/o back pain. ED MD aware, orders received.

## 2012-10-14 ENCOUNTER — Inpatient Hospital Stay (HOSPITAL_COMMUNITY): Payer: PRIVATE HEALTH INSURANCE

## 2012-10-14 DIAGNOSIS — I369 Nonrheumatic tricuspid valve disorder, unspecified: Secondary | ICD-10-CM

## 2012-10-14 DIAGNOSIS — I6350 Cerebral infarction due to unspecified occlusion or stenosis of unspecified cerebral artery: Secondary | ICD-10-CM

## 2012-10-14 LAB — BASIC METABOLIC PANEL
BUN: 16 mg/dL (ref 6–23)
Calcium: 8.9 mg/dL (ref 8.4–10.5)
Chloride: 106 mEq/L (ref 96–112)
Creatinine, Ser: 0.8 mg/dL (ref 0.50–1.35)
GFR calc Af Amer: >90 mL/min (ref >90)
Glucose, Bld: 87 mg/dL (ref 70–99)
Potassium: 3.4 mEq/L — ABNORMAL LOW (ref 3.5–5.1)

## 2012-10-14 LAB — CBC
HCT: 33.7 % — ABNORMAL LOW (ref 39.0–52.0)
Hemoglobin: 11.3 g/dL — ABNORMAL LOW (ref 13.0–17.0)
MCH: 28.3 pg (ref 26.0–34.0)
MCHC: 33.5 g/dL (ref 30.0–36.0)
RDW: 13 % (ref 11.5–15.5)

## 2012-10-14 LAB — HEMOGLOBIN A1C: Mean Plasma Glucose: 103 mg/dL (ref <117)

## 2012-10-14 LAB — TSH: TSH: 2.861 u[IU]/mL (ref 0.350–4.500)

## 2012-10-14 LAB — LIPID PANEL
Cholesterol: 140 mg/dL (ref 0–200)
HDL: 76 mg/dL (ref >39)
Total CHOL/HDL Ratio: 1.8 RATIO
Triglycerides: 43 mg/dL (ref <150)
VLDL: 9 mg/dL (ref 0–40)

## 2012-10-14 NOTE — Evaluation (Signed)
Clinical/Bedside Swallow Evaluation Patient Details  Name: Aaron Johnson MRN: [id number] Date of Birth: 05/06/1926  Today's Date: 10/14/2012 Time:  -     Past Medical History:  Past Medical History  Diagnosis Date  . Acquired musculoskeletal deformity of unspecified site   . Chronic diastolic heart failure     EF >= 50%  . HTN (hypertension), benign     systematic  . Pure hypercholesterolemia   . COPD (chronic obstructive pulmonary disease)   . Benign neoplasm of colon   . 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 Iron deficiency anemia, unspecified   . LFTs abnormal 3/06    LFTs wnl, Cr - 1.3 stable. 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 colonoscopy 3/05- adenomatous polyps, mild gastritis-diffuse interosseous skeletal hypertosis (DISH), h/o H. pylori s/p tx.  . Stroke   . Hemiparesis, left   . Aortic valve insufficiency    Past Surgical History:  Past Surgical History  Procedure Laterality Date  . Cardiac catheterization  1/99    mild CAD; EF 35-40%  . Aortic valve replacement      with pericardial tissue valve  . Cardiac valve replacement    . Multiple tooth extractions     HPI:  Aaron Johnson  is a 79 y.o. male, With history of previous CVA causing dense left-sided hemiparesis and facial weakness, blind in left eye, chronic Diastolic heart failure with EF of  50%, CAD , aortic valve replacement With bioprosthetic valve,Hypertension, dyslipidemia COPD who lives with his family was brought in by family members after they've noticed that he was having more Droop in his face at his baseline and there was some drooling from the end of his mouth this started sometime last evening about 24 hours ago. He was brought to the ER for possible stroke. In the ER head CT scan showed no acute changes, initial lab work and EKG was stable, patient was seen by neurology  service and hospitalist was requested to admit the patient for stroke/TIA workup.  BSE indicated per Stroke Protocol.    Assessment / Plan / Recommendation Clinical Impression  Moderate oral phase dysphagia with min to moderate pharyngeal phase dysphagia.   Anterior spillage on left with liquids and puree consistency.  Poor oral awareness with soft solids as no attempts made to masticate as patient swallowed bolus whole.  Pharyngeal phase characterized by suspected delay with reduced hyoid laryngeal elevation.  Delayed+s/s of penetration vs. Aspiration s/p swallow of puree followed by sips of thin liquid by cup.  Recommend continued NPO due to s/s present and new onset of weakness.   Proceed with objective evaluation of MBS to assess risk for aspiration and determine safest, PO diet    Aspiration Risk  Moderate    Diet Recommendation NPO   Medication Administration: Via alternative means    Other  Recommendations Recommended Consults: MBS Oral Care Recommendations: Oral care Q4 per protocol   Follow Up Recommendations  Inpatient Rehab    Frequency and Duration min 2x/week  2 weeks       SLP Swallow Goals     Swallow Study Prior Functional Status  Type of Home: House Available Help at Discharge: Family;Available PRN/intermittently    General Date of Onset: 10/13/12 HPI: Aaron Johnson  is a 79 y.o. male, With history of previous CVA causing dense left-sided hemiparesis and facial weakness, blind in left  eye, chronic Diastolic heart failure with EF of  50%, CAD , aortic valve replacement With bioprosthetic valve,Hypertension, dyslipidemia COPD who lives with his family was brought in by family members after they've noticed that he was having more Droop in his face at his baseline and there was some drooling from the end of his mouth this started sometime last evening about 24 hours ago. He was brought to the ER for possible stroke. In the ER head CT scan showed no acute changes, initial  lab work and EKG was stable, patient was seen by neurology service and hospitalist was requested to admit the patient for stroke/TIA workup. Type of Study: Bedside swallow evaluation Diet Prior to this Study: NPO Temperature Spikes Noted: No Respiratory Status: Room air History of Recent Intubation: No Behavior/Cognition: Alert;Cooperative;Pleasant mood Oral Cavity - Dentition: Edentulous Self-Feeding Abilities: Needs assist;Able to feed self;Needs set up Patient Positioning: Upright in chair Baseline Vocal Quality: Clear Volitional Cough: Strong Volitional Swallow: Able to elicit    Oral/Motor/Sensory Function Overall Oral Motor/Sensory Function: Impaired at baseline Labial ROM: Reduced right Labial Symmetry: Abnormal symmetry right Labial Strength: Reduced Labial Sensation: Reduced Lingual ROM: Reduced right Lingual Symmetry: Abnormal symmetry right Lingual Strength: Reduced Lingual Sensation: Reduced Facial ROM: Reduced right Facial Symmetry: Right droop Facial Strength: Reduced Facial Sensation: Reduced Velum: Within Functional Limits Mandible: Within Functional Limits   Ice Chips Ice chips: Impaired Pharyngeal Phase Impairments: Suspected delayed Swallow;Decreased hyoid-laryngeal movement   Thin Liquid Thin Liquid: Impaired Presentation: Cup;Spoon Pharyngeal  Phase Impairments: Suspected delayed Swallow;Decreased hyoid-laryngeal movement    Nectar Thick Nectar Thick Liquid: Impaired Oral phase functional implications: Left anterior spillage Pharyngeal Phase Impairments: Suspected delayed Swallow;Decreased hyoid-laryngeal movement   Honey Thick Honey Thick Liquid: Not tested   Puree Puree: Impaired Presentation: Self Fed;Spoon Pharyngeal Phase Impairments: Suspected delayed Swallow;Decreased hyoid-laryngeal movement   Solid   GO    Solid: Impaired Oral Phase Impairments: Reduced labial seal;Reduced lingual movement/coordination;Poor awareness of bolus Pharyngeal  Phase Impairments: Suspected delayed Swallow;Decreased hyoid-laryngeal movement;Cough - Delayed      Moreen Fowler MS, CCC-SLP [phone] [facility] 10/14/2012,12:09 PM

## 2012-10-14 NOTE — Progress Notes (Signed)
*  PRELIMINARY RESULTS* Vascular Ultrasound Carotid Duplex (Doppler) has been completed. Study was technically limited due to patient's inability to turn neck adequately. Findings suggest 1-39% internal carotid artery stenosis bilaterally. Unable to detect flow in the right external carotid artery distal to the origin, suggestive of possible occlusion versus technical limitations as specified above. Unable to visualize vertebral arteries due to technical limitations.  10/14/2012 4:38 PM Gertie Fey, RVT, RDCS, RDMS

## 2012-10-14 NOTE — Procedures (Addendum)
Objective Swallowing Evaluation: Modified Barium Swallowing Study  Patient Details  Name: Aaron Johnson MRN: [id number] Date of Birth: 05/06/1926  Today's Date: 10/14/2012 Time:  -     Past Medical History:  Past Medical History  Diagnosis Date  . Acquired musculoskeletal deformity of unspecified site   . Chronic diastolic heart failure     EF >= 50%  . HTN (hypertension), benign     systematic  . Pure hypercholesterolemia   . COPD (chronic obstructive pulmonary disease)   . Benign neoplasm of colon   . 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 Iron deficiency anemia, unspecified   . LFTs abnormal 3/06    LFTs wnl, Cr - 1.3 stable. 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 colonoscopy 3/05- adenomatous polyps, mild gastritis-diffuse interosseous skeletal hypertosis (DISH), h/o H. pylori s/p tx.  . Stroke   . Hemiparesis, left   . Aortic valve insufficiency    Past Surgical History:  Past Surgical History  Procedure Laterality Date  . Cardiac catheterization  1/99    mild CAD; EF 35-40%  . Aortic valve replacement      with pericardial tissue valve  . Cardiac valve replacement    . Multiple tooth extractions     HPI:  79 y/o male with PMH significant for HTN, Hypercholesterolemia, Chonic Diastolic CHF, s/p aortic valve replacement, stroke with residual left hemiparesis, blindness left eye brought to Ed by his daughter due to worsening left sided weakness and slurred speech.   MRI 10/10/Acute nonhemorrhagic left thalamic infarct.  MBS indicated following results of BSE to assess risk for aspiration.      Assessment / Plan / Recommendation Clinical Impression  Dysphagia Diagnosis: Moderate oral phase dysphagia;Mild pharyngeal phase dysphagia;Mild cervical esophageal phase dysphagia Moderate sensory motor oral phase dysphagia with mild sensory motor and  structural pharyngeal dysphagia. Patient with severe kyphosis with what appeared soft tissue impinging pharyngeal wall.  Appeared to be osteophyte at C-6.  Radiologist, Dr. Si Gaul present and confirmed no change with cervical spine in comparison to prior xrays.  One instance of silent penetration with puree consistency during the swallow from residuals in pyriforms.  Severe pooling in pyriforms before and after swallow with thin and puree consistency. Appeared to backflow to pyriforms with puree and thin liquids.   Brief esophageal screen indicates prominent CP segment with reduced cricopharygneal relaxation.  Whole barium tablet lodged in distal esophagus requiring multiple dry swallows followed by teaspoon of puree to complete transit.  Strategy of multiple swallows per bites and sips  most effective to reduce residuals in pyriforms.  Recommend to initiate dysphagia 2 (finely chopped) and thin liquids.  Recommend full supervision to provide cueing to patient to complete recommended swallow strategies.  ST to follow in Acute Care for diet tolerance. Repeat MBS not indicated at this time.     Treatment Recommendation       Diet Recommendation Dysphagia 2 (Fine chop);Thin liquid   Liquid Administration via: Cup;Straw Medication Administration: Whole meds with puree Supervision: Patient able to self feed;Staff to assist with self feeding;Full supervision/cueing for compensatory strategies Compensations: Slow rate;Small sips/bites;Multiple dry swallows after each bite/sip;Effortful swallow Postural Changes and/or Swallow Maneuvers: Seated upright 90 degrees;Upright 30-60 min after meal;Out of bed for meals    Other  Recommendations Oral Care Recommendations: Oral care Q4 per protocol   Follow Up Recommendations  Inpatient Rehab  Frequency and Duration min 2x/week  2 weeks       General Date of Onset: 10/13/12 HPI: 79 y/o male with PMH significant for HTN, Hypercholesterolemia, Chonic Diastolic  CHF, s/p aortic valve replacement, stroke with residual left hemiparesis, blindness left eye brought to Ed by his daughter due to worsening left sided weakness and slurred speech.   Type of Study: Modified Barium Swallowing Study Reason for Referral: Objectively evaluate swallowing function Diet Prior to this Study: NPO Temperature Spikes Noted: No Respiratory Status: Room air History of Recent Intubation: No Behavior/Cognition: Alert;Cooperative;Pleasant mood Oral Cavity - Dentition: Edentulous Oral Motor / Sensory Function: Impaired - see Bedside swallow eval Self-Feeding Abilities: Able to feed self;Needs assist Patient Positioning: Upright in chair Baseline Vocal Quality: Hoarse;Low vocal intensity Volitional Cough: Strong Volitional Swallow: Able to elicit Anatomy: Within functional limits Pharyngeal Secretions: Not observed secondary MBS    Reason for Referral Objectively evaluate swallowing function   Oral Phase Oral Preparation/Oral Phase Oral Phase: Impaired Oral - Thin Oral - Thin Teaspoon: Incomplete tongue to palate contact;Reduced posterior propulsion;Piecemeal swallowing;Delayed oral transit Oral - Thin Cup: Incomplete tongue to palate contact;Reduced posterior propulsion;Piecemeal swallowing;Delayed oral transit;Weak lingual manipulation Oral - Solids Oral - Puree: Incomplete tongue to palate contact;Piecemeal swallowing;Reduced posterior propulsion;Delayed oral transit;Lingual/palatal residue Oral - Mechanical Soft: Incomplete tongue to palate contact;Piecemeal swallowing;Lingual/palatal residue;Weak lingual manipulation;Reduced posterior propulsion;Delayed oral transit   Pharyngeal Phase Pharyngeal Phase Pharyngeal Phase: Impaired Pharyngeal - Thin Pharyngeal - Thin Teaspoon: Reduced pharyngeal peristalsis;Reduced anterior laryngeal mobility;Reduced laryngeal elevation;Reduced airway/laryngeal closure;Premature spillage to pyriform sinuses;Reduced tongue base  retraction;Pharyngeal residue - pyriform sinuses;Pharyngeal residue - cp segment Pharyngeal - Thin Cup: Reduced pharyngeal peristalsis;Reduced anterior laryngeal mobility;Reduced laryngeal elevation;Premature spillage to pyriform sinuses;Reduced airway/laryngeal closure;Pharyngeal residue - pyriform sinuses Pharyngeal - Thin Straw: Delayed swallow initiation;Premature spillage to pyriform sinuses;Reduced pharyngeal peristalsis;Reduced anterior laryngeal mobility;Reduced laryngeal elevation;Reduced airway/laryngeal closure;Pharyngeal residue - cp segment;Pharyngeal residue - posterior pharnyx Pharyngeal - Solids Pharyngeal - Puree: Trace aspiration;Penetration/Aspiration during swallow;Reduced pharyngeal peristalsis;Reduced anterior laryngeal mobility;Reduced laryngeal elevation;Premature spillage to pyriform sinuses Penetration/Aspiration details (puree): Material enters airway, passes BELOW cords then ejected out Pharyngeal - Mechanical Soft: Reduced pharyngeal peristalsis;Reduced anterior laryngeal mobility;Delayed swallow initiation;Premature spillage to pyriform sinuses;Reduced airway/laryngeal closure;Pharyngeal residue - posterior pharnyx;Pharyngeal residue - cp segment  Cervical Esophageal Phase    GO    Cervical Esophageal Phase Cervical Esophageal Phase: Impaired Cervical Esophageal Phase - Solids Pill: Reduced cricopharyngeal relaxation;Prominent cricopharyngeal segment Cervical Esophageal Phase - Comment Cervical Esophageal Comment: resided in distal portion of esophagus         Moreen Fowler MS, CCC-SLP [phone] [facility] 10/14/2012, 4:24 PM

## 2012-10-14 NOTE — Evaluation (Signed)
Physical Therapy Evaluation Patient Details Name: Aaron Johnson MRN: [id number] DOB: 08-Aug-1926 Today's Date: 10/14/2012 Time: 1026-1050 PT Time Calculation (min): 24 min  PT Assessment / Plan / Recommendation History of Present Illness  Pt. 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 He was brought to the ER for possible stroke.  MRI revealed Acute nonhemorrhagic left thalamic infarct  Clinical Impression  Patient admitted for new CVA.  Patient with old CVA with dense left hemiplegia.  Patient has definitely had a decline in functional ability and now requires more assistance.  Feel patient would benefit from CIR for further therapy to return to baseline level of function and ultimate return home.  Patient motivated to participate in therapy and achieve max potential.  Patient will benefit from PT to address mobility, balance,  neuromuscular re-education and patient/family education.    PT Assessment  Patient needs continued PT services    Follow Up Recommendations  CIR    Does the patient have the potential to tolerate intense rehabilitation      Barriers to Discharge Decreased caregiver support daughter works during day and patient alone during that time    Equipment Recommendations  None recommended by PT    Recommendations for Other Services Rehab consult   Frequency Min 4X/week    Precautions / Restrictions Precautions Precautions: Fall Type of Shoulder Precautions: patient with hemiplegia left side    Pertinent Vitals/Pain Denies pain.      Mobility  Bed Mobility Bed Mobility: Supine to Sit Supine to Sit: 3: Mod assist Details for Bed Mobility Assistance: able to initiate movement; required assistance to raise shoulders off bed and to reach midline when sitting Transfers Transfers: Sit to Stand;Stand to Sit;Stand Pivot Transfers Sit to Stand: 3: Mod assist;From bed;From chair/3-in-1;With upper extremity assist Stand to Sit: 3: Mod assist;With upper extremity assist;To chair/3-in-1 Stand Pivot Transfers: 3: Mod assist Ambulation/Gait Ambulation/Gait Assistance: 3: Mod assist Ambulation Distance (Feet): 1 Feet Assistive device: Straight cane Ambulation/Gait Assistance Details: patient unable to advance left LE; unable to reach full upright; attempted just 2-3 steps. Gait Pattern: Step-to pattern;Trunk flexed;Wide base of support Modified Rankin (Stroke Patients Only) Pre-Morbid Rankin Score: Moderate disability Modified Rankin: Severe disability    Exercises     PT Diagnosis: Hemiplegia non-dominant side  PT Problem List: Decreased strength;Decreased range of motion;Decreased activity tolerance;Decreased balance;Decreased mobility;Decreased safety awareness PT Treatment Interventions: DME instruction;Gait training;Functional mobility training;Therapeutic activities;Therapeutic exercise;Balance training;Neuromuscular re-education;Patient/family education     PT Goals(Current goals can be found in the care plan section) Acute Rehab PT Goals Patient Stated Goal: go back home PT Goal Formulation: With patient Time For Goal Achievement: 10/28/12 Potential to Achieve Goals: Good  Visit Information  Last PT Received On: 10/14/12 Assistance Needed: +2 History of Present Illness: Pt. with history of previous CVA causing dense left-sided hemiparesis and facial weakness, blind in left eye, chronic Diastolic heart failure with EF of 50%, CAD , aortic valve replacement With bioprosthetic valve,Hypertension, dyslipidemia  COPD who lives with his family was brought in by family members after they've noticed that he was  having more Droop in his face at his baseline and there was some drooling from the end of his mouth this started sometime last evening about 24 hours ago. He was brought to the ER for possible stroke.  MRI revealed Acute nonhemorrhagic left thalamic infarct       Prior Functioning  Home Living Family/patient expects to be discharged to:: Private residence Living Arrangements: Children Available Help at Discharge: Family;Available PRN/intermittently Type of Home: House Home Layout: One level Home Equipment:  Psychologist, educational) Prior Function Level of Independence: Independent with assistive device(s) Comments: per patient, transferred independently and ambulated with hemiwalker Communication Communication: Expressive difficulties    Cognition  Cognition Arousal/Alertness: Awake/alert Behavior During Therapy: WFL for tasks assessed/performed Overall Cognitive Status: Impaired/Different from baseline Area of Impairment: Safety/judgement Safety/Judgement: Decreased awareness of deficits    Extremity/Trunk Assessment Upper Extremity Assessment Upper Extremity Assessment: Defer to OT evaluation Lower Extremity Assessment Lower Extremity Assessment: LLE deficits/detail LLE Deficits / Details: decreased ROM LLE - unable to fully extend LLE and limited ankle ROM; increased tone LLE LLE Coordination: decreased gross motor   Balance Balance Balance Assessed: Yes Static Sitting Balance Static Sitting - Balance Support: Right upper extremity supported;Feet supported Static Sitting - Level of Assistance: 5: Stand by assistance Static Standing Balance Static Standing - Balance Support: Right upper extremity supported Static Standing - Level of Assistance: 3: Mod assist  End of Session PT - End of Session Equipment Utilized During Treatment: Gait belt Activity Tolerance: Patient tolerated treatment  well Patient left: in chair;with call bell/phone within reach;with chair alarm set;with nursing/sitter in room Nurse Communication: Mobility status  GP     Olivia Canter, [place] [phone] 10/14/2012, 11:03 AM

## 2012-10-14 NOTE — Progress Notes (Signed)
Stroke Team Progress Note  HISTORY JOHNPATRICK JENNY is an 79 y.o. male, right handed, with a past medical history significant for HTN, hypercholesterolemia, chronic diastolic CHF, s/p aortic valve replacement, stroke with residual left hemiparesis and dysarthria, blindness left eye, brought to [facility] ED by his daughter due to worsening left sided weakness and dysarthria.  She said that he is able to ambulate with a hemi walker, but last night she noticed that he was not able to use his left side as he usually does and couldn't even drink form a cup with the left hand. She stated that today he has been completely unable to use the left side. In addition, she thinks that his speech is significantly worse and he was drooling a lot earlier today.  Mr. Lorey denies HA, vertigo, confusion, or falls.  No recent fever or infection.  On aspirin and plavix daily.   Patient was not a TPA candidate secondary to delay in arrival. He was admitted to the neuro  floor for further evaluation and treatment.  SUBJECTIVE His daughter is at the bedside.  Overall he feels his condition is unchanged.    OBJECTIVE Most recent Vital Signs: Filed Vitals:   10/13/12 2300 10/14/12 0100 10/14/12 0300 10/14/12 0500  BP: 168/77 127/92 147/68 147/68  Pulse:      Temp:  98.4 F (36.9 C) 98.2 F (36.8 C) 98.2 F (36.8 C)  TempSrc:  Oral Oral Oral  Resp: 20 20 18 20   Height:      Weight:      SpO2: 98% 98% 98% 98%   CBG (last 3)   Recent Labs  10/13/12 1404  GLUCAP 85    IV Fluid Intake:   . sodium chloride 35 mL/hr at 10/13/12 2253    MEDICATIONS  . aspirin  81 mg Oral Daily  . atorvastatin  40 mg Oral q1800  . brimonidine  1 drop Both Eyes TID  . clopidogrel  75 mg Oral Daily  . dorzolamide-timolol  1 drop Both Eyes BID  . heparin  5,000 Units Subcutaneous Q8H  . influenza vac split quadrivalent PF  0.5 mL Intramuscular Tomorrow-1000  . latanoprost  1 drop Both Eyes QHS  . mometasone-formoterol  2 puff  Inhalation BID  . sodium chloride  3 mL Intravenous Q12H   PRN:  acetaminophen, acetaminophen, albuterol, guaiFENesin-dextromethorphan, morphine injection, ondansetron (ZOFRAN) IV, ondansetron, polyethylene glycol  Diet:  NPO   Activity:  Bedrest   DVT Prophylaxis:  SCDs CLINICALLY SIGNIFICANT STUDIES Basic Metabolic Panel:  Recent Labs Lab 10/13/12 1630 10/13/12 2004  NA 140  --   K 4.9  --   CL 106  --   CO2 22  --   GLUCOSE 81  --   BUN 20  --   CREATININE 0.79 0.80  CALCIUM 9.3  --    Liver Function Tests:  Recent Labs Lab 10/13/12 1630  AST 39*  ALT 23  ALKPHOS 80  BILITOT 0.8  PROT 8.4*  ALBUMIN 4.1   CBC:  Recent Labs Lab 10/13/12 1630 10/13/12 2004  WBC 7.1 5.9  NEUTROABS 3.8  --   HGB 12.5* 11.2*  HCT 38.0* 33.6*  MCV 87.0 84.8  PLT 76* 89*   Coagulation:  Recent Labs Lab 10/13/12 1630  LABPROT 14.4  INR 1.14   Cardiac Enzymes:  Recent Labs Lab 10/13/12 1630  TROPONINI <0.30   Urinalysis:  Recent Labs Lab 10/13/12 1511  COLORURINE YELLOW  LABSPEC 1.011  PHURINE 6.0  GLUCOSEU NEGATIVE  HGBUR NEGATIVE  BILIRUBINUR NEGATIVE  KETONESUR NEGATIVE  PROTEINUR NEGATIVE  UROBILINOGEN 0.2  NITRITE NEGATIVE  LEUKOCYTESUR NEGATIVE   Lipid Panel    Component Value Date/Time   CHOL  Value: 119        ATP III CLASSIFICATION:  <200     mg/dL   Desirable  161-096  mg/dL   Borderline High  >=045    mg/dL   High        04/12/8117 0548   TRIG 18 05/03/2010 0548   HDL 70 05/03/2010 0548   CHOLHDL 1.7 05/03/2010 0548   VLDL 4 05/03/2010 0548   LDLCALC  Value: 45        Total Cholesterol/HDL:CHD Risk Coronary Heart Disease Risk Table                     Men   Women  1/2 Average Risk   3.4   3.3  Average Risk       5.0   4.4  2 X Average Risk   9.6   7.1  3 X Average Risk  23.4   11.0        Use the calculated Patient Ratio above and the CHD Risk Table to determine the patient's CHD Risk.        ATP III CLASSIFICATION (LDL):  <100     mg/dL   Optimal   147-829  mg/dL   Near or Above                    Optimal  130-159  mg/dL   Borderline  562-130  mg/dL   High  >865     mg/dL   Very High 7/84/6962 9528   HgbA1C  Lab Results  Component Value Date   HGBA1C  Value: 5.3 (NOTE)                                                                       According to the ADA Clinical Practice Recommendations for 2011, when HbA1c is used as a screening test:   >=6.5%   Diagnostic of Diabetes Mellitus           (if abnormal result  is confirmed)  5.7-6.4%   Increased risk of developing Diabetes Mellitus  References:Diagnosis and Classification of Diabetes Mellitus,Diabetes Care,2011,34(Suppl 1):S62-S69 and Standards of Medical Care in         Diabetes - 2011,Diabetes Care,2011,34  (Suppl 1):S11-S61. 05/03/2010    Urine Drug Screen:     Component Value Date/Time   LABOPIA NONE DETECTED 05/02/2010 2124   COCAINSCRNUR NONE DETECTED 05/02/2010 2124   LABBENZ NONE DETECTED 05/02/2010 2124   AMPHETMU NONE DETECTED 05/02/2010 2124   THCU NONE DETECTED 05/02/2010 2124   LABBARB  Value: NONE DETECTED        DRUG SCREEN FOR MEDICAL PURPOSES ONLY.  IF CONFIRMATION IS NEEDED FOR ANY PURPOSE, NOTIFY LAB WITHIN 5 DAYS.        LOWEST DETECTABLE LIMITS FOR URINE DRUG SCREEN Drug Class       Cutoff (ng/mL) Amphetamine      1000 Barbiturate      200 Benzodiazepine   200 Tricyclics  300 Opiates          300 Cocaine          300 THC              50 05/02/2010 2124    Alcohol Level: No results found for this basename: ETH,  in the last 168 hours  Ct Head Wo Contrast  10/13/2012   CLINICAL DATA:  Stroke symptoms.  EXAM: CT HEAD WITHOUT CONTRAST  TECHNIQUE: Contiguous axial images were obtained from the base of the skull through the vertex without intravenous contrast.  COMPARISON:  Multiple priors.  FINDINGS: No acute intracranial hemorrhage, mass lesion, brain edema or extra-axial fluid collection is identified. There is stable encephalomalacia in the right frontal lobe with  Wallerian degeneration and ipsilateral ventricular dilatation. Cerebral atrophy and chronic small vessel ischemic change. There is no evidence of acute cortical infarct. The visualized paranasal sinuses are clear. The calvarium is intact.  IMPRESSION: No acute intracranial abnormality. Stable chronic right frontal infarct with associated encephalomalacia   Electronically Signed   By: Jerene Dilling M.D.   On: 10/13/2012 14:47   Mr Brain Wo Contrast  10/13/2012   CLINICAL DATA:  New left-sided weakness.  EXAM: MRI HEAD WITHOUT CONTRAST  TECHNIQUE: Multiplanar, multisequence MR imaging was performed. No intravenous contrast was administered.  COMPARISON:  10/13/2012 CT. 05/02/2012 MR.  FINDINGS: Exam is motion degraded. Patient was not able to complete all sequences.  Acute nonhemorrhagic left thalamic infarct.  Remote large right hemispheric infarct with encephalomalacia once again noted.  Global atrophy without hydrocephalus.  Small vessel disease type changes.  No obvious mass detected on this motion degraded unenhanced exam.  Occluded right internal carotid artery. This was noted previously.  IMPRESSION: Acute nonhemorrhagic left thalamic infarct.  Please see above.  These results will be called to the ordering clinician or representative by the Radiologist Assistant, and communication documented in the PACS Dashboard.   Electronically Signed   By: Bridgett Larsson M.D.   On: 10/13/2012 18:31        2D Echocardiogram  pending  Carotid Doppler  pending  CXR  Not ordered  EKG  Sinus bradycardia with 1st degree A-V block with occasional Premature ventricular complexes Left ventricular hypertrophy Junctional ST depression, probably abnormal Therapy Recommendations pending Physical Exam   Frail, cachectic african american elderly male not in distress.Awake alert. Afebrile. Head is nontraumatic. Neck is supple without bruit. Hearing is normal. Cardiac exam no murmur or gallop. Lungs are clear to  auscultation. Distal pulses are well felt. Neurological Exam ;  Awake alert dysarthric but can be understood.eye movements left lower face weak. Tongue midline. Spastic left hemiplegia 0/5 LUE and 3/5 LLE strength.Rt side normal strength. Decrease sensation left hemibody. ASSESSMENT Mr. NOAM KARAFFA is a 79 y.o. male presenting with dysphagia, dysarthria and old left HP  Imaging confirms a left thalamic infarct. Infarct felt to be thrombotic  secondary to small vessel disease.  On aspirin 81 mg orally every day and clopidogrel 75 mg orally every day prior to admission. Now on aspirin 81 mg orally every day for secondary stroke prevention. Patient with resultant dysphgia, dysarthria. Work up underway.      Hospital day # 1  TREATMENT/PLAN  Continue aspirin 81 mg orally every day for secondary stroke prevention.   Check echo,dopplers, mobilize out of bed,PT/OT/ST consults  D/W daughter Delia Heady, MD    10/14/2012 9:08 AM

## 2012-10-14 NOTE — Progress Notes (Addendum)
Subjective: Aaron Johnson is alert and conversive but speech is slurred.  He has a dense left upper extremity hemiplegia.  Apparently is ambulatory with some form of walker. No longer drooling.  MRI reveals an acute left thalamic stroke.  He mentions that he is not speaking as well as he normally does  Objective: Weight change:   Intake/Output Summary (Last 24 hours) at 10/14/12 1022 Last data filed at 10/14/12 0554  Gross per 24 hour  Intake 245.59 ml  Output      0 ml  Net 245.59 ml   Filed Vitals:   10/13/12 2300 10/14/12 0100 10/14/12 0300 10/14/12 0500  BP: 168/77 127/92 147/68 147/68  Pulse:      Temp:  98.4 F (36.9 C) 98.2 F (36.8 C) 98.2 F (36.8 C)  TempSrc:  Oral Oral Oral  Resp: 20 20 18 20   Height:      Weight:      SpO2: 98% 98% 98% 98%    General Appearance: Alert, cooperative, no distress, appears stated age, speech is slurred Lungs: Clear to auscultation bilaterally, respirations unlabored Chest: A healed sternal scar Heart: Regular rate and rhythm, S1 and S2 normal, no murmur, rub or gallop Abdomen: Soft, non-tender, bowel sounds active all four quadrants, no masses, no organomegaly Extremities: No edema or cyanosis.  Left upper extremity with contracture but he is able to move his left lower extremity and there is no contracture there. Neuro: Oriented x2-3, alert.  Left upper extremity hemiplegia greater than left lower extremity. Slurred speech  Lab Results: Results for orders placed during the hospital encounter of 10/13/12 (from the past 48 hour(s))  GLUCOSE, CAPILLARY     Status: None   Collection Time    10/13/12  2:04 PM      Result Value Range   Glucose-Capillary 85  70 - 99 mg/dL   Comment 1 Documented in Chart     Comment 2 Notify RN    URINALYSIS, ROUTINE W REFLEX MICROSCOPIC     Status: None   Collection Time    10/13/12  3:11 PM      Result Value Range   Color, Urine YELLOW  YELLOW   APPearance CLEAR  CLEAR   Specific Gravity, Urine  1.011  1.005 - 1.030   pH 6.0  5.0 - 8.0   Glucose, UA NEGATIVE  NEGATIVE mg/dL   Hgb urine dipstick NEGATIVE  NEGATIVE   Bilirubin Urine NEGATIVE  NEGATIVE   Ketones, ur NEGATIVE  NEGATIVE mg/dL   Protein, ur NEGATIVE  NEGATIVE mg/dL   Urobilinogen, UA 0.2  0.0 - 1.0 mg/dL   Nitrite NEGATIVE  NEGATIVE   Leukocytes, UA NEGATIVE  NEGATIVE   Comment: MICROSCOPIC NOT DONE ON URINES WITH NEGATIVE PROTEIN, BLOOD, LEUKOCYTES, NITRITE, OR GLUCOSE <1000 mg/dL.  POCT I-STAT TROPONIN I     Status: None   Collection Time    10/13/12  4:20 PM      Result Value Range   Troponin i, poc 0.00  0.00 - 0.08 ng/mL   Comment 3            Comment: Due to the release kinetics of cTnI,     a negative result within the first hours     of the onset of symptoms does not rule out     myocardial infarction with certainty.     If myocardial infarction is still suspected,     repeat the test at appropriate intervals.  PROTIME-INR  Status: None   Collection Time    10/13/12  4:30 PM      Result Value Range   Prothrombin Time 14.4  11.6 - 15.2 seconds   INR 1.14  0.00 - 1.49  APTT     Status: None   Collection Time    10/13/12  4:30 PM      Result Value Range   aPTT 31  24 - 37 seconds  CBC     Status: Abnormal   Collection Time    10/13/12  4:30 PM      Result Value Range   WBC 7.1  4.0 - 10.5 K/uL   RBC 4.37  4.22 - 5.81 MIL/uL   Hemoglobin 12.5 (*) 13.0 - 17.0 g/dL   HCT 16.1 (*) 09.6 - 04.5 %   MCV 87.0  78.0 - 100.0 fL   MCH 28.6  26.0 - 34.0 pg   MCHC 32.9  30.0 - 36.0 g/dL   RDW 40.9  81.1 - 91.4 %   Platelets 76 (*) 150 - 400 K/uL   Comment: REPEATED TO VERIFY     SPECIMEN CHECKED FOR CLOTS     PLATELET COUNT CONFIRMED BY SMEAR  DIFFERENTIAL     Status: None   Collection Time    10/13/12  4:30 PM      Result Value Range   Neutrophils Relative % 54  43 - 77 %   Lymphocytes Relative 33  12 - 46 %   Monocytes Relative 8  3 - 12 %   Eosinophils Relative 4  0 - 5 %   Basophils  Relative 1  0 - 1 %   Neutro Abs 3.8  1.7 - 7.7 K/uL   Lymphs Abs 2.3  0.7 - 4.0 K/uL   Monocytes Absolute 0.6  0.1 - 1.0 K/uL   Eosinophils Absolute 0.3  0.0 - 0.7 K/uL   Basophils Absolute 0.1  0.0 - 0.1 K/uL   RBC Morphology ELLIPTOCYTES     Comment: ACANTHOCYTES   Smear Review LARGE PLATELETS PRESENT    COMPREHENSIVE METABOLIC PANEL     Status: Abnormal   Collection Time    10/13/12  4:30 PM      Result Value Range   Sodium 140  135 - 145 mEq/L   Potassium 4.9  3.5 - 5.1 mEq/L   Comment: HEMOLYSIS AT THIS LEVEL MAY AFFECT RESULT   Chloride 106  96 - 112 mEq/L   CO2 22  19 - 32 mEq/L   Glucose, Bld 81  70 - 99 mg/dL   BUN 20  6 - 23 mg/dL   Creatinine, Ser 7.82  0.50 - 1.35 mg/dL   Calcium 9.3  8.4 - 95.6 mg/dL   Total Protein 8.4 (*) 6.0 - 8.3 g/dL   Albumin 4.1  3.5 - 5.2 g/dL   AST 39 (*) 0 - 37 U/L   ALT 23  0 - 53 U/L   Alkaline Phosphatase 80  39 - 117 U/L   Total Bilirubin 0.8  0.3 - 1.2 mg/dL   GFR calc non Af Amer 80 (*) >90 mL/min   GFR calc Af Amer >90  >90 mL/min   Comment: (NOTE)     The eGFR has been calculated using the CKD EPI equation.     This calculation has not been validated in all clinical situations.     eGFR's persistently <90 mL/min signify possible Chronic Kidney     Disease.  TROPONIN I  Status: None   Collection Time    10/13/12  4:30 PM      Result Value Range   Troponin I <0.30  <0.30 ng/mL   Comment:            Due to the release kinetics of cTnI,     a negative result within the first hours     of the onset of symptoms does not rule out     myocardial infarction with certainty.     If myocardial infarction is still suspected,     repeat the test at appropriate intervals.  CBC     Status: Abnormal   Collection Time    10/13/12  8:04 PM      Result Value Range   WBC 5.9  4.0 - 10.5 K/uL   RBC 3.96 (*) 4.22 - 5.81 MIL/uL   Hemoglobin 11.2 (*) 13.0 - 17.0 g/dL   HCT 16.1 (*) 09.6 - 04.5 %   MCV 84.8  78.0 - 100.0 fL   MCH 28.3   26.0 - 34.0 pg   MCHC 33.3  30.0 - 36.0 g/dL   RDW 40.9  81.1 - 91.4 %   Platelets 89 (*) 150 - 400 K/uL   Comment: CONSISTENT WITH PREVIOUS RESULT     SPECIMEN CHECKED FOR CLOTS  CREATININE, SERUM     Status: Abnormal   Collection Time    10/13/12  8:04 PM      Result Value Range   Creatinine, Ser 0.80  0.50 - 1.35 mg/dL   GFR calc non Af Amer 80 (*) >90 mL/min   GFR calc Af Amer >90  >90 mL/min   Comment: (NOTE)     The eGFR has been calculated using the CKD EPI equation.     This calculation has not been validated in all clinical situations.     eGFR's persistently <90 mL/min signify possible Chronic Kidney     Disease.  TSH     Status: None   Collection Time    10/13/12  8:04 PM      Result Value Range   TSH 2.861  0.350 - 4.500 uIU/mL   Comment: Performed at [facility]  BASIC METABOLIC PANEL     Status: Abnormal   Collection Time    10/14/12  9:10 AM      Result Value Range   Sodium 141  135 - 145 mEq/L   Potassium 3.4 (*) 3.5 - 5.1 mEq/L   Chloride 106  96 - 112 mEq/L   CO2 23  19 - 32 mEq/L   Glucose, Bld 87  70 - 99 mg/dL   BUN 16  6 - 23 mg/dL   Creatinine, Ser 7.82  0.50 - 1.35 mg/dL   Calcium 8.9  8.4 - 95.6 mg/dL   GFR calc non Af Amer 80 (*) >90 mL/min   GFR calc Af Amer >90  >90 mL/min   Comment: (NOTE)     The eGFR has been calculated using the CKD EPI equation.     This calculation has not been validated in all clinical situations.     eGFR's persistently <90 mL/min signify possible Chronic Kidney     Disease.  CBC     Status: Abnormal   Collection Time    10/14/12  9:10 AM      Result Value Range   WBC 7.4  4.0 - 10.5 K/uL   RBC 4.00 (*) 4.22 - 5.81 MIL/uL   Hemoglobin 11.3 (*)  13.0 - 17.0 g/dL   HCT 40.9 (*) 81.1 - 91.4 %   MCV 84.3  78.0 - 100.0 fL   MCH 28.3  26.0 - 34.0 pg   MCHC 33.5  30.0 - 36.0 g/dL   RDW 78.2  95.6 - 21.3 %   Platelets 91 (*) 150 - 400 K/uL   Comment: CONSISTENT WITH PREVIOUS RESULT    Studies/Results: Dg  Chest 2 View  10/14/2012   CLINICAL DATA:  79 year old male with CVA. History of aortic valve replacement.  EXAM: CHEST  2 VIEW  COMPARISON:  06/23/2011 and prior chest radiographs dating back to 04/19/2004  FINDINGS: Cardiomegaly and a cardiac valve replacement changes noted.  This is a mildly low volume film.  Fullness of the superior mediastinum is unchanged.  There is no evidence of focal airspace disease, pulmonary edema, suspicious pulmonary nodule/mass, pleural effusion, or pneumothorax. No acute bony abnormalities are identified.  IMPRESSION: No evidence of acute cardiopulmonary disease.  Cardiomegaly and cardiac valve replacement.   Electronically Signed   By: Laveda Abbe M.D.   On: 10/14/2012 09:50   Ct Head Wo Contrast  10/13/2012   CLINICAL DATA:  Stroke symptoms.  EXAM: CT HEAD WITHOUT CONTRAST  TECHNIQUE: Contiguous axial images were obtained from the base of the skull through the vertex without intravenous contrast.  COMPARISON:  Multiple priors.  FINDINGS: No acute intracranial hemorrhage, mass lesion, brain edema or extra-axial fluid collection is identified. There is stable encephalomalacia in the right frontal lobe with Wallerian degeneration and ipsilateral ventricular dilatation. Cerebral atrophy and chronic small vessel ischemic change. There is no evidence of acute cortical infarct. The visualized paranasal sinuses are clear. The calvarium is intact.  IMPRESSION: No acute intracranial abnormality. Stable chronic right frontal infarct with associated encephalomalacia   Electronically Signed   By: Jerene Dilling M.D.   On: 10/13/2012 14:47   Mr Brain Wo Contrast  10/13/2012   CLINICAL DATA:  New left-sided weakness.  EXAM: MRI HEAD WITHOUT CONTRAST  TECHNIQUE: Multiplanar, multisequence MR imaging was performed. No intravenous contrast was administered.  COMPARISON:  10/13/2012 CT. 05/02/2012 MR.  FINDINGS: Exam is motion degraded. Patient was not able to complete all sequences.   Acute nonhemorrhagic left thalamic infarct.  Remote large right hemispheric infarct with encephalomalacia once again noted.  Global atrophy without hydrocephalus.  Small vessel disease type changes.  No obvious mass detected on this motion degraded unenhanced exam.  Occluded right internal carotid artery. This was noted previously.  IMPRESSION: Acute nonhemorrhagic left thalamic infarct.  Please see above.  These results will be called to the ordering clinician or representative by the Radiologist Assistant, and communication documented in the PACS Dashboard.   Electronically Signed   By: Bridgett Larsson M.D.   On: 10/13/2012 18:31   Medications: Scheduled Meds: . aspirin  81 mg Oral Daily  . atorvastatin  40 mg Oral q1800  . brimonidine  1 drop Both Eyes TID  . clopidogrel  75 mg Oral Daily  . dorzolamide-timolol  1 drop Both Eyes BID  . heparin  5,000 Units Subcutaneous Q8H  . influenza vac split quadrivalent PF  0.5 mL Intramuscular Tomorrow-1000  . latanoprost  1 drop Both Eyes QHS  . mometasone-formoterol  2 puff Inhalation BID  . sodium chloride  3 mL Intravenous Q12H   Continuous Infusions: . sodium chloride 35 mL/hr at 10/13/12 2253   PRN Meds:.acetaminophen, acetaminophen, albuterol, guaiFENesin-dextromethorphan, morphine injection, ondansetron (ZOFRAN) IV, ondansetron, polyethylene glycol  Assessment/Plan:  1.Acute  New Left Thalamic Infarct -  Dual antiplatelet therapy of aspirin Plavix will be continued along with his home dose statin. Further recommendations based on his studies to come from neurology.  2. Chronic Diastolic heart failure EF of 50%. Currently appears compensated.  3.Dyslipidemia. Lipid panel pending. home to statin to be continued for now.  4.Hypertension stable for now he's not on any schedule and hypertensive medications, we will monitor and adjust medications as needed.  5. History of CAD/aortic valve replacement which is bioprosthetic. No acute issues, He is  symptom free supportive care will be provided  6. COPD. Compensated not short of breath no wheezing. Nebulizer treatments and oxygen as needed. DVT Prophylaxis Heparin   Family Communication: Patients condition and plan of care including tests being ordered have been discussed with the patient who indicates understanding and agree with the plan and Code Status.  Code Status Full   Disposition: May need skilled nursing facility    LOS: 1 day   Pearla Dubonnet, MD 10/14/2012, 10:22 AM

## 2012-10-14 NOTE — Evaluation (Signed)
Speech Language Pathology Evaluation Patient Details Name: Aaron Johnson MRN: [id number] DOB: 19-Aug-1926 Today's Date: 10/14/2012 Time:  -     Problem List:  Patient Active Problem List   Diagnosis Date Noted  . Left hemiparesis 10/13/2012  . Hemiparesis, left   . Chest pain 06/23/2011  . PERSONAL HISTORY OF COLONIC POLYPS 07/11/2009  . HYPERLIPIDEMIA 06/06/2008  . AORTIC VALVE REPLACEMENT, HX OF 04/03/2008  . COLON POLYP 03/03/2006  . HYPERCHOLESTEROLEMIA 03/03/2006  . ANEMIA, IRON DEFICIENCY, UNSPEC. 03/03/2006  . HYPERTENSION, BENIGN SYSTEMIC 03/03/2006  . CORONARY, ARTERIOSCLEROSIS 03/03/2006  . AORTIC VALVE INSUFFICIENCY 03/03/2006  . CHF, EJECTION FRACTION > OR = 50% 03/03/2006  . COPD 03/03/2006  . CEREBROVAS DIS, LATE EFFECTS (S/P CVA) 03/03/2006  . ANGIOEDEMA 03/03/2006   Past Medical History:  Past Medical History  Diagnosis Date  . Acquired musculoskeletal deformity of unspecified site   . Chronic diastolic heart failure     EF >= 50%  . HTN (hypertension), benign     systematic  . Pure hypercholesterolemia   . COPD (chronic obstructive pulmonary disease)   . Benign neoplasm of colon   . 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 Iron deficiency anemia, unspecified   . LFTs abnormal 3/06    LFTs wnl, Cr - 1.3 stable. 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 colonoscopy 3/05- adenomatous polyps, mild gastritis-diffuse interosseous skeletal hypertosis (DISH), h/o H. pylori s/p tx.  . Stroke   . Hemiparesis, left   . Aortic valve insufficiency    Past Surgical History:  Past Surgical History  Procedure Laterality Date  . Cardiac catheterization  1/99    mild CAD; EF 35-40%  . Aortic valve replacement      with pericardial tissue valve  . Cardiac valve replacement    . Multiple tooth extractions     HPI:  Aaron Johnson is an 79 y.o.  male, right handed, with a past medical history significant for HTN, hypercholesterolemia, chronic diastolic CHF, s/p aortic valve replacement, stroke with residual left hemiparesis and dysarthria, blindness left eye, brought to [facility] ED by his daughter due to worsening left sided weakness and dysarthria.  MRI 10/10/Acute nonhemorrhagic left thalamic infarct. Patient received assist with all basic and complex ADL's prior to current hospitalization.  Cognitive Linguistic Evaluation ordered per Stroke Protocol.      Assessment / Plan / Recommendation Clinical Impression  Cognitive Linguistic Evaluation completed. Intelligibility of speech affected due to combination of dysarthria and lack of dentition.  Minimal to moderate  cognitive deficits in areas of emergent and anticipatory awareness of current physical deficits, decreased safety awareness, functional problem solving, and short term memory.   Problem solving skills for basic ADL's further affected by visual deficits.   Cognitive deficits noted but Skilled ST in acute care setting not warranted at this time as patient receiving necessary supervision.  Patient to benefit from [facility] consult.      SLP Assessment  All further Speech Lanaguage Pathology  needs can be addressed in the next venue of care    Follow Up Recommendations  Inpatient Rehab    Frequency and Duration min 2x/week       SLP Evaluation Prior Functioning  Cognitive/Linguistic Baseline: Baseline deficits Baseline deficit details: Required assist with all complex ADL's Type of Home: House  Lives With: Daughter Available Help at Discharge: Family;Available PRN/intermittently Vocation: Retired  Cognition  Overall Cognitive Status: Impaired/Different from baseline Arousal/Alertness: Awake/alert Orientation Level: Oriented X4 Attention: Sustained Sustained Attention: Impaired Sustained Attention Impairment: Functional basic Memory: Impaired Memory Impairment: Decreased short  term memory Decreased Short Term Memory: Verbal basic;Functional basic Awareness: Impaired Awareness Impairment: Emergent impairment;Anticipatory impairment Problem Solving: Impaired Problem Solving Impairment: Functional basic Safety/Judgment: Impaired    Comprehension  Auditory Comprehension Overall Auditory Comprehension: Impaired Commands: Within Functional Limits Conversation: Simple Interfering Components: Visual impairments;Hearing EffectiveTechniques: Increased volume Visual Recognition/Discrimination Discrimination: Not tested Reading Comprehension Reading Status: Impaired Interfering Components: Visual scanning;Visual acuity    Expression Verbal Expression Overall Verbal Expression: Appears within functional limits for tasks assessed Written Expression Dominant Hand: Right Written Expression: Not tested   Oral / Motor Oral Motor/Sensory Function Overall Oral Motor/Sensory Function: Impaired Labial ROM: Reduced right Labial Symmetry: Abnormal symmetry right Labial Strength: Reduced Labial Sensation: Reduced Lingual ROM: Reduced right Lingual Symmetry: Abnormal symmetry right Lingual Strength: Reduced Lingual Sensation: Reduced Facial ROM: Reduced right Facial Symmetry: Right droop Facial Strength: Reduced Facial Sensation: Reduced Velum: Within Functional Limits Mandible: Within Functional Limits Motor Speech Overall Motor Speech: Impaired Phonation: Hoarse Articulation: Impaired Level of Impairment: Sentence Intelligibility: Intelligibility reduced Word: 75-100% accurate Phrase: 50-74% accurate Sentence: 50-74% accurate Conversation: 50-74% accurate Motor Planning: Witnin functional limits   GO    Moreen Fowler MS, CCC-SLP [phone] [facility] 10/14/2012, 12:50 PM

## 2012-10-14 NOTE — Progress Notes (Signed)
  Echocardiogram 2D Echocardiogram has been performed.  Aaron Johnson 10/14/2012, 2:00 PM

## 2012-10-14 NOTE — Progress Notes (Signed)
Rehab Admissions Coordinator Note:  Patient was screened by Clois Dupes for appropriateness for an Inpatient Acute Rehab Consult.  Therapy is recommending an inpt rehab consult. Please order if you feel appropriate and we will evaluate pt for the possibility for admission.  Clois Dupes 10/14/2012, 11:27 AM  I can be reached at [phone].

## 2012-10-15 ENCOUNTER — Inpatient Hospital Stay (HOSPITAL_COMMUNITY): Payer: PRIVATE HEALTH INSURANCE

## 2012-10-15 DIAGNOSIS — I6350 Cerebral infarction due to unspecified occlusion or stenosis of unspecified cerebral artery: Principal | ICD-10-CM

## 2012-10-15 MED ORDER — LORAZEPAM 2 MG/ML IJ SOLN
INTRAMUSCULAR | Status: AC
  Administered 2012-10-15: 1 mg via INTRAVENOUS
  Filled 2012-10-15: qty 1

## 2012-10-15 MED ORDER — LOSARTAN POTASSIUM 50 MG PO TABS
50.0000 mg | ORAL_TABLET | Freq: Every day | ORAL | Status: DC
  Administered 2012-10-15 – 2012-10-16 (×2): 50 mg via ORAL
  Filled 2012-10-15 (×2): qty 1

## 2012-10-15 MED ORDER — LORAZEPAM BOLUS VIA INFUSION: 1.0000 mg | Freq: Once | INTRAVENOUS | Status: DC

## 2012-10-15 MED ORDER — LORAZEPAM 2 MG/ML IJ SOLN
1.0000 mg | Freq: Once | INTRAMUSCULAR | Status: AC
  Administered 2012-10-15: 1 mg via INTRAVENOUS
  Filled 2012-10-15: qty 1

## 2012-10-15 MED ORDER — LORAZEPAM 2 MG/ML IJ SOLN
1.0000 mg | Freq: Once | INTRAMUSCULAR | Status: AC
  Administered 2012-10-15: 1 mg via INTRAVENOUS

## 2012-10-15 NOTE — Progress Notes (Signed)
1230 Pt down to MRA unable to perform test d/t pt refusal to lie still TC MD order for Ativan down to MRI to administer 1mg  Ativan

## 2012-10-15 NOTE — Progress Notes (Signed)
Stroke Team Progress Note  HISTORY Aaron Johnson is an 79 y.o. 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  She said that he is able to ambulate with a hemi walker, but last night she noticed that he was not able to use his right side as he usually does and couldn't even drink form a cup with the right hand. She stated that today he has been completely unable to use the right side. In addition, she thinks that his speech is significantly worse and he was drooling a lot earlier today.  [name] denies HA, vertigo, confusion, or falls.  No recent fever or infection.  On aspirin and plavix daily.   Patient was not a TPA candidate secondary to delay in arrival. He was admitted to the neuro  floor for further evaluation and treatment.  SUBJECTIVE His daughter is at the bedside.  Overall he feels his condition is unchanged.  The patient requires assistance with eating.  OBJECTIVE Most recent Vital Signs: Filed Vitals:   10/14/12 1513 10/14/12 2000 10/15/12 0000 10/15/12 0400  BP: 130/51 132/96 145/69 120/46  Pulse: 64 98 68 78  Temp: 98.1 F (36.7 C) 99.3 F (37.4 C) 97.7 F (36.5 C) 98.8 F (37.1 C)  TempSrc: Oral   Oral  Resp: 18 18 18 16   Height:      Weight:      SpO2: 100% 100% 98% 92%   CBG (last 3)   Recent Labs  10/13/12 1404  GLUCAP 85    IV Fluid Intake:      MEDICATIONS  . aspirin  81 mg Oral Daily  . atorvastatin  40 mg Oral q1800  . brimonidine  1 drop Both Eyes TID  . clopidogrel  75 mg Oral Daily  . dorzolamide-timolol  1 drop Both Eyes BID  . heparin  5,000 Units Subcutaneous Q8H  . latanoprost  1 drop Both Eyes QHS  . mometasone-formoterol  2 puff Inhalation BID  . sodium chloride  3 mL  Intravenous Q12H   PRN:  acetaminophen, acetaminophen, albuterol, guaiFENesin-dextromethorphan, morphine injection, ondansetron (ZOFRAN) IV, ondansetron, polyethylene glycol  Diet:  Dysphagia   Activity:  Bedrest   DVT Prophylaxis:  SCDs CLINICALLY SIGNIFICANT STUDIES Basic Metabolic Panel:   Recent Labs Lab 10/13/12 1630 10/13/12 2004 10/14/12 0910  NA 140  --  141  K 4.9  --  3.4*  CL 106  --  106  CO2 22  --  23  GLUCOSE 81  --  87  BUN 20  --  16  CREATININE 0.79 0.80 0.80  CALCIUM 9.3  --  8.9   Liver Function Tests:   Recent Labs Lab 10/13/12 1630  AST 39*  ALT 23  ALKPHOS 80  BILITOT 0.8  PROT 8.4*  ALBUMIN 4.1   CBC:   Recent Labs Lab 10/13/12 1630 10/13/12 2004 10/14/12 0910  WBC 7.1 5.9 7.4  NEUTROABS 3.8  --   --   HGB 12.5* 11.2* 11.3*  HCT 38.0* 33.6* 33.7*  MCV 87.0 84.8 84.3  PLT 76* 89* 91*   Coagulation:   Recent Labs Lab 10/13/12 1630  LABPROT 14.4  INR 1.14   Cardiac Enzymes:   Recent Labs Lab 10/13/12 1630  TROPONINI <0.30   Urinalysis:   Recent  Labs Lab 10/13/12 1511  COLORURINE YELLOW  LABSPEC 1.011  PHURINE 6.0  GLUCOSEU NEGATIVE  HGBUR NEGATIVE  BILIRUBINUR NEGATIVE  KETONESUR NEGATIVE  PROTEINUR NEGATIVE  UROBILINOGEN 0.2  NITRITE NEGATIVE  LEUKOCYTESUR NEGATIVE   Lipid Panel    Component Value Date/Time   CHOL 140 10/14/2012 0910   TRIG 43 10/14/2012 0910   HDL 76 10/14/2012 0910   CHOLHDL 1.8 10/14/2012 0910   VLDL 9 10/14/2012 0910   LDLCALC 55 10/14/2012 0910   HgbA1C  Lab Results  Component Value Date   HGBA1C 5.2 10/14/2012    Urine Drug Screen:     Component Value Date/Time   LABOPIA NONE DETECTED 05/02/2010 2124   COCAINSCRNUR NONE DETECTED 05/02/2010 2124   LABBENZ NONE DETECTED 05/02/2010 2124   AMPHETMU NONE DETECTED 05/02/2010 2124   THCU NONE DETECTED 05/02/2010 2124   LABBARB  Value: NONE DETECTED        DRUG SCREEN FOR MEDICAL PURPOSES ONLY.  IF CONFIRMATION IS NEEDED FOR ANY  PURPOSE, NOTIFY LAB WITHIN 5 DAYS.        LOWEST DETECTABLE LIMITS FOR URINE DRUG SCREEN Drug Class       Cutoff (ng/mL) Amphetamine      1000 Barbiturate      200 Benzodiazepine   200 Tricyclics       300 Opiates          300 Cocaine          300 THC              50 05/02/2010 2124    Alcohol Level: No results found for this basename: ETH,  in the last 168 hours  Dg Chest 2 View  10/14/2012   CLINICAL DATA:  79 year old male with CVA. History of aortic valve replacement.  EXAM: CHEST  2 VIEW  COMPARISON:  06/23/2011 and prior chest radiographs dating back to 04/19/2004  FINDINGS: Cardiomegaly and a cardiac valve replacement changes noted.  This is a mildly low volume film.  Fullness of the superior mediastinum is unchanged.  There is no evidence of focal airspace disease, pulmonary edema, suspicious pulmonary nodule/mass, pleural effusion, or pneumothorax. No acute bony abnormalities are identified.  IMPRESSION: No evidence of acute cardiopulmonary disease.  Cardiomegaly and cardiac valve replacement.   Electronically Signed   By: Laveda Abbe M.D.   On: 10/14/2012 09:50   Ct Head Wo Contrast  10/13/2012   CLINICAL DATA:  Stroke symptoms.  EXAM: CT HEAD WITHOUT CONTRAST  TECHNIQUE: Contiguous axial images were obtained from the base of the skull through the vertex without intravenous contrast.  COMPARISON:  Multiple priors.  FINDINGS: No acute intracranial hemorrhage, mass lesion, brain edema or extra-axial fluid collection is identified. There is stable encephalomalacia in the right frontal lobe with Wallerian degeneration and ipsilateral ventricular dilatation. Cerebral atrophy and chronic small vessel ischemic change. There is no evidence of acute cortical infarct. The visualized paranasal sinuses are clear. The calvarium is intact.  IMPRESSION: No acute intracranial abnormality. Stable chronic right frontal infarct with associated encephalomalacia   Electronically Signed   By: Jerene Dilling M.D.    On: 10/13/2012 14:47   Mr Brain Wo Contrast  10/13/2012   CLINICAL DATA:  New left-sided weakness.  EXAM: MRI HEAD WITHOUT CONTRAST  TECHNIQUE: Multiplanar, multisequence MR imaging was performed. No intravenous contrast was administered.  COMPARISON:  10/13/2012 CT. 05/02/2012 MR.  FINDINGS: Exam is motion degraded. Patient was not able to complete all sequences.  Acute nonhemorrhagic left  thalamic infarct.  Remote large right hemispheric infarct with encephalomalacia once again noted.  Global atrophy without hydrocephalus.  Small vessel disease type changes.  No obvious mass detected on this motion degraded unenhanced exam.  Occluded right internal carotid artery. This was noted previously.  IMPRESSION: Acute nonhemorrhagic left thalamic infarct.  Please see above.  These results will be called to the ordering clinician or representative by the Radiologist Assistant, and communication documented in the PACS Dashboard.   Electronically Signed   By: Bridgett Larsson M.D.   On: 10/13/2012 18:31        2D Echocardiogram   Study Conclusions  - Left ventricle: The cavity size was normal. Wall thickness was normal. Systolic function was normal. The estimated ejection fraction was in the range of 60% to 65%. Wall motion was normal; there were no regional wall motion abnormalities. Doppler parameters are consistent with a reversible restrictive pattern, indicative of decreased left ventricular diastolic compliance and/or increased left atrial pressure (grade 3 diastolic dysfunction). Doppler parameters are consistent with elevated ventricular end-diastolic filling pressure. - Aortic valve: Moderately calcified annulus. Trileaflet. Trivial regurgitation. - Mitral valve: Calcified annulus. Mildly thickened leaflets . Mild regurgitation. - Left atrium: The atrium was moderately dilated. - Right ventricle: Systolic pressure was increased. - Right atrium: The atrium was mildly dilated. - Atrial septum:  A septal defect cannot be excluded. - Tricuspid valve: Moderate regurgitation. - Pulmonary arteries: Systolic pressure was mildly increased. PA peak pressure: 46mm Hg   Carotid Doppler   Vascular Ultrasound  Carotid Duplex (Doppler) has been completed.  Study was technically limited due to patient's inability to turn neck adequately.  Findings suggest 1-39% internal carotid artery stenosis bilaterally. Unable to detect flow in the right external carotid artery distal to the origin, suggestive of possible occlusion versus technical limitations as specified above. Unable to visualize vertebral arteries due to technical limitations.      CXR  Not ordered  EKG  Sinus bradycardia with 1st degree A-V block with occasional Premature ventricular complexes Left ventricular hypertrophy Junctional ST depression, probably abnormal Therapy Recommendations  CIR recommeded   Physical Exam   Frail, cachectic african american elderly male not in distress.Awake alert. Afebrile. Head is nontraumatic.  Neurological Exam ;  Awake alert dysarthric but can be understood.eye movements left lower face weak. Tongue midline. Spastic left hemiplegia 0/5 LUE and 3/5 LLE strength.Rt side normal strength.   The patient is apraxic with finger nose finger, and heel to shin, dense left HH. The patient was not ambulated.  DTR depressed but symmetric, increased tone on the left arm and leg.   ASSESSMENT Mr. RAUL TORRANCE is a 79 y.o. male presenting with dysphagia, dysarthria and old left HP  Imaging confirms a left thalamic infarct. Infarct felt to be thrombotic  secondary to small vessel disease.  On aspirin 81 mg orally every day and clopidogrel 75 mg orally every day prior to admission. Now on aspirin 81 mg orally every day for secondary stroke prevention. Patient with resultant dysphgia, dysarthria. Work up underway.      Hospital day # 2  TREATMENT/PLAN  Continue aspirin 81 mg orally every day for  secondary stroke prevention.    mobilize out of bed,PT/OT/ST consults  D/W daughter  MRA head pending  Rehab consult  Lesly Dukes     10/15/2012 8:52 AM

## 2012-10-15 NOTE — Progress Notes (Addendum)
Subjective: Aaron Johnson is alert but speech is still slurred.  He is on a dysphagia to thin liquid diet as per speech therapy.  Physical therapy recommending inpatient rehabilitation which is quite reasonable.  He has had a left acute left on extra and is on a baby aspirin daily.  Further recommendations for antiplatelet therapy pending from neurology.  Carotid Dopplers revealed less than 40% stenosis bilaterally and 2-D echocardiogram revealed EF of 60-65% with grade 3 diastolic dysfunction  Objective: Weight change:   Intake/Output Summary (Last 24 hours) at 10/15/12 1021 Last data filed at 10/14/12 1200  Gross per 24 hour  Intake      0 ml  Output      0 ml  Net      0 ml   Filed Vitals:   10/14/12 1513 10/14/12 2000 10/15/12 0000 10/15/12 0400  BP: 130/51 132/96 145/69 120/46  Pulse: 64 98 68 78  Temp: 98.1 F (36.7 C) 99.3 F (37.4 C) 97.7 F (36.5 C) 98.8 F (37.1 C)  TempSrc: Oral   Oral  Resp: 18 18 18 16   Height:      Weight:      SpO2: 100% 100% 98% 92%   Physical exam: As per neurology  Lab Results: Results for orders placed during the hospital encounter of 10/13/12 (from the past 48 hour(s))  GLUCOSE, CAPILLARY     Status: None   Collection Time    10/13/12  2:04 PM      Result Value Range   Glucose-Capillary 85  70 - 99 mg/dL   Comment 1 Documented in Chart     Comment 2 Notify RN    URINALYSIS, ROUTINE W REFLEX MICROSCOPIC     Status: None   Collection Time    10/13/12  3:11 PM      Result Value Range   Color, Urine YELLOW  YELLOW   APPearance CLEAR  CLEAR   Specific Gravity, Urine 1.011  1.005 - 1.030   pH 6.0  5.0 - 8.0   Glucose, UA NEGATIVE  NEGATIVE mg/dL   Hgb urine dipstick NEGATIVE  NEGATIVE   Bilirubin Urine NEGATIVE  NEGATIVE   Ketones, ur NEGATIVE  NEGATIVE mg/dL   Protein, ur NEGATIVE  NEGATIVE mg/dL   Urobilinogen, UA 0.2  0.0 - 1.0 mg/dL   Nitrite NEGATIVE  NEGATIVE   Leukocytes, UA NEGATIVE  NEGATIVE   Comment: MICROSCOPIC NOT DONE  ON URINES WITH NEGATIVE PROTEIN, BLOOD, LEUKOCYTES, NITRITE, OR GLUCOSE <1000 mg/dL.  POCT I-STAT TROPONIN I     Status: None   Collection Time    10/13/12  4:20 PM      Result Value Range   Troponin i, poc 0.00  0.00 - 0.08 ng/mL   Comment 3            Comment: Due to the release kinetics of cTnI,     a negative result within the first hours     of the onset of symptoms does not rule out     myocardial infarction with certainty.     If myocardial infarction is still suspected,     repeat the test at appropriate intervals.  PROTIME-INR     Status: None   Collection Time    10/13/12  4:30 PM      Result Value Range   Prothrombin Time 14.4  11.6 - 15.2 seconds   INR 1.14  0.00 - 1.49  APTT     Status: None  Collection Time    10/13/12  4:30 PM      Result Value Range   aPTT 31  24 - 37 seconds  CBC     Status: Abnormal   Collection Time    10/13/12  4:30 PM      Result Value Range   WBC 7.1  4.0 - 10.5 K/uL   RBC 4.37  4.22 - 5.81 MIL/uL   Hemoglobin 12.5 (*) 13.0 - 17.0 g/dL   HCT 69.6 (*) 29.5 - 28.4 %   MCV 87.0  78.0 - 100.0 fL   MCH 28.6  26.0 - 34.0 pg   MCHC 32.9  30.0 - 36.0 g/dL   RDW 13.2  44.0 - 10.2 %   Platelets 76 (*) 150 - 400 K/uL   Comment: REPEATED TO VERIFY     SPECIMEN CHECKED FOR CLOTS     PLATELET COUNT CONFIRMED BY SMEAR  DIFFERENTIAL     Status: None   Collection Time    10/13/12  4:30 PM      Result Value Range   Neutrophils Relative % 54  43 - 77 %   Lymphocytes Relative 33  12 - 46 %   Monocytes Relative 8  3 - 12 %   Eosinophils Relative 4  0 - 5 %   Basophils Relative 1  0 - 1 %   Neutro Abs 3.8  1.7 - 7.7 K/uL   Lymphs Abs 2.3  0.7 - 4.0 K/uL   Monocytes Absolute 0.6  0.1 - 1.0 K/uL   Eosinophils Absolute 0.3  0.0 - 0.7 K/uL   Basophils Absolute 0.1  0.0 - 0.1 K/uL   RBC Morphology ELLIPTOCYTES     Comment: ACANTHOCYTES   Smear Review LARGE PLATELETS PRESENT    COMPREHENSIVE METABOLIC PANEL     Status: Abnormal   Collection Time     10/13/12  4:30 PM      Result Value Range   Sodium 140  135 - 145 mEq/L   Potassium 4.9  3.5 - 5.1 mEq/L   Comment: HEMOLYSIS AT THIS LEVEL MAY AFFECT RESULT   Chloride 106  96 - 112 mEq/L   CO2 22  19 - 32 mEq/L   Glucose, Bld 81  70 - 99 mg/dL   BUN 20  6 - 23 mg/dL   Creatinine, Ser 7.25  0.50 - 1.35 mg/dL   Calcium 9.3  8.4 - 36.6 mg/dL   Total Protein 8.4 (*) 6.0 - 8.3 g/dL   Albumin 4.1  3.5 - 5.2 g/dL   AST 39 (*) 0 - 37 U/L   ALT 23  0 - 53 U/L   Alkaline Phosphatase 80  39 - 117 U/L   Total Bilirubin 0.8  0.3 - 1.2 mg/dL   GFR calc non Af Amer 80 (*) >90 mL/min   GFR calc Af Amer >90  >90 mL/min   Comment: (NOTE)     The eGFR has been calculated using the CKD EPI equation.     This calculation has not been validated in all clinical situations.     eGFR's persistently <90 mL/min signify possible Chronic Kidney     Disease.  TROPONIN I     Status: None   Collection Time    10/13/12  4:30 PM      Result Value Range   Troponin I <0.30  <0.30 ng/mL   Comment:            Due to the release  kinetics of cTnI,     a negative result within the first hours     of the onset of symptoms does not rule out     myocardial infarction with certainty.     If myocardial infarction is still suspected,     repeat the test at appropriate intervals.  CBC     Status: Abnormal   Collection Time    10/13/12  8:04 PM      Result Value Range   WBC 5.9  4.0 - 10.5 K/uL   RBC 3.96 (*) 4.22 - 5.81 MIL/uL   Hemoglobin 11.2 (*) 13.0 - 17.0 g/dL   HCT 78.2 (*) 95.6 - 21.3 %   MCV 84.8  78.0 - 100.0 fL   MCH 28.3  26.0 - 34.0 pg   MCHC 33.3  30.0 - 36.0 g/dL   RDW 08.6  57.8 - 46.9 %   Platelets 89 (*) 150 - 400 K/uL   Comment: CONSISTENT WITH PREVIOUS RESULT     SPECIMEN CHECKED FOR CLOTS  CREATININE, SERUM     Status: Abnormal   Collection Time    10/13/12  8:04 PM      Result Value Range   Creatinine, Ser 0.80  0.50 - 1.35 mg/dL   GFR calc non Af Amer 80 (*) >90 mL/min   GFR calc  Af Amer >90  >90 mL/min   Comment: (NOTE)     The eGFR has been calculated using the CKD EPI equation.     This calculation has not been validated in all clinical situations.     eGFR's persistently <90 mL/min signify possible Chronic Kidney     Disease.  TSH     Status: None   Collection Time    10/13/12  8:04 PM      Result Value Range   TSH 2.861  0.350 - 4.500 uIU/mL   Comment: Performed at [facility]  HEMOGLOBIN A1C     Status: None   Collection Time    10/14/12  9:10 AM      Result Value Range   Hemoglobin A1C 5.2  <5.7 %   Comment: (NOTE)                                                                               According to the ADA Clinical Practice Recommendations for 2011, when     HbA1c is used as a screening test:      >=6.5%   Diagnostic of Diabetes Mellitus               (if abnormal result is confirmed)     5.7-6.4%   Increased risk of developing Diabetes Mellitus     References:Diagnosis and Classification of Diabetes Mellitus,Diabetes     Care,2011,34(Suppl 1):S62-S69 and Standards of Medical Care in             Diabetes - 2011,Diabetes Care,2011,34 (Suppl 1):S11-S61.   Mean Plasma Glucose 103  <117 mg/dL   Comment: Performed at [facility]  LIPID PANEL     Status: None   Collection Time    10/14/12  9:10 AM      Result Value Range  Cholesterol 140  0 - 200 mg/dL   Triglycerides 43  <454 mg/dL   HDL 76  >09 mg/dL   Total CHOL/HDL Ratio 1.8     VLDL 9  0 - 40 mg/dL   LDL Cholesterol 55  0 - 99 mg/dL   Comment:            Total Cholesterol/HDL:CHD Risk     Coronary Heart Disease Risk Table                         Men   Women      1/2 Average Risk   3.4   3.3      Average Risk       5.0   4.4      2 X Average Risk   9.6   7.1      3 X Average Risk  23.4   11.0                Use the calculated Patient Ratio     above and the CHD Risk Table     to determine the patient's CHD Risk.                ATP III CLASSIFICATION (LDL):       <100     mg/dL   Optimal      811-914  mg/dL   Near or Above                        Optimal      130-159  mg/dL   Borderline      782-956  mg/dL   High      >213     mg/dL   Very High  BASIC METABOLIC PANEL     Status: Abnormal   Collection Time    10/14/12  9:10 AM      Result Value Range   Sodium 141  135 - 145 mEq/L   Potassium 3.4 (*) 3.5 - 5.1 mEq/L   Chloride 106  96 - 112 mEq/L   CO2 23  19 - 32 mEq/L   Glucose, Bld 87  70 - 99 mg/dL   BUN 16  6 - 23 mg/dL   Creatinine, Ser 0.86  0.50 - 1.35 mg/dL   Calcium 8.9  8.4 - 57.8 mg/dL   GFR calc non Af Amer 80 (*) >90 mL/min   GFR calc Af Amer >90  >90 mL/min   Comment: (NOTE)     The eGFR has been calculated using the CKD EPI equation.     This calculation has not been validated in all clinical situations.     eGFR's persistently <90 mL/min signify possible Chronic Kidney     Disease.  CBC     Status: Abnormal   Collection Time    10/14/12  9:10 AM      Result Value Range   WBC 7.4  4.0 - 10.5 K/uL   RBC 4.00 (*) 4.22 - 5.81 MIL/uL   Hemoglobin 11.3 (*) 13.0 - 17.0 g/dL   HCT 46.9 (*) 62.9 - 52.8 %   MCV 84.3  78.0 - 100.0 fL   MCH 28.3  26.0 - 34.0 pg   MCHC 33.5  30.0 - 36.0 g/dL   RDW 41.3  24.4 - 01.0 %   Platelets 91 (*) 150 - 400 K/uL   Comment: CONSISTENT WITH PREVIOUS RESULT  Studies/Results: Dg Chest 2 View  10/14/2012   CLINICAL DATA:  79 year old male with CVA. History of aortic valve replacement.  EXAM: CHEST  2 VIEW  COMPARISON:  06/23/2011 and prior chest radiographs dating back to 04/19/2004  FINDINGS: Cardiomegaly and a cardiac valve replacement changes noted.  This is a mildly low volume film.  Fullness of the superior mediastinum is unchanged.  There is no evidence of focal airspace disease, pulmonary edema, suspicious pulmonary nodule/mass, pleural effusion, or pneumothorax. No acute bony abnormalities are identified.  IMPRESSION: No evidence of acute cardiopulmonary disease.  Cardiomegaly and  cardiac valve replacement.   Electronically Signed   By: Laveda Abbe M.D.   On: 10/14/2012 09:50   Ct Head Wo Contrast  10/13/2012   CLINICAL DATA:  Stroke symptoms.  EXAM: CT HEAD WITHOUT CONTRAST  TECHNIQUE: Contiguous axial images were obtained from the base of the skull through the vertex without intravenous contrast.  COMPARISON:  Multiple priors.  FINDINGS: No acute intracranial hemorrhage, mass lesion, brain edema or extra-axial fluid collection is identified. There is stable encephalomalacia in the right frontal lobe with Wallerian degeneration and ipsilateral ventricular dilatation. Cerebral atrophy and chronic small vessel ischemic change. There is no evidence of acute cortical infarct. The visualized paranasal sinuses are clear. The calvarium is intact.  IMPRESSION: No acute intracranial abnormality. Stable chronic right frontal infarct with associated encephalomalacia   Electronically Signed   By: Jerene Dilling M.D.   On: 10/13/2012 14:47   Mr Brain Wo Contrast  10/13/2012   CLINICAL DATA:  New left-sided weakness.  EXAM: MRI HEAD WITHOUT CONTRAST  TECHNIQUE: Multiplanar, multisequence MR imaging was performed. No intravenous contrast was administered.  COMPARISON:  10/13/2012 CT. 05/02/2012 MR.  FINDINGS: Exam is motion degraded. Patient was not able to complete all sequences.  Acute nonhemorrhagic left thalamic infarct.  Remote large right hemispheric infarct with encephalomalacia once again noted.  Global atrophy without hydrocephalus.  Small vessel disease type changes.  No obvious mass detected on this motion degraded unenhanced exam.  Occluded right internal carotid artery. This was noted previously.  IMPRESSION: Acute nonhemorrhagic left thalamic infarct.  Please see above.  These results will be called to the ordering clinician or representative by the Radiologist Assistant, and communication documented in the PACS Dashboard.   Electronically Signed   By: Bridgett Larsson M.D.   On:  10/13/2012 18:31   Medications: Scheduled Meds: . aspirin  81 mg Oral Daily  . atorvastatin  40 mg Oral q1800  . brimonidine  1 drop Both Eyes TID  . clopidogrel  75 mg Oral Daily  . dorzolamide-timolol  1 drop Both Eyes BID  . heparin  5,000 Units Subcutaneous Q8H  . latanoprost  1 drop Both Eyes QHS  . mometasone-formoterol  2 puff Inhalation BID  . sodium chloride  3 mL Intravenous Q12H   Continuous Infusions:  PRN Meds:.acetaminophen, acetaminophen, albuterol, guaiFENesin-dextromethorphan, morphine injection, ondansetron (ZOFRAN) IV, ondansetron, polyethylene glycol  Assessment/Plan:  1.Acute New Left Thalamic Infarct - Antiplatelet therapy of aspirin continued along with Plavix and his home dose statin. 2.Chronic Diastolic heart failure EF of 60-65% with grade 3 diastolic dysfunction. Currently appears compensated.  Will start low-dose losartan 50 milligrams daily 3.Dyslipidemia.  LDL is 55 which is excellent. home statin continued for now.  4.Hypertension stable for now he's not on any schedule and hypertensive medications, we will monitor and adjust medications as needed.  5. History of CAD/aortic valve replacement which is bioprosthetic. No acute issues, He  is symptom free supportive care will be provided  6. COPD. Compensated not short of breath no wheezing. Nebulizer treatments and oxygen as needed.  DVT Prophylaxis Heparin. 7. Dysphagia - dysphagia 2 diet with thin liquids  Family Communication: Patients condition and plan of care including tests being ordered have been discussed with the patient who indicates understanding and agree with the plan and Code Status.  Code Status Full  Disposition: Would likely benefit from inpatient rehabilitation    LOS: 2 days   Pearla Dubonnet, MD 10/15/2012, 10:21 AM

## 2012-10-16 LAB — BASIC METABOLIC PANEL
CO2: 24 mEq/L (ref 19–32)
Chloride: 105 mEq/L (ref 96–112)
Creatinine, Ser: 0.84 mg/dL (ref 0.50–1.35)
Sodium: 139 mEq/L (ref 135–145)

## 2012-10-16 MED ORDER — LOSARTAN POTASSIUM 50 MG PO TABS: 25.0000 mg | ORAL_TABLET | Freq: Every day | ORAL | Status: DC

## 2012-10-16 NOTE — Progress Notes (Signed)
Home health is recommended and is arranged. [phone]

## 2012-10-16 NOTE — Progress Notes (Signed)
Home Health Services arranged with [facility]. [phone]. Physical, Occupational and Speech Therapies.

## 2012-10-16 NOTE — Consult Note (Signed)
Physical Medicine and Rehabilitation Consult Reason for Consult: CVA Referring Physician: Dr. Nehemiah Settle   HPI: Aaron Johnson is a 79 y.o. right-handed male with history of CVA and residual left hemiparesthesias and dysarthria, Hypertension, chronic diastolic congestive heart failure, status post aortic valve replacement. Patient was able to ambulate with a hemiwalker prior to admission. Admitted 10/13/2012 with increasing left-sided weakness and slurred speech. MRI of the brain showed acute nonhemorrhagic left thalamic infarction as well as remote large right hemispheric infarction with encephalomalacia. MRA of the head with occluded right internal carotid artery also seen on prior studies. Echocardiogram with ejection fraction of 65% grade 3 diastolic dysfunction. Carotid Dopplers with no ICA stenosis. Patient did not receive TPA. Neurology services consulted maintained on aspirin and Plavix therapy for CVA prophylaxis. Subcutaneous heparin added for DVT prophylaxis. Currently maintained on a dysphagia to thin liquid diet. Physical occupational therapy evaluations completed with recommendations of physical medicine rehabilitation consult to consider inpatient rehabilitation services   Review of Systems  Eyes:       Blindness left eye  Respiratory: Positive for shortness of breath.   Cardiovascular: Positive for leg swelling.  Gastrointestinal: Positive for constipation.  Neurological: Positive for speech change.  Psychiatric/Behavioral: Positive for memory loss.  All other systems reviewed and are negative.   Past Medical History  Diagnosis Date  . Acquired musculoskeletal deformity of unspecified site   . Chronic diastolic heart failure     EF >= 50%  . HTN (hypertension), benign     systematic  . Pure hypercholesterolemia   . COPD (chronic obstructive pulmonary disease)   . Benign neoplasm of colon   . 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 Iron deficiency anemia, unspecified    . LFTs abnormal 3/06    LFTs wnl, Cr - 1.3 stable. 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 colonoscopy 3/05- adenomatous polyps, mild gastritis-diffuse interosseous skeletal hypertosis (DISH), h/o H. pylori s/p tx.  . Stroke   . Hemiparesis, left   . Aortic valve insufficiency    Past Surgical History  Procedure Laterality Date  . Cardiac catheterization  1/99    mild CAD; EF 35-40%  . Aortic valve replacement      with pericardial tissue valve  . Cardiac valve replacement    . Multiple tooth extractions     Family History  Problem Relation Age of Onset  . Colon cancer Neg Hx   . Heart disease Father    Social History:  reports that he has quit smoking. His smoking use included Cigarettes. He smoked 0.00 packs per day. He has never used smokeless tobacco. He reports that he drinks alcohol. He reports that he does not use illicit drugs. Allergies: No Known Allergies Medications Prior to Admission  Medication Sig Dispense Refill  . aspirin 81 MG tablet Take 81 mg by mouth daily.      Marland Kitchen atorvastatin (LIPITOR) 40 MG tablet Take 40 mg by mouth at bedtime.       . brimonidine (ALPHAGAN P) 0.1 % SOLN Place 1 drop into both eyes 2 (two) times daily.      . clopidogrel (PLAVIX) 75 MG tablet Take 75 mg by mouth daily.      Marland Kitchen docusate sodium (COLACE) 100 MG capsule Take 100 mg by mouth daily.      . dorzolamide-timolol (COSOPT) 22.3-6.8 MG/ML ophthalmic solution Place 1 drop into  both eyes 2 (two) times daily.      . Fluticasone-Salmeterol (ADVAIR DISKUS) 250-50 MCG/DOSE AEPB Inhale 2 puffs into the lungs daily.        . furosemide (LASIX) 20 MG tablet Take 20 mg by mouth daily as needed. For edema      . Multiple Vitamin (MULTIVITAMIN) tablet Take 1 tablet by mouth daily.        Marland Kitchen oxyCODONE-acetaminophen (PERCOCET) 5-325 MG per tablet Take 0.5-1 tablets by mouth every 4 (four)  hours as needed. For pain      . polyethylene glycol powder (MIRALAX) powder Take 17 g by mouth daily as needed. For constipation      . potassium chloride SA (K-DUR,KLOR-CON) 20 MEQ tablet Take 20 mEq by mouth daily as needed. Takes with lasix for edema  30 tablet  12  . Travoprost, BAK Free, (TRAVATAN) 0.004 % SOLN ophthalmic solution Place 1 drop into both eyes at bedtime.        Home: Home Living Family/patient expects to be discharged to:: Private residence Living Arrangements: Children Available Help at Discharge: Family;Available PRN/intermittently Type of Home: House Home Layout: One level Home Equipment:  Psychologist, educational)  Lives With: Daughter  Functional History: Prior Function Vocation: Retired Comments: per patient, transferred independently and ambulated with hemiwalker Functional Status:  Mobility: Bed Mobility Bed Mobility: Supine to Sit Supine to Sit: 3: Mod assist Transfers Transfers: Sit to Stand;Stand to [employer] Transfers Sit to Stand: 3: Mod assist;From bed;From chair/3-in-1;With upper extremity assist Stand to Sit: 3: Mod assist;With upper extremity assist;To chair/3-in-1 Stand Pivot Transfers: 3: Mod assist Ambulation/Gait Ambulation/Gait Assistance: 3: Mod assist Ambulation Distance (Feet): 1 Feet Assistive device: Straight cane Ambulation/Gait Assistance Details: patient unable to advance left LE; unable to reach full upright; attempted just 2-3 steps. Gait Pattern: Step-to pattern;Trunk flexed;Wide base of support    ADL:    Cognition: Cognition Overall Cognitive Status: Impaired/Different from baseline Arousal/Alertness: Awake/alert Orientation Level: Oriented to person;Disoriented to place;Disoriented to time;Disoriented to situation Attention: Sustained Sustained Attention: Impaired Sustained Attention Impairment: Functional basic Memory: Impaired Memory Impairment: Decreased short term memory Decreased Short Term Memory: Verbal  basic;Functional basic Awareness: Impaired Awareness Impairment: Emergent impairment;Anticipatory impairment Problem Solving: Impaired Problem Solving Impairment: Functional basic Safety/Judgment: Impaired Cognition Arousal/Alertness: Awake/alert Behavior During Therapy: WFL for tasks assessed/performed Overall Cognitive Status: Impaired/Different from baseline Area of Impairment: Safety/judgement Safety/Judgement: Decreased awareness of deficits  Blood pressure 138/59, pulse 64, temperature 97.7 F (36.5 C), temperature source Oral, resp. rate 18, height 5\' 6"  (1.676 m), weight 68.176 kg (150 lb 4.8 oz), SpO2 100.00%. Physical Exam  Vitals reviewed. HENT:  Left facial droop  Eyes:  Right pupil reactive to light  Neck: Normal range of motion. Neck supple. No thyromegaly present.  Cardiovascular:  Cardiac rate control  Respiratory:  Decreased breath sounds but clear to auscultation  GI: Soft. Bowel sounds are normal. He exhibits no distension.  Neurological:  Patient was arousable but severely dysarthric. Noted decreased safety awareness and inattention. He did follow some simple commands. Spastic left hemiparesis, 1 to 2/5. LLE is 2/5. RUE and RLE are WNL. Left facial droop and tongue deviation  Skin: Skin is dry.  Psychiatric: He has a normal mood and affect.    No results found for this or any previous visit (from the past 24 hour(s)). Dg Chest 2 View  10/14/2012   CLINICAL DATA:  79 year old male with CVA. History of aortic valve replacement.  EXAM: CHEST  2 VIEW  COMPARISON:  06/23/2011 and prior chest radiographs dating back to 04/19/2004  FINDINGS: Cardiomegaly and a cardiac valve replacement changes noted.  This is a mildly low volume film.  Fullness of the superior mediastinum is unchanged.  There is no evidence of focal airspace disease, pulmonary edema, suspicious pulmonary nodule/mass, pleural effusion, or pneumothorax. No acute bony abnormalities are identified.   IMPRESSION: No evidence of acute cardiopulmonary disease.  Cardiomegaly and cardiac valve replacement.   Electronically Signed   By: Laveda Abbe M.D.   On: 10/14/2012 09:50   Mr Maxine Glenn Head Wo Contrast  10/15/2012   CLINICAL DATA:  Acute left thalamic infarct.  EXAM: MRA HEAD WITHOUT CONTRAST  TECHNIQUE: MRA HEAD WITHOUT CONTRAST  COMPARISON:  10/13/2012 MR brain.  FINDINGS: Exam was attempted twice with sedation and remains significantly motion degraded.  The patient is known to have an occluded right internal carotid artery.  All that can be stated on the present examination is that there is flow within portions of the ; vertebral arteries bilaterally, basilar artery and the left internal carotid artery. Grading stenosis or evaluating for aneurysm is not possible  IMPRESSION: Exam was attempted twice with sedation and remains significantly motion degraded.  The patient is known to have an occluded right internal carotid artery.  All that can be stated on the present examination is that there is flow within portions of the; vertebral arteries bilaterally, basilar artery and the left internal carotid artery.   Electronically Signed   By: Bridgett Larsson M.D.   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 Pt is near baseline at this point other than swallow 1. Does the need for close, 24 hr/day medical supervision in concert with the patient's rehab needs make it unreasonable for this patient to be served in a less intensive setting? No 2. Co-Morbidities requiring supervision/potential complications: CAD, HTN 3. Due to bladder management, bowel management, safety, skin/wound care, disease management, medication administration, pain management and patient education, does the patient require 24 hr/day rehab nursing? No 4. Does the patient require coordinated care of a physician, rehab nurse, PT, OT to address physical and functional  deficits in the context of the above medical diagnosis(es)? No Addressing deficits in the following areas: balance, endurance, locomotion, strength, transferring, bowel/bladder control, bathing, dressing, feeding, grooming, toileting and swallowing 5. Can the patient actively participate in an intensive therapy program of at least 3 hrs of therapy per day at least 5 days per week? Potentially 6. The potential for patient to make measurable gains while on inpatient rehab is fair 7. Anticipated functional outcomes upon discharge from inpatient rehab are n/a with PT, n/a with OT, n/a with SLP. 8. Estimated rehab length of stay to reach the above functional goals is: n/a 9. Does the patient have adequate social supports to accommodate these discharge functional goals? Potentially 10. Anticipated D/C setting: Home 11. Anticipated post D/C treatments: HH therapy 12. Overall Rehab/Functional Prognosis: excellent  RECOMMENDATIONS: This patient's condition is appropriate for continued rehabilitative care in the following setting: [facility] Patient has agreed to participate in recommended program. Potentially Note that insurance prior authorization may be required for reimbursement for recommended care.  Comment: Discussed with daughter who feels, that other than swallow, he's approaching baseline. Has no new sensor-motor deficits on the right related to thalamic infarct. Probably just needs HH vs outpt therapies.   Ranelle Oyster, MD, [facility] Physical Medicine & Rehabilitation  10/16/2012  

## 2012-10-16 NOTE — Progress Notes (Signed)
Stroke Team Progress Note  HISTORY Aaron Johnson is an 79 y.o. male, right handed, with a past medical history significant for HTN, hypercholesterolemia, chronic diastolic CHF, s/p aortic valve replacement, stroke with residual left hemiparesis and dysarthria, blindness left eye, brought to [facility] ED by his daughter 10/13/2012 due to worsening right sided weakness and dysarthria. She said that he is able to ambulate with a hemi walker, but last night 10/12/2012 she noticed that he was not able to use his right side as he usually does and couldn't even drink form a cup with the right hand. She stated that today he has been completely unable to use the right side. In addition, she thinks that his speech is significantly worse and he was drooling a lot earlier today.  [name] denies HA, vertigo, confusion, or falls. No recent fever or infection. On aspirin and plavix daily.Patient was not a TPA candidate secondary to delay in arrival. He was admitted to the neuro  floor for further evaluation and treatment.  SUBJECTIVE Family at the bedside. Dr. Riley Kill from rehab just left - he and family together feel he is almost back to baseline.  OBJECTIVE Most recent Vital Signs: Filed Vitals:   10/15/12 1514 10/15/12 2101 10/16/12 0534 10/16/12 0805  BP: 144/55 124/82 138/59 130/57  Pulse: 64 78 64 67  Temp: 98.1 F (36.7 C) 98.1 F (36.7 C) 97.7 F (36.5 C) 98.2 F (36.8 C)  TempSrc: Oral Oral Oral Oral  Resp: 18 18 18 17   Height:      Weight:      SpO2: 99% 100% 100% 95%   CBG (last 3)   Recent Labs  10/13/12 1404  GLUCAP 85    IV Fluid Intake:      MEDICATIONS  . aspirin  81 mg Oral Daily  . atorvastatin  40 mg Oral q1800  . brimonidine  1 drop Both Eyes TID  . clopidogrel  75 mg Oral Daily  . dorzolamide-timolol  1 drop Both Eyes BID  . heparin  5,000 Units Subcutaneous Q8H  . latanoprost  1 drop Both Eyes QHS  . losartan  50 mg Oral Daily  . mometasone-formoterol  2 puff  Inhalation BID  . sodium chloride  3 mL Intravenous Q12H   PRN:  acetaminophen, acetaminophen, albuterol, guaiFENesin-dextromethorphan, morphine injection, ondansetron (ZOFRAN) IV, ondansetron, polyethylene glycol  Diet:  Dysphagia  2 thin liquids Activity:  OOB with assistance DVT Prophylaxis:  Heparin 5000 units sq tid   CLINICALLY SIGNIFICANT STUDIES Basic Metabolic Panel:   Recent Labs Lab 10/14/12 0910 10/16/12 0525  NA 141 139  K 3.4* 3.2*  CL 106 105  CO2 23 24  GLUCOSE 87 81  BUN 16 13  CREATININE 0.80 0.84  CALCIUM 8.9 8.8   Liver Function Tests:   Recent Labs Lab 10/13/12 1630  AST 39*  ALT 23  ALKPHOS 80  BILITOT 0.8  PROT 8.4*  ALBUMIN 4.1   CBC:   Recent Labs Lab 10/13/12 1630 10/13/12 2004 10/14/12 0910  WBC 7.1 5.9 7.4  NEUTROABS 3.8  --   --   HGB 12.5* 11.2* 11.3*  HCT 38.0* 33.6* 33.7*  MCV 87.0 84.8 84.3  PLT 76* 89* 91*   Coagulation:   Recent Labs Lab 10/13/12 1630  LABPROT 14.4  INR 1.14   Cardiac Enzymes:   Recent Labs Lab 10/13/12 1630  TROPONINI <0.30   Urinalysis:   Recent Labs Lab 10/13/12 1511  COLORURINE YELLOW  LABSPEC 1.011  PHURINE 6.0  GLUCOSEU NEGATIVE  HGBUR NEGATIVE  BILIRUBINUR NEGATIVE  KETONESUR NEGATIVE  PROTEINUR NEGATIVE  UROBILINOGEN 0.2  NITRITE NEGATIVE  LEUKOCYTESUR NEGATIVE   Lipid Panel    Component Value Date/Time   CHOL 140 10/14/2012 0910   TRIG 43 10/14/2012 0910   HDL 76 10/14/2012 0910   CHOLHDL 1.8 10/14/2012 0910   VLDL 9 10/14/2012 0910   LDLCALC 55 10/14/2012 0910   HgbA1C  Lab Results  Component Value Date   HGBA1C 5.2 10/14/2012    Urine Drug Screen:     Component Value Date/Time   LABOPIA NONE DETECTED 05/02/2010 2124   COCAINSCRNUR NONE DETECTED 05/02/2010 2124   LABBENZ NONE DETECTED 05/02/2010 2124   AMPHETMU NONE DETECTED 05/02/2010 2124   THCU NONE DETECTED 05/02/2010 2124   LABBARB  Value: NONE DETECTED        DRUG SCREEN FOR MEDICAL PURPOSES ONLY.  IF  CONFIRMATION IS NEEDED FOR ANY PURPOSE, NOTIFY LAB WITHIN 5 DAYS.        LOWEST DETECTABLE LIMITS FOR URINE DRUG SCREEN Drug Class       Cutoff (ng/mL) Amphetamine      1000 Barbiturate      200 Benzodiazepine   200 Tricyclics       300 Opiates          300 Cocaine          300 THC              50 05/02/2010 2124    Alcohol Level: No results found for this basename: ETH,  in the last 168 hours   CT Head 10/13/2012 No acute intracranial abnormality. Stable chronic right frontal infarct with associated encephalomalacia  MRI of the brain 10/13/2012 Acute nonhemorrhagic left thalamic infarct.  MRA of the Head  10/15/2012  Exam was attempted twice with sedation and remains significantly motion degraded.  The patient is known to have an occluded right internal carotid artery.  All that can be stated on the present examination is that there is flow within portions of the; vertebral arteries bilaterally, basilar artery and the left internal carotid artery.    Carotid Doppler  1-39% internal carotid artery stenosis bilaterally. Unable to detect flow in the right external carotid artery distal to the origin, suggestive of possible occlusion versus technical limitations as specified above. Unable to visualize vertebral arteries due to technical limitations.     2D Echocardiogram  EF 60-65% with no source of embolus.   Chest X Ray 10/14/2012    No evidence of acute cardiopulmonary disease.  Cardiomegaly and cardiac valve replacement.     EKG  Sinus bradycardia with 1st degree A-V block with occasional Premature ventricular complexes. Left ventricular hypertrophy. Junctional ST depression, probably abnormal  Therapy Recommendations  CIR recommeded  Physical Exam   Frail, cachectic african [place] elderly male not in distress.Awake alert. Afebrile. Head is nontraumatic.  Neurological Exam ;  Awake alert dysarthric but can be understood.eye movements good. Decrease vision left eye old. left lower face weak.  Tongue midline. Spastic left hemiplegia 0/5 LUE and 3/5 LLE strength.Rt side normal strength.  The patient is apraxic with finger nose finger, and heel to shin, dense left HH. The patient was not ambulated. DTR depressed but symmetric, increased tone on the left arm and leg.   ASSESSMENT Aaron Johnson is a 79 y.o. male presenting with worsening of left sided weakness, dysphagia, and dysarthria in the setting of old left HP.  Imaging confirms  a new left thalamic infarct, which would not cause left hemiparesis; pt has no right hemiparesis. Infarct felt to be thrombotic secondary to small vessel disease.  On aspirin 81 mg orally every day and clopidogrel 75 mg orally every day prior to admission. Now on aspirin 81 mg orally every day and clopidogrel 75 mg orally every day for secondary stroke prevention. Patient with resultant dysphgia, dysarthria, left hemiparesis is back to baseline.  Work up completed.   Hypertension  Hyperlipidemia, LDL 55, on lipitor 40 PTA, now on lipitor 40, at goal LDL < 100   Chronic diastolic CHF Hx AVR Hx stroke with residual left hemiparesis and dysarthria Left eye blindness  Hospital day # 3  TREATMENT/PLAN  Continue aspirin 81 mg orally every day and clopidogrel 75 mg orally every day for secondary stroke prevention.  Per Rehab consult, essentially back to baseline. No indication for IP rehab. Ok for discharge today per them. Can do home health therapy as indicated - ST. No further stroke workup indicated. Patient has a 10-15% risk of having another stroke over the next year, the highest risk is within 2 weeks of the most recent stroke/TIA (risk of having a stroke following a stroke or TIA is the same). Ongoing risk factor control by Primary Care Physician Stroke Service will sign off. Please call should any needs arise. Follow up with Dr. Pearlean Brownie, Stroke Clinic, in 2 months.  Annie Main, MSN, RN, ANVP-BC, ANP-BC, [facility] Pager: [phone] 10/16/2012 9:24 AM  I have personally obtained a history, examined the patient, evaluated imaging results, and formulated the assessment and plan of care. I agree with the above.   Delia Heady, MD

## 2012-10-16 NOTE — Progress Notes (Signed)
Occupational Therapy Evaluation Patient Details Name: RAYON MCCHRISTIAN MRN: [id number] DOB: August 03, 1926 Today's Date: 10/16/2012 Time: 4696-2952 OT Time Calculation (min): 15 min  OT Assessment / Plan / Recommendation History of present illness Pt. 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 He was brought to the ER for possible stroke.  MRI revealed Acute nonhemorrhagic left thalamic infarct   Clinical Impression   Feel pt is at baseline regarding ADL and mobility for ADL. Daughter assists pt at home. Pt has all equipment needed. No further OT indicated at this time.    OT Assessment  Patient does not need any further OT services    Follow Up Recommendations  No OT follow up    Barriers to Discharge      Equipment Recommendations  None recommended by OT    Recommendations for Other Services    Frequency    eval only   Precautions / Restrictions Precautions Precautions: Fall Type of Shoulder Precautions: patient with hemiplegia left side  Precaution Comments: blind L eye   Pertinent Vitals/Pain no apparent distress     ADL  Eating/Feeding: Supervision/safety;Set up Grooming: Moderate assistance Where Assessed - Grooming: Supported sitting Upper Body Bathing: Moderate assistance Where Assessed - Upper Body Bathing: Supported sitting Lower Body Bathing: Maximal assistance Where Assessed - Lower Body Bathing: Supported sit to stand Upper Body Dressing: Maximal assistance Where Assessed - Upper Body Dressing: Unsupported sitting Lower Body  Dressing: Maximal assistance Where Assessed - Lower Body Dressing: Supported sit to Pharmacist, hospital: Minimal assistance Toilet Transfer Method: Sit to stand Transfers/Ambulation Related to ADLs: minA ADL Comments: Pt uses compensatory pattern for movement    OT Diagnosis:    OT Problem List:   OT Treatment Interventions:     OT Goals(Current goals can be found in the care plan section) Acute Rehab OT Goals Patient Stated Goal: go back home  Visit Information  Last OT Received On: 10/16/12 Assistance Needed: +1 History of Present Illness: Pt. 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 He was brought to the ER for possible stroke.  MRI revealed Acute nonhemorrhagic left thalamic infarct       Prior Functioning     Home Living Family/patient expects to be discharged to:: Private residence Living Arrangements: Children Available Help at Discharge: Family;Available PRN/intermittently Type of Home: House Home Layout: One level  Lives With: Daughter Prior Function Level of Independence: Needs assistance Gait / Transfers Assistance Needed: ambulated with hemiwalker/quad cane - unsure which ADL's / Homemaking Assistance Needed: family assisted with ADL Communication Communication: Expressive difficulties;Other (comment) (dysarthric) Dominant Hand: Right         Vision/Perception Vision -  History Baseline Vision: Legally blind (L eye)   Cognition  Cognition Arousal/Alertness: Awake/alert Behavior During Therapy: WFL for tasks  assessed/performed Overall Cognitive Status: History of cognitive impairments - at baseline    Extremity/Trunk Assessment Upper Extremity Assessment Upper Extremity Assessment: LUE deficits/detail (flecor tone/synergy) LUE:  (L hemiparetic arm - non functional) LUE Coordination: decreased fine motor;decreased gross motor Lower Extremity Assessment Lower Extremity Assessment:  (at baseline) Cervical / Trunk Assessment Cervical / Trunk Assessment: Kyphotic;Other exceptions (post levic)     Mobility Transfers Transfers: Sit to Stand;Stand to Sit Sit to Stand: From chair/3-in-1;4: Min assist Stand to Sit: 4: Min assist;To chair/3-in-1 (Pt with decrased control for sitting)     Exercise     Balance  min A   End of Session OT - End of Session Activity Tolerance: Patient tolerated treatment well Patient left: in chair;with call bell/phone within reach Nurse Communication: Mobility status  GO     Waleed Dettman,HILLARY 10/16/2012, 11:54 AM Luisa Dago, OTR/L  [phone] 10/16/2012

## 2012-10-16 NOTE — Care Management Note (Signed)
    Page 1 of 1   10/16/2012     11:55:17 AM   CARE MANAGEMENT NOTE 10/16/2012  Patient:  Aaron Johnson, Aaron Johnson   Account Number:  [id number]  Date Initiated:  10/16/2012  Documentation initiated by:  GRAVES-BIGELOW,Alexandru Moorer  Subjective/Objective Assessment:   Pt admitted for left-sided hemiparesis and facial weakness. He is from home with daughter. DME in the home WC, and HB. Pt can purchase toilet riser at the [facility] on [address].     Action/Plan:   Daughter chose [facility] for services and CM did make referral for services. SOC to begin within 24-48 hours post d/c. No further needs from CM at this time.   Anticipated DC Date:  10/16/2012   Anticipated DC Plan:  HOME W HOME HEALTH SERVICES      DC Planning Services  CM consult      [facility] Choice  HOME HEALTH   Choice offered to / List presented to:  C-4 Adult Children        HH arranged  HH-2 PT  HH-3 OT  HH-5 SPEECH THERAPY      HH agency  [facility]   Status of service:  Completed, signed off Medicare Important Message given?   (If response is "NO", the following Medicare IM given date fields will be blank) Date Medicare IM given:   Date Additional Medicare IM given:    Discharge Disposition:  HOME W HOME HEALTH SERVICES  Per UR Regulation:  Reviewed for med. necessity/level of care/duration of stay  If discussed at Long Length of Stay Meetings, dates discussed:    Comments:

## 2012-10-16 NOTE — Discharge Summary (Signed)
Physician Discharge Summary  Patient ID: Aaron Johnson MRN: [id number] DOB/AGE: 1926/11/29 79 y.o.  Admit date: 10/13/2012 Discharge date: 10/16/2012  Admission Diagnoses:  Discharge Diagnoses:  Principal Problem:   Left hemiparesis Active Problems:   HYPERLIPIDEMIA   ANEMIA, IRON DEFICIENCY, UNSPEC.   HYPERTENSION, BENIGN SYSTEMIC   CORONARY, ARTERIOSCLEROSIS   CHF, EJECTION FRACTION > OR = 50%   CEREBROVAS DIS, LATE EFFECTS (S/P CVA)   AORTIC VALVE REPLACEMENT, HX OF   Discharged Condition: stable  Hospital Course:  Patient was brought because of concerns for new neuro deficits. Facial droop, drooling and right-sided weakness. In the ER patient was evaluated, and this was deemed necessary for further evaluation and treatment. Initial CT of the hip is negative for acute changes. Please see dictated H&P for further details. Patient had several studies, MRI revealed new acute left thalamic infarct. Patient was seen in consultation by neurology as well as inpatient rehabilitation physician. Initially there was some concern of right side weakness however that improved at the time of my exam he was at his baseline. Patient does have some dysphasia, he's on a dysphagia 2 diet with thin liquids. Patient has improved to the point that inpatient rehabilitation is no longer recommended. Case has been discussed with family and neurology in detail. Current recommendation is for discharge home one aspirin and Plavix. Low dose will start and also has been added as his blood pressure has been borderline. In the past his blood pressure has been low, we will reevaluate if he continues to require this medication on an outpatient basis.  Consults: Treatment Team:  Katy Apo, MD Md Stroke, MD  Significant Diagnostic Studies:Dg Chest 2 View  10/14/2012   CLINICAL DATA:  79 year old male with CVA. History of aortic valve replacement.  EXAM: CHEST  2 VIEW  COMPARISON:  06/23/2011 and prior chest  radiographs dating back to 04/19/2004  FINDINGS: Cardiomegaly and a cardiac valve replacement changes noted.  This is a mildly low volume film.  Fullness of the superior mediastinum is unchanged.  There is no evidence of focal airspace disease, pulmonary edema, suspicious pulmonary nodule/mass, pleural effusion, or pneumothorax. No acute bony abnormalities are identified.  IMPRESSION: No evidence of acute cardiopulmonary disease.  Cardiomegaly and cardiac valve replacement.   Electronically Signed   By: Laveda Abbe M.D.   On: 10/14/2012 09:50   Ct Head Wo Contrast  10/13/2012   CLINICAL DATA:  Stroke symptoms.  EXAM: CT HEAD WITHOUT CONTRAST  TECHNIQUE: Contiguous axial images were obtained from the base of the skull through the vertex without intravenous contrast.  COMPARISON:  Multiple priors.  FINDINGS: No acute intracranial hemorrhage, mass lesion, brain edema or extra-axial fluid collection is identified. There is stable encephalomalacia in the right frontal lobe with Wallerian degeneration and ipsilateral ventricular dilatation. Cerebral atrophy and chronic small vessel ischemic change. There is no evidence of acute cortical infarct. The visualized paranasal sinuses are clear. The calvarium is intact.  IMPRESSION: No acute intracranial abnormality. Stable chronic right frontal infarct with associated encephalomalacia   Electronically Signed   By: Jerene Dilling M.D.   On: 10/13/2012 14:47   Mr Maxine Glenn Head Wo Contrast  10/15/2012   CLINICAL DATA:  Acute left thalamic infarct.  EXAM: MRA HEAD WITHOUT CONTRAST  TECHNIQUE: MRA HEAD WITHOUT CONTRAST  COMPARISON:  10/13/2012 MR brain.  FINDINGS: Exam was attempted twice with sedation and remains significantly motion degraded.  The patient is known to have an occluded right internal carotid artery.  All that can be stated on the present examination is that there is flow within portions of the ; vertebral arteries bilaterally, basilar artery and the left  internal carotid artery. Grading stenosis or evaluating for aneurysm is not possible  IMPRESSION: Exam was attempted twice with sedation and remains significantly motion degraded.  The patient is known to have an occluded right internal carotid artery.  All that can be stated on the present examination is that there is flow within portions of the; vertebral arteries bilaterally, basilar artery and the left internal carotid artery.   Electronically Signed   By: Bridgett Larsson M.D.   On: 10/15/2012 17:51   Mr Brain Wo Contrast  10/13/2012   CLINICAL DATA:  New left-sided weakness.  EXAM: MRI HEAD WITHOUT CONTRAST  TECHNIQUE: Multiplanar, multisequence MR imaging was performed. No intravenous contrast was administered.  COMPARISON:  10/13/2012 CT. 05/02/2012 MR.  FINDINGS: Exam is motion degraded. Patient was not able to complete all sequences.  Acute nonhemorrhagic left thalamic infarct.  Remote large right hemispheric infarct with encephalomalacia once again noted.  Global atrophy without hydrocephalus.  Small vessel disease type changes.  No obvious mass detected on this motion degraded unenhanced exam.  Occluded right internal carotid artery. This was noted previously.  IMPRESSION: Acute nonhemorrhagic left thalamic infarct.  Please see above.  These results will be called to the ordering clinician or representative by the Radiologist Assistant, and communication documented in the PACS Dashboard.   Electronically Signed   By: Bridgett Larsson M.D.   On: 10/13/2012 18:31    CT Head 10/13/2012 No acute intracranial abnormality. Stable chronic right frontal infarct with associated encephalomalacia  MRI of the brain 10/13/2012 Acute nonhemorrhagic left thalamic infarct.  MRA of the Head 10/15/2012 Exam was attempted twice with sedation and remains significantly motion degraded. The patient is known to have an occluded right internal carotid artery. All that can be stated on the present examination is that there is  flow within portions of the; vertebral arteries bilaterally, basilar artery and the left internal carotid artery.  Carotid Doppler 1-39% internal carotid artery stenosis bilaterally. Unable to detect flow in the right external carotid artery distal to the origin, suggestive of possible occlusion versus technical limitations as specified above. Unable to visualize vertebral arteries due to technical limitations.  2D Echocardiogram EF 60-65% with no source of embolus.  Chest X Ray 10/14/2012 No evidence of acute cardiopulmonary disease. Cardiomegaly and cardiac valve replacement.  EKG Sinus bradycardia with 1st degree A-V block with occasional Premature ventricular complexes. Left ventricular hypertrophy. Junctional ST depression, probably abnormal     Discharge Exam: Blood pressure 130/57, pulse 67, temperature 98.2 F (36.8 C), temperature source Oral, resp. rate 17, height 5\' 6"  (1.676 m), weight 68.176 kg (150 lb 4.8 oz), SpO2 95.00%. General appearance: alert and cooperative Resp: clear to auscultation bilaterally Cardio: regular rate and rhythm, S1, S2 normal, no murmur, click, rub or gallop Neurologic: Motor: dense left hemiparesis. Moves right side at baseline  Disposition: 01-Home or Self Care     Medication List         ADVAIR DISKUS 250-50 MCG/DOSE Aepb  Generic drug:  Fluticasone-Salmeterol  Inhale 2 puffs into the lungs daily.     aspirin 81 MG tablet  Take 81 mg by mouth daily.     atorvastatin 40 MG tablet  Commonly known as:  LIPITOR  Take 40 mg by mouth at bedtime.     brimonidine 0.1 % Soln  Commonly  known as:  ALPHAGAN P  Place 1 drop into both eyes 2 (two) times daily.     clopidogrel 75 MG tablet  Commonly known as:  PLAVIX  Take 75 mg by mouth daily.     docusate sodium 100 MG capsule  Commonly known as:  COLACE  Take 100 mg by mouth daily.     dorzolamide-timolol 22.3-6.8 MG/ML ophthalmic solution  Commonly known as:  COSOPT  Place 1 drop into  both eyes 2 (two) times daily.     furosemide 20 MG tablet  Commonly known as:  LASIX  Take 20 mg by mouth daily as needed. For edema     losartan 50 MG tablet  Commonly known as:  COZAAR  Take 0.5 tablets (25 mg total) by mouth daily.     MIRALAX powder  Generic drug:  polyethylene glycol powder  Take 17 g by mouth daily as needed. For constipation     multivitamin tablet  Take 1 tablet by mouth daily.     oxyCODONE-acetaminophen 5-325 MG per tablet  Commonly known as:  PERCOCET/ROXICET  Take 0.5-1 tablets by mouth every 4 (four) hours as needed. For pain     potassium chloride SA 20 MEQ tablet  Commonly known as:  K-DUR,KLOR-CON  Take 20 mEq by mouth daily as needed. Takes with lasix for edema     Travoprost (BAK Free) 0.004 % Soln ophthalmic solution  Commonly known as:  TRAVATAN  Place 1 drop into both eyes at bedtime.           Follow-up Information   Follow up with Gates Rigg, MD In 2 months. (stroke clinic)    Specialties:  Neurology, Radiology   Contact information:   [address] [place] [address] [phone]       Follow up with Katy Apo, MD. Call in 1 week.   Specialty:  Internal Medicine   Contact information:   [address] [place] [address] [phone]      35 minutes spent in the discharge process of this patient. Discussing details with family and with consultants Signed: Gweneth Fredlund D 10/16/2012, 10:15 AM

## 2012-10-16 NOTE — Progress Notes (Signed)
Pt discharged to private vehicle with daughter going home.  Pt stable. Daughter knows of follow up appointments and information on advanced home care and to go to the store for some of her requested equipment. Questions were answered.

## 2012-10-16 NOTE — Progress Notes (Signed)
Physical Therapy Treatment Patient Details Name: Aaron Johnson MRN: [id number] DOB: 27-Mar-1926 Today's Date: 10/16/2012 Time: 4098-1191 PT Time Calculation (min): 22 min  PT Assessment / Plan / Recommendation  History of Present Illness Pt. 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 He was brought to the ER for possible stroke.  MRI revealed Acute nonhemorrhagic left thalamic infarct   PT Comments   Pt appeared to have increased difficulty performing functional mobility with PT than previously today with OT. Pt unable to attempt ambulation with hemiwalker at this time, and would not have been safe to do so without a second person present for pt support and close chair follow. He was able to participate with some therapeutic exercise, with AAROM needed for L side. Per chart review, HHPT has been set up for pt at this time.  Follow Up Recommendations  CIR     Does the patient have the potential to tolerate intense rehabilitation     Barriers to Discharge        Equipment Recommendations  None recommended by PT    Recommendations for Other Services Rehab consult  Frequency Min 4X/week   Progress towards PT Goals Progress towards PT goals: Progressing toward goals  Plan Current plan remains appropriate    Precautions / Restrictions Precautions Precautions: Fall Type of Shoulder Precautions: patient with hemiplegia left side  Precaution Comments: blind L eye   Pertinent Vitals/Pain Pt reports no pain at this time.    Mobility   Transfers Transfers: Sit to Stand;Stand to Sit Sit to Stand: 3: Mod assist;From chair/3-in-1 Stand to Sit: 3: Mod assist;To chair/3-in-1 Details for Transfer Assistance: Pt with need for increased assistance for transfers. Heavy mod assist and hemiwalker for pt to come almost to full stand. He was cued for improved posture but made no corrective changes.  Ambulation/Gait Ambulation/Gait Assistance: Not tested (comment) (Unable at this time) Ambulation/Gait Assistance Details: Due to assistance needed for pt to keep balance and remain upright, ambulation not attempted at this time for safety as +2 is not available. Stairs: No Modified Rankin (Stroke Patients Only) Pre-Morbid Rankin Score: Moderate disability Modified Rankin: Severe disability    Exercises General Exercises - Lower Extremity Long Arc Quad: 10 reps;Right;AROM;Left;AAROM Hip ABduction/ADduction: 10 reps;Right;AROM;Left;AAROM Hip Flexion/Marching: 10 reps;Seated;Right;AROM;Left;AAROM   PT Diagnosis:    PT Problem List:   PT Treatment Interventions:     PT Goals (current goals can now be found in the care plan section) Acute Rehab PT Goals Patient Stated Goal: go back home PT Goal Formulation: With patient Time For Goal Achievement: 10/28/12 Potential to Achieve Goals: Good  Visit Information  Last PT Received On: 10/16/12 Assistance Needed: +1 History of Present Illness: Pt. with history of previous CVA causing dense left-sided hemiparesis and facial weakness, blind in left eye, chronic Diastolic heart failure with EF of 50%, CAD , aortic valve replacement With bioprosthetic valve,Hypertension, dyslipidemia COPD who lives with his family was brought in by family members after they've noticed that he was having more Droop in his face at his baseline and there was  some drooling from the end of his mouth this started sometime last evening about 24 hours ago. He was brought to the ER for possible stroke.  MRI revealed Acute  nonhemorrhagic left thalamic infarct    Subjective Data  Subjective: "Hand me my spoon." Patient Stated Goal: go back home   Cognition  Cognition Arousal/Alertness: Awake/alert Behavior During Therapy: WFL for tasks assessed/performed Overall Cognitive Status: History of cognitive impairments - at baseline Area of Impairment: Safety/judgement Safety/Judgement: Decreased awareness of deficits    Balance  Balance Balance Assessed: Yes Static Standing Balance Static Standing - Balance Support: Right upper extremity supported Static Standing - Level of Assistance: 3: Mod assist Static Standing - Comment/# of Minutes: 3  End of Session PT - End of Session Equipment Utilized During Treatment: Gait belt Activity Tolerance: Patient tolerated treatment well Patient left: in chair;with call bell/phone within reach Nurse Communication: Other (comment) (Pt set up for lunch, needs assist to self-feed)   GP     Ruthann Cancer 10/16/2012, 12:52 PM  Ruthann Cancer, PT, DPT Acute Rehabilitation Services [phone]

## 2012-10-17 NOTE — Progress Notes (Signed)
Quick Note:  Please review the Carotid US, which is noted in your DC summary. ______

## 2012-10-30 ENCOUNTER — Ambulatory Visit (INDEPENDENT_AMBULATORY_CARE_PROVIDER_SITE_OTHER): Payer: Medicare Other | Admitting: Podiatry

## 2012-10-30 ENCOUNTER — Encounter: Payer: Self-pay | Admitting: Podiatry

## 2012-10-30 VITALS — BP 98/65 | HR 66 | Resp 12 | Ht 66.0 in | Wt 180.0 lb

## 2012-10-30 DIAGNOSIS — B351 Tinea unguium: Secondary | ICD-10-CM

## 2012-10-30 DIAGNOSIS — M79609 Pain in unspecified limb: Secondary | ICD-10-CM

## 2012-10-30 NOTE — Progress Notes (Signed)
  Subjective:    Patient ID: Aaron Johnson, male    DOB: [birth year], 79 y.o.   MRN: [id number]  HPI Comments: '' THE RT. BIG TOES IS HURTING AND TOENAIL IS SORE WHEN PUTTING PRESSURE''  Toe Pain  The incident occurred more than 1 week ago. The incident occurred at home. There was no injury mechanism. The pain is present in the right toes. The quality of the pain is described as aching. The pain is at a severity of 9/10. The pain is severe. The pain has been constant since onset. Associated symptoms include an inability to bear weight. It is unknown if a foreign body is present. The symptoms are aggravated by weight bearing. The treatment provided no relief.      Review of Systems  Constitutional: Negative.   HENT: Positive for hearing loss.   Eyes: Positive for visual disturbance.  Respiratory: Negative.   Cardiovascular: Positive for leg swelling.  Endocrine: Negative.   Genitourinary: Positive for frequency.  Musculoskeletal: Negative.   Skin: Negative.   Allergic/Immunologic: Negative.   Neurological: Negative.   Hematological: Negative.   Psychiatric/Behavioral: Negative.        Objective:   Physical Exam  Subjective: He 27-year-old black male presents with his daughter. He appears to be orientated x3.  Vascular: The DP and PT pulses are trace palpable bilaterally. Capillary fill is immediate bilaterally.  Dermatological: The right hallux toenail is hypertrophic deformed and tender to pressure. A small keratoses noted on the plantar right hallux noted.  Neurological: Left hemiparesis present.        Assessment & Plan:    Assessment: Symptomatic mycotic right hallux nail.  Plan: The right hallux nail was debrided and packed with antibiotic ointment. Patient advised to continue applying antibiotic ointment to the left hallux nail until the pain stops. He returns his symptoms do not improve.  Richard C.Leeanne Deed, DPM

## 2012-10-30 NOTE — Patient Instructions (Signed)
Apply triple antibiotic ointment on a daily basis to the right great toenail and cover with Band-Aid until pain ends.

## 2012-12-18 ENCOUNTER — Encounter: Payer: Self-pay | Admitting: Nurse Practitioner

## 2012-12-18 ENCOUNTER — Ambulatory Visit (INDEPENDENT_AMBULATORY_CARE_PROVIDER_SITE_OTHER): Payer: Medicare Other | Admitting: Nurse Practitioner

## 2012-12-18 VITALS — BP 125/62 | HR 61 | Temp 98.0°F | Ht 63.25 in | Wt 148.0 lb

## 2012-12-18 DIAGNOSIS — I6350 Cerebral infarction due to unspecified occlusion or stenosis of unspecified cerebral artery: Secondary | ICD-10-CM

## 2012-12-18 NOTE — Patient Instructions (Signed)
Continue aspirin 81 mg orally every day and clopidogrel 75 mg orally every day  for secondary stroke prevention and maintain strict control of hypertension with blood pressure goal below 130/90, diabetes with hemoglobin A1c goal below 6.5% and lipids with LDL cholesterol goal below 100 mg/dL.  Followup in the future with me in 6 months.

## 2012-12-18 NOTE — Progress Notes (Signed)
PATIENT: Aaron Johnson DOB: 1926/03/23   REASON FOR VISIT: follow up for stroke HISTORY FROM: patient  HISTORY OF PRESENT ILLNESS: Aaron Johnson is an 79 y.o. 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 She said that he is able to ambulate with a hemi walker, but last night 10/12/2012 she noticed that he was not able to use his right side as he usually does and couldn't even drink form a cup with the right hand. She stated that today he has been completely unable to use the right side. In addition, she thinks that his speech is significantly worse and he was drooling a lot earlier today.  Aaron Johnson denied HA, vertigo, confusion, or falls. No recent fever or infection. On aspirin and plavix daily. Patient was not a TPA candidate secondary to delay in arrival. He was admitted to the neuro floor for further evaluation and treatment.  MRI of the brain showed an acute nonhemorrhagic left thalamic infarct. MRA of the Head was significantly motion degraded. The patient is known to have an occluded right internal carotid artery. All that can be stated on the present examination is that there is flow within portions of the; vertebral arteries bilaterally, basilar artery and the left internal carotid artery.  Carotid Doppler showed 1-39% internal carotid artery stenosis bilaterally. 2D Echocardiogram with EF 60-65% with no source of embolus.  He has completed home PT and ST.  He is essentially back to his baseline.  He is taking aspirin and Plavix with no signs of bleeding or significant bruising.  BP is well controlled, is 125/62 in office today.  He lives with his daughter and is able to ambulate with a hemi-walker for short  distances.  REVIEW OF SYSTEMS: Full 14 system review of systems performed and notable only for: (nothing).  ALLERGIES: No Known Allergies  HOME MEDICATIONS: Outpatient Prescriptions Prior to Visit  Medication Sig Dispense Refill  . aspirin 81 MG tablet Take 81 mg by mouth daily.      Marland Kitchen atorvastatin (LIPITOR) 40 MG tablet Take 40 mg by mouth at bedtime.       . brimonidine (ALPHAGAN P) 0.1 % SOLN Place 1 drop into both eyes 2 (two) times daily.      . clopidogrel (PLAVIX) 75 MG tablet Take 75 mg by mouth daily.      Marland Kitchen docusate sodium (COLACE) 100 MG capsule Take 100 mg by mouth daily.      . dorzolamide-timolol (COSOPT) 22.3-6.8 MG/ML ophthalmic solution Place 1 drop into both eyes 2 (two) times daily.      . Fluticasone-Salmeterol (ADVAIR DISKUS) 250-50 MCG/DOSE AEPB Inhale 2 puffs into the lungs daily.        . furosemide (LASIX) 20 MG tablet Take 20 mg by mouth daily as needed. For edema      . Multiple Vitamin (MULTIVITAMIN) tablet Take 1 tablet by mouth daily.        Marland Kitchen oxyCODONE-acetaminophen (PERCOCET) 5-325 MG per tablet Take 0.5-1 tablets by mouth every 4 (four) hours as needed. For pain      . polyethylene glycol powder (MIRALAX) powder Take 17 g by mouth daily as needed. For constipation      . potassium chloride SA (K-DUR,KLOR-CON) 20 MEQ tablet Take  20 mEq by mouth daily as needed. Takes with lasix for edema  30 tablet  12  . Travoprost, BAK Free, (TRAVATAN) 0.004 % SOLN ophthalmic solution Place 1 drop into both eyes at bedtime.      Marland Kitchen losartan (COZAAR) 50 MG tablet Take 0.5 tablets (25 mg total) by mouth daily.       No facility-administered medications prior to visit.    PAST MEDICAL HISTORY: Past Medical History  Diagnosis Date  . Acquired musculoskeletal deformity of unspecified site   . Chronic diastolic heart failure     EF >= 50%  . HTN (hypertension), benign     systematic  . Pure hypercholesterolemia   . COPD (chronic obstructive pulmonary disease)   .  Benign neoplasm of colon   . 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 Iron deficiency anemia, unspecified   . LFTs abnormal 3/06    LFTs wnl, Cr - 1.3 stable. 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 colonoscopy 3/05- adenomatous polyps, mild gastritis-diffuse interosseous skeletal hypertosis (DISH), h/o H. pylori s/p tx.  . Stroke   . Hemiparesis, left   . Aortic valve insufficiency     PAST SURGICAL HISTORY: Past Surgical History  Procedure Laterality Date  . Cardiac catheterization  1/99    mild CAD; EF 35-40%  . Aortic valve replacement      with pericardial tissue valve  . Cardiac valve replacement    . Multiple tooth extractions      FAMILY HISTORY: Family History  Problem Relation Age of Onset  . Colon cancer Neg Hx   . Heart disease Father     SOCIAL HISTORY: History   Social History  . Marital Status: Divorced    Spouse Name: N/A    Number of Children: 4  . Years of Education: 7   Occupational History  . disabled    Social History Main Topics  . Smoking status: Former Smoker    Types: Cigarettes  . Smokeless tobacco: Never Used     Comment: 10/13/2012 "smoked a little bit a long time ago"  . Alcohol Use: Yes     Comment: 10/13/2012 "haven't had anything to drink for years"  . Drug Use: No  . Sexual Activity: No   Other Topics Concern  . Not on file   Social History Narrative   Married to [place]; lives with daughter. Children involved in his life.      PHYSICAL EXAM  Filed Vitals:   12/18/12 1501  BP: 125/62  Pulse: 61  Temp: 98 F (36.7 C)  TempSrc: Oral  Height: 5' 3.25" (1.607 m)  Weight: 148 lb (67.132 kg)   Body mass index is 26 kg/(m^2).  Generalized: Frail, cachectic african american elderly male not in distress Head: normocephalic and atraumatic. Oropharynx benign  Neck: Supple, no carotid bruits  Cardiac:  Regular rate rhythm, 2/6 murmur  Musculoskeletal: No deformity   Neurological examination  Mentation: Alert oriented to time, place, history taking. Follows all commands, dysarthric but can be understood Cranial nerve II-XII: Decrease vision left eye old. Blind in right eye.  left lower face weak. hearing was decreased to finger rubbing bilaterally. Uvula tongue midline. head turning and shoulder shrug decreased on the left.  Motor: Spastic left hemiplegia 1/5 LUE and 3/5 LLE strength. Rt side normal strength.  Sensory: normal and symmetric to light touch, pinprick, and  vibration  Coordination: The patient is apraxic with finger nose finger, and heel to shin Reflexes:  Deep tendon reflexes in the upper and lower extremities are present and trace.  Gait and Station: ambulation was not test; in wheelchair.  mRankin 3, NIHSS 11  DIAGNOSTIC DATA (LABS, IMAGING, TESTING) - I reviewed patient records, labs, notes, testing and imaging myself where available.  Lab Results  Component Value Date   WBC 7.4 10/14/2012   HGB 11.3* 10/14/2012   HCT 33.7* 10/14/2012   MCV 84.3 10/14/2012   PLT 91* 10/14/2012      Component Value Date/Time   NA 139 10/16/2012 0525   K 3.2* 10/16/2012 0525   CL 105 10/16/2012 0525   CO2 24 10/16/2012 0525   GLUCOSE 81 10/16/2012 0525   BUN 13 10/16/2012 0525   CREATININE 0.84 10/16/2012 0525   CALCIUM 8.8 10/16/2012 0525   PROT 8.4* 10/13/2012 1630   ALBUMIN 4.1 10/13/2012 1630   AST 39* 10/13/2012 1630   ALT 23 10/13/2012 1630   ALKPHOS 80 10/13/2012 1630   BILITOT 0.8 10/13/2012 1630   GFRNONAA 78* 10/16/2012 0525   GFRAA >90 10/16/2012 0525   Lab Results  Component Value Date   CHOL 140 10/14/2012   HDL 76 10/14/2012   LDLCALC 55 10/14/2012   TRIG 43 10/14/2012   CHOLHDL 1.8 10/14/2012   Lab Results  Component Value Date   HGBA1C 5.2 10/14/2012   No results found for this basename: [id number]   Lab Results  Component Value Date   TSH  2.861 10/13/2012    ASSESSMENT AND PLAN Aaron Johnson is a 79 y.o. male presenting with worsening of left sided weakness, dysphagia, and dysarthria in the setting of old left HP. Imaging confirms a new left thalamic infarct.  Infarct felt to be thrombotic secondary to small vessel disease. Patient with resultant dysarthria, left hemiparesis is back to baseline.   PLAN: Continue aspirin 81 mg orally every day and clopidogrel 75 mg orally every day  for secondary stroke prevention and maintain strict control of hypertension with blood pressure goal below 130/90,  lipids with LDL cholesterol goal below 100 mg/dL.   Return in about 6 months (around 06/18/2013).  Ronal Fear, MSN, NP-C 12/18/2012, 4:18 PM [facility] [address] [place], [place] [address] [phone]  Note: This document was prepared with digital dictation and possible smart phrase technology. Any transcriptional errors that result from this process are unintentional.

## 2012-12-30 ENCOUNTER — Other Ambulatory Visit (HOSPITAL_COMMUNITY): Payer: Self-pay | Admitting: Cardiology

## 2013-01-09 ENCOUNTER — Other Ambulatory Visit: Payer: Self-pay | Admitting: Internal Medicine

## 2013-01-09 DIAGNOSIS — I6350 Cerebral infarction due to unspecified occlusion or stenosis of unspecified cerebral artery: Secondary | ICD-10-CM

## 2013-01-10 ENCOUNTER — Ambulatory Visit
Admission: RE | Admit: 2013-01-10 | Discharge: 2013-01-10 | Disposition: A | Discharge status: Home / Self Care | Payer: PRIVATE HEALTH INSURANCE | Source: Ambulatory Visit | Attending: Internal Medicine | Admitting: Internal Medicine

## 2013-01-10 DIAGNOSIS — I6350 Cerebral infarction due to unspecified occlusion or stenosis of unspecified cerebral artery: Secondary | ICD-10-CM

## 2013-02-06 ENCOUNTER — Encounter (HOSPITAL_COMMUNITY): Payer: Self-pay | Admitting: Emergency Medicine

## 2013-02-06 ENCOUNTER — Inpatient Hospital Stay (HOSPITAL_COMMUNITY)
Admission: EM | Admit: 2013-02-06 | Discharge: 2013-02-08 | DRG: 194 | Disposition: A | Discharge status: Home / Self Care | Payer: PRIVATE HEALTH INSURANCE | Attending: Internal Medicine | Admitting: Internal Medicine

## 2013-02-06 ENCOUNTER — Emergency Department (HOSPITAL_COMMUNITY): Payer: PRIVATE HEALTH INSURANCE

## 2013-02-06 DIAGNOSIS — M545 Low back pain, unspecified: Secondary | ICD-10-CM | POA: Diagnosis present

## 2013-02-06 DIAGNOSIS — I69922 Dysarthria following unspecified cerebrovascular disease: Secondary | ICD-10-CM

## 2013-02-06 DIAGNOSIS — I69959 Hemiplegia and hemiparesis following unspecified cerebrovascular disease affecting unspecified side: Secondary | ICD-10-CM

## 2013-02-06 DIAGNOSIS — I5032 Chronic diastolic (congestive) heart failure: Secondary | ICD-10-CM

## 2013-02-06 DIAGNOSIS — M959 Acquired deformity of musculoskeletal system, unspecified: Secondary | ICD-10-CM | POA: Diagnosis present

## 2013-02-06 DIAGNOSIS — D509 Iron deficiency anemia, unspecified: Secondary | ICD-10-CM | POA: Diagnosis present

## 2013-02-06 DIAGNOSIS — E7800 Pure hypercholesterolemia, unspecified: Secondary | ICD-10-CM | POA: Diagnosis present

## 2013-02-06 DIAGNOSIS — I10 Essential (primary) hypertension: Secondary | ICD-10-CM | POA: Diagnosis present

## 2013-02-06 DIAGNOSIS — Z7902 Long term (current) use of antithrombotics/antiplatelets: Secondary | ICD-10-CM

## 2013-02-06 DIAGNOSIS — Z7982 Long term (current) use of aspirin: Secondary | ICD-10-CM

## 2013-02-06 DIAGNOSIS — G8190 Hemiplegia, unspecified affecting unspecified side: Secondary | ICD-10-CM

## 2013-02-06 DIAGNOSIS — Z952 Presence of prosthetic heart valve: Secondary | ICD-10-CM

## 2013-02-06 DIAGNOSIS — Z87891 Personal history of nicotine dependence: Secondary | ICD-10-CM

## 2013-02-06 DIAGNOSIS — R509 Fever, unspecified: Secondary | ICD-10-CM | POA: Diagnosis present

## 2013-02-06 DIAGNOSIS — R1310 Dysphagia, unspecified: Secondary | ICD-10-CM | POA: Diagnosis present

## 2013-02-06 DIAGNOSIS — J4489 Other specified chronic obstructive pulmonary disease: Secondary | ICD-10-CM | POA: Diagnosis present

## 2013-02-06 DIAGNOSIS — G8194 Hemiplegia, unspecified affecting left nondominant side: Secondary | ICD-10-CM

## 2013-02-06 DIAGNOSIS — J449 Chronic obstructive pulmonary disease, unspecified: Secondary | ICD-10-CM | POA: Diagnosis present

## 2013-02-06 DIAGNOSIS — G8929 Other chronic pain: Secondary | ICD-10-CM | POA: Diagnosis present

## 2013-02-06 DIAGNOSIS — J189 Pneumonia, unspecified organism: Principal | ICD-10-CM | POA: Diagnosis present

## 2013-02-06 DIAGNOSIS — Z79899 Other long term (current) drug therapy: Secondary | ICD-10-CM

## 2013-02-06 DIAGNOSIS — I509 Heart failure, unspecified: Secondary | ICD-10-CM | POA: Diagnosis present

## 2013-02-06 DIAGNOSIS — I69991 Dysphagia following unspecified cerebrovascular disease: Secondary | ICD-10-CM

## 2013-02-06 HISTORY — DX: Heart failure, unspecified: I50.9

## 2013-02-06 LAB — CBC WITH DIFFERENTIAL/PLATELET
BASOS ABS: 0 10*3/uL (ref 0.0–0.1)
Basophils Absolute: 0 10*3/uL (ref 0.0–0.1)
Basophils Relative: 0 % (ref 0–1)
Basophils Relative: 0 % (ref 0–1)
Eosinophils Absolute: 0.1 10*3/uL (ref 0.0–0.7)
Eosinophils Absolute: 0.1 10*3/uL (ref 0.0–0.7)
Eosinophils Relative: 2 % (ref 0–5)
Eosinophils Relative: 1 % (ref 0–5)
HCT: 36 % — ABNORMAL LOW (ref 39.0–52.0)
HEMATOCRIT: 35.1 % — AB (ref 39.0–52.0)
HEMOGLOBIN: 11.5 g/dL — AB (ref 13.0–17.0)
Hemoglobin: 11.7 g/dL — ABNORMAL LOW (ref 13.0–17.0)
LYMPHS ABS: 1.2 10*3/uL (ref 0.7–4.0)
LYMPHS ABS: 1.9 10*3/uL (ref 0.7–4.0)
LYMPHS PCT: 19 % (ref 12–46)
LYMPHS PCT: 21 % (ref 12–46)
MCH: 28.4 pg (ref 26.0–34.0)
MCH: 28.3 pg (ref 26.0–34.0)
MCHC: 32.5 g/dL (ref 30.0–36.0)
MCHC: 32.8 g/dL (ref 30.0–36.0)
MCV: 87.4 fL (ref 78.0–100.0)
MCV: 86.5 fL (ref 78.0–100.0)
MONOS PCT: 13 % — AB (ref 3–12)
Monocytes Absolute: 0.9 10*3/uL (ref 0.1–1.0)
Monocytes Absolute: 1.2 10*3/uL — ABNORMAL HIGH (ref 0.1–1.0)
Monocytes Relative: 14 % — ABNORMAL HIGH (ref 3–12)
Neutro Abs: 4.3 10*3/uL (ref 1.7–7.7)
Neutro Abs: 5.9 10*3/uL (ref 1.7–7.7)
Neutrophils Relative %: 66 % (ref 43–77)
Neutrophils Relative %: 65 % (ref 43–77)
PLATELETS: 86 10*3/uL — AB (ref 150–400)
Platelets: 79 10*3/uL — ABNORMAL LOW (ref 150–400)
RBC: 4.12 MIL/uL — AB (ref 4.22–5.81)
RBC: 4.06 MIL/uL — ABNORMAL LOW (ref 4.22–5.81)
RDW: 13.6 % (ref 11.5–15.5)
RDW: 13.6 % (ref 11.5–15.5)
WBC: 6.6 10*3/uL (ref 4.0–10.5)
WBC: 9.1 10*3/uL (ref 4.0–10.5)

## 2013-02-06 LAB — BASIC METABOLIC PANEL
BUN: 18 mg/dL (ref 6–23)
BUN: 16 mg/dL (ref 6–23)
CHLORIDE: 104 meq/L (ref 96–112)
CO2: 23 meq/L (ref 19–32)
CO2: 20 meq/L (ref 19–32)
Calcium: 8.7 mg/dL (ref 8.4–10.5)
Calcium: 8.5 mg/dL (ref 8.4–10.5)
Chloride: 105 mEq/L (ref 96–112)
Creatinine, Ser: 0.88 mg/dL (ref 0.50–1.35)
Creatinine, Ser: 0.8 mg/dL (ref 0.50–1.35)
GFR calc Af Amer: 89 mL/min — ABNORMAL LOW (ref >90)
GFR calc Af Amer: >90 mL/min (ref >90)
GFR calc non Af Amer: 80 mL/min — ABNORMAL LOW (ref >90)
GFR, EST NON AFRICAN AMERICAN: 77 mL/min — AB (ref >90)
Glucose, Bld: 103 mg/dL — ABNORMAL HIGH (ref 70–99)
Glucose, Bld: 120 mg/dL — ABNORMAL HIGH (ref 70–99)
Potassium: 3.4 mEq/L — ABNORMAL LOW (ref 3.7–5.3)
Potassium: 3.9 mEq/L (ref 3.7–5.3)
SODIUM: 143 meq/L (ref 137–147)
SODIUM: 139 meq/L (ref 137–147)

## 2013-02-06 LAB — INFLUENZA PANEL BY PCR (TYPE A & B)
H1N1 flu by pcr: NOT DETECTED
INFLAPCR: NEGATIVE
INFLBPCR: NEGATIVE

## 2013-02-06 LAB — LACTIC ACID, PLASMA: LACTIC ACID, VENOUS: 1.4 mmol/L (ref 0.5–2.2)

## 2013-02-06 MED ORDER — SODIUM CHLORIDE 0.9 % IJ SOLN
3.0000 mL | Freq: Two times a day (BID) | INTRAMUSCULAR | Status: DC
Start: 2013-02-06 — End: 2013-02-08
  Administered 2013-02-06 – 2013-02-08 (×5): 3 mL via INTRAVENOUS

## 2013-02-06 MED ORDER — DORZOLAMIDE HCL 2 % OP SOLN
1.0000 [drp] | Freq: Three times a day (TID) | OPHTHALMIC | Status: DC
  Administered 2013-02-06 – 2013-02-08 (×6): 1 [drp] via OPHTHALMIC
  Filled 2013-02-06 (×2): qty 10

## 2013-02-06 MED ORDER — SODIUM CHLORIDE 0.9 % IJ SOLN: 3.0000 mL | INTRAMUSCULAR | Status: DC | PRN

## 2013-02-06 MED ORDER — OSELTAMIVIR PHOSPHATE 75 MG PO CAPS
75.0000 mg | ORAL_CAPSULE | Freq: Two times a day (BID) | ORAL | Status: DC
  Filled 2013-02-06 (×2): qty 1

## 2013-02-06 MED ORDER — ASPIRIN 81 MG PO CHEW
81.0000 mg | CHEWABLE_TABLET | Freq: Every day | ORAL | Status: DC
  Administered 2013-02-06 – 2013-02-08 (×3): 81 mg via ORAL
  Filled 2013-02-06 (×3): qty 1

## 2013-02-06 MED ORDER — MOMETASONE FURO-FORMOTEROL FUM 100-5 MCG/ACT IN AERO
2.0000 | INHALATION_SPRAY | Freq: Two times a day (BID) | RESPIRATORY_TRACT | Status: DC
  Administered 2013-02-06 – 2013-02-08 (×4): 2 via RESPIRATORY_TRACT
  Filled 2013-02-06: qty 8.8

## 2013-02-06 MED ORDER — POTASSIUM CHLORIDE 20 MEQ PO PACK
20.0000 meq | PACK | Freq: Every day | ORAL | Status: DC | PRN
  Filled 2013-02-06: qty 1

## 2013-02-06 MED ORDER — ACETAMINOPHEN 325 MG PO TABS
650.0000 mg | ORAL_TABLET | Freq: Once | ORAL | Status: AC
  Administered 2013-02-06: 650 mg via ORAL
  Filled 2013-02-06: qty 2

## 2013-02-06 MED ORDER — POTASSIUM CHLORIDE CRYS ER 20 MEQ PO TBCR
20.0000 meq | EXTENDED_RELEASE_TABLET | Freq: Every day | ORAL | Status: DC
  Administered 2013-02-06 – 2013-02-08 (×3): 20 meq via ORAL
  Filled 2013-02-06 (×3): qty 1

## 2013-02-06 MED ORDER — SODIUM CHLORIDE 0.9 % IV SOLN: 250.0000 mL | INTRAVENOUS | Status: DC | PRN

## 2013-02-06 MED ORDER — DEXTROSE 5 % IV SOLN
1.0000 g | Freq: Once | INTRAVENOUS | Status: AC
  Administered 2013-02-06: 1 g via INTRAVENOUS
  Filled 2013-02-06: qty 10

## 2013-02-06 MED ORDER — DEXTROSE 5 % IV SOLN
500.0000 mg | INTRAVENOUS | Status: DC
  Administered 2013-02-07 – 2013-02-08 (×2): 500 mg via INTRAVENOUS
  Filled 2013-02-06 (×2): qty 500

## 2013-02-06 MED ORDER — DEXTROSE 5 % IV SOLN
1.0000 g | INTRAVENOUS | Status: DC
  Administered 2013-02-07 – 2013-02-08 (×2): 1 g via INTRAVENOUS
  Filled 2013-02-06 (×2): qty 10

## 2013-02-06 MED ORDER — ACETAMINOPHEN 325 MG PO TABS
650.0000 mg | ORAL_TABLET | Freq: Once | ORAL | Status: AC
  Administered 2013-02-06: 650 mg via ORAL

## 2013-02-06 MED ORDER — CLOPIDOGREL BISULFATE 75 MG PO TABS
75.0000 mg | ORAL_TABLET | Freq: Every day | ORAL | Status: DC
  Administered 2013-02-06 – 2013-02-08 (×3): 75 mg via ORAL
  Filled 2013-02-06 (×3): qty 1

## 2013-02-06 MED ORDER — ATORVASTATIN CALCIUM 40 MG PO TABS
40.0000 mg | ORAL_TABLET | Freq: Every day | ORAL | Status: DC
  Administered 2013-02-06 – 2013-02-07 (×2): 40 mg via ORAL
  Filled 2013-02-06 (×3): qty 1

## 2013-02-06 MED ORDER — IPRATROPIUM-ALBUTEROL 0.5-2.5 (3) MG/3ML IN SOLN
3.0000 mL | RESPIRATORY_TRACT | Status: DC
  Administered 2013-02-06 – 2013-02-08 (×13): 3 mL via RESPIRATORY_TRACT
  Filled 2013-02-06 (×13): qty 3

## 2013-02-06 MED ORDER — SENNOSIDES-DOCUSATE SODIUM 8.6-50 MG PO TABS: 1.0000 | ORAL_TABLET | Freq: Every evening | ORAL | Status: DC | PRN

## 2013-02-06 MED ORDER — LOSARTAN POTASSIUM 25 MG PO TABS
25.0000 mg | ORAL_TABLET | Freq: Every day | ORAL | Status: DC
  Administered 2013-02-06 – 2013-02-08 (×3): 25 mg via ORAL
  Filled 2013-02-06 (×3): qty 1

## 2013-02-06 MED ORDER — AZITHROMYCIN 250 MG PO TABS
500.0000 mg | ORAL_TABLET | Freq: Once | ORAL | Status: AC
  Administered 2013-02-06: 500 mg via ORAL
  Filled 2013-02-06: qty 2

## 2013-02-06 MED ORDER — POTASSIUM CHLORIDE CRYS ER 20 MEQ PO TBCR
40.0000 meq | EXTENDED_RELEASE_TABLET | Freq: Once | ORAL | Status: AC
  Administered 2013-02-06: 40 meq via ORAL
  Filled 2013-02-06: qty 2

## 2013-02-06 MED ORDER — ENOXAPARIN SODIUM 40 MG/0.4ML ~~LOC~~ SOLN
40.0000 mg | SUBCUTANEOUS | Status: DC
  Administered 2013-02-06 – 2013-02-08 (×3): 40 mg via SUBCUTANEOUS
  Filled 2013-02-06 (×3): qty 0.4

## 2013-02-06 MED ORDER — IPRATROPIUM-ALBUTEROL 18-103 MCG/ACT IN AERO: 1.0000 | INHALATION_SPRAY | RESPIRATORY_TRACT | Status: DC

## 2013-02-06 MED ORDER — OXYCODONE-ACETAMINOPHEN 5-325 MG PO TABS: 0.5000 | ORAL_TABLET | ORAL | Status: DC | PRN

## 2013-02-06 NOTE — ED Notes (Signed)
Phlebotomy at BS

## 2013-02-06 NOTE — ED Provider Notes (Addendum)
CSN: [id number]     Arrival date & time 02/06/13  0030 History   First MD Initiated Contact with Patient 02/06/13 0208     Chief Complaint  Patient presents with  . URI  . Influenza  . Fall   (Consider location/radiation/quality/duration/timing/severity/associated sxs/prior Treatment) HPI comPlains of cough, productive for 4 days with fever. Patient fell at home tonight while walking with his walker. He suffered an injury to his left elbow as result of event. No loss of consciousness. No other complaint. He denies elbow pain. No treatment prior to coming here. No nausea or vomiting. Past Medical History  Diagnosis Date  . Acquired musculoskeletal deformity of unspecified site   . Chronic diastolic heart failure     EF >= 50%  . HTN (hypertension), benign     systematic  . Pure hypercholesterolemia   . COPD (chronic obstructive pulmonary disease)   . Benign neoplasm of colon   . 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 Iron deficiency anemia, unspecified   . LFTs abnormal 3/06    LFTs wnl, Cr - 1.3 stable. 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 colonoscopy 3/05- adenomatous polyps, mild gastritis-diffuse interosseous skeletal hypertosis (DISH), h/o H. pylori s/p tx.  . Stroke   . Hemiparesis, left   . Aortic valve insufficiency    Past Surgical History  Procedure Laterality Date  . Cardiac catheterization  1/99    mild CAD; EF 35-40%  . Aortic valve replacement      with pericardial tissue valve  . Cardiac valve replacement    . Multiple tooth extractions     Family History  Problem Relation Age of Onset  . Colon cancer Neg Hx   . Heart disease Father    History  Substance Use Topics  . Smoking status: Former Smoker    Types: Cigarettes  . Smokeless tobacco: Never Used     Comment: 10/13/2012 "smoked a little bit a long time ago"  . Alcohol Use: Yes      Comment: 10/13/2012 "haven't had anything to drink for years"    Review of Systems  Constitutional: Positive for fever.  HENT: Negative.   Respiratory: Positive for cough.   Cardiovascular: Negative.   Gastrointestinal: Negative.   Musculoskeletal: Positive for gait problem.       Chronically unsteady gait. Flexion contracture left elbow.  Skin: Negative.   Neurological: Negative.   Psychiatric/Behavioral: Negative.   All other systems reviewed and are negative.    Allergies  Review of patient's allergies indicates no known allergies.  Home Medications   Current Outpatient Rx  Name  Route  Sig  Dispense  Refill  . albuterol-ipratropium (COMBIVENT) 18-103 MCG/ACT inhaler   Inhalation   Inhale 1 puff into the lungs every 4 (four) hours.         Marland Kitchen aspirin 81 MG tablet   Oral   Take 81 mg by mouth daily.         Marland Kitchen atorvastatin (LIPITOR) 40 MG tablet   Oral   Take 40 mg by mouth at bedtime.          . brimonidine (ALPHAGAN P) 0.1 % SOLN   Both Eyes   Place 1 drop into both eyes 2 (two) times daily.         . clopidogrel (PLAVIX) 75 MG tablet   Oral   Take 75 mg  by mouth daily.         . dorzolamide (TRUSOPT) 2 % ophthalmic solution   Both Eyes   Place 1 drop into both eyes 3 (three) times daily.         . Fluticasone-Salmeterol (ADVAIR DISKUS) 250-50 MCG/DOSE AEPB   Inhalation   Inhale 2 puffs into the lungs daily.          . furosemide (LASIX) 20 MG tablet   Oral   Take 20 mg by mouth daily as needed. For edema         . losartan (COZAAR) 25 MG tablet   Oral   Take 25 mg by mouth daily.         . Multiple Vitamin (MULTIVITAMIN) tablet   Oral   Take 1 tablet by mouth daily.           Marland Kitchen oxyCODONE-acetaminophen (PERCOCET) 5-325 MG per tablet   Oral   Take 0.5-1 tablets by mouth every 4 (four) hours as needed. For pain         . potassium chloride (KLOR-CON) 20 MEQ packet   Oral   Take 20 mEq by mouth daily as needed (for  edema).         . senna-docusate (SENOKOT-S) 8.6-50 MG per tablet   Oral   Take 1 tablet by mouth at bedtime as needed for mild constipation.          BP 184/70  Pulse 85  Temp(Src) 101.4 F (38.6 C) (Rectal)  Resp 28  SpO2 100% Physical Exam  Nursing note and vitals reviewed. Constitutional: He appears well-developed and well-nourished.  HENT:  Head: Normocephalic and atraumatic.  Eyes: Conjunctivae are normal. Pupils are equal, round, and reactive to light.  Neck: Neck supple. No tracheal deviation present. No thyromegaly present.  Cardiovascular: Normal rate and regular rhythm.   No murmur heard. Pulmonary/Chest: Effort normal.  Expiratory wheezes. Mildly tachypneic.  Abdominal: Soft. Bowel sounds are normal. He exhibits no distension. There is no tenderness.  Musculoskeletal: Normal range of motion. He exhibits no edema and no tenderness.  Left upper extremity there is a subcutaneous hematoma at the medial aspect of the distal upper arm. Nontender. No bony tenderness. Radial pulse 2+. Flexion contracture of left elbow.  Neurological: He is alert. Coordination normal.  Skin: Skin is warm and dry. No rash noted.  Psychiatric: He has a normal mood and affect.    ED Course  Procedures (including critical care time) Labs Review Labs Reviewed  BASIC METABOLIC PANEL  CBC WITH DIFFERENTIAL   Imaging Review No results found.  EKG Interpretation    Date/Time:  Tuesday February 06 2013 00:46:25 EST Ventricular Rate:  87 PR Interval:    QRS Duration: 97 QT Interval:  381 QTC Calculation: 458 R Axis:   45 Text Interpretation:  Atrial fibrillation Left ventricular hypertrophy Anterior Q waves, possibly due to LVH Nonspecific T abnormalities, lateral leads No significant change since last tracing Confirmed by Winfred Leeds  MD, Darrell Leonhardt ([id number]) on 02/06/2013 5:44:03 AM           Results for orders placed during the hospital encounter of 85/88/50  BASIC METABOLIC PANEL       Result Value Range   Sodium 143  137 - 147 mEq/L   Potassium 3.4 (*) 3.7 - 5.3 mEq/L   Chloride 105  96 - 112 mEq/L   CO2 23  19 - 32 mEq/L   Glucose, Bld 103 (*) 70 - 99 mg/dL  BUN 18  6 - 23 mg/dL   Creatinine, Ser 0.88  0.50 - 1.35 mg/dL   Calcium 8.7  8.4 - 10.5 mg/dL   GFR calc non Af Amer 77 (*) >90 mL/min   GFR calc Af Amer 89 (*) >90 mL/min  CBC WITH DIFFERENTIAL      Result Value Range   WBC 6.6  4.0 - 10.5 K/uL   RBC 4.12 (*) 4.22 - 5.81 MIL/uL   Hemoglobin 11.7 (*) 13.0 - 17.0 g/dL   HCT 36.0 (*) 39.0 - 52.0 %   MCV 87.4  78.0 - 100.0 fL   MCH 28.4  26.0 - 34.0 pg   MCHC 32.5  30.0 - 36.0 g/dL   RDW 13.6  11.5 - 15.5 %   Platelets 86 (*) 150 - 400 K/uL   Neutrophils Relative % 66  43 - 77 %   Neutro Abs 4.3  1.7 - 7.7 K/uL   Lymphocytes Relative 19  12 - 46 %   Lymphs Abs 1.2  0.7 - 4.0 K/uL   Monocytes Relative 14 (*) 3 - 12 %   Monocytes Absolute 0.9  0.1 - 1.0 K/uL   Eosinophils Relative 2  0 - 5 %   Eosinophils Absolute 0.1  0.0 - 0.7 K/uL   Basophils Relative 0  0 - 1 %   Basophils Absolute 0.0  0.0 - 0.1 K/uL   Dg Chest 2 View  02/06/2013   CLINICAL DATA:  Shortness of breath.  Coughing up sputum.  Fever.  EXAM: CHEST  2 VIEW  COMPARISON:  10/14/2012  FINDINGS: Streaky lower lung opacities. Lung volumes are mildly low. Chronic cardiomegaly, status post aortic valve replacement. Mediastinal contours distorted by rightward rotation. No effusion or pneumothorax. No pulmonary edema.  IMPRESSION: Bibasilar opacities, morphology favoring atelectasis over pneumonia.   Electronically Signed   By: Jorje Guild M.D.   On: 02/06/2013 03:08   Ct Head Wo Contrast  01/10/2013   CLINICAL DATA:  Stroke. Cerebral artery occlusion. Left arm weakness  EXAM: CT HEAD WITHOUT CONTRAST  TECHNIQUE: Contiguous axial images were obtained from the base of the skull through the vertex without intravenous contrast.  COMPARISON:  CT 10/13/2012  FINDINGS: Chronic right MCA infarct involving  the right frontal and temporal lobe and right basal ganglia. This is unchanged. There is enlargement of the right lateral ventricle due to volume loss from chronic infarct. Chronic microvascular ischemic changes in the white matter. Chronic lacunar infarct in the left thalamus.  Negative for acute infarct.  Negative for hemorrhage or mass lesion.  Calvarium is intact. Atherosclerotic calcification in the carotid artery bilaterally.  IMPRESSION: Atrophy and chronic ischemia.  Chronic right MCA infarct.  No acute abnormality.   Electronically Signed   By: Franchot Gallo M.D.   On: 01/10/2013 15:00    Chest x-ray viewed by me MDM  No diagnosis found. In light of cough, abnormal lung sounds, fever, abnormal chest x-ray we'll treat for community acquired pneumonia. Spoke with hospitalist physician who will arrange for inpatient stay Diagnosis #1 community acquired pneumonia  #2 hypokalemia   Orlie Dakin, MD 02/06/13 [id number]  Orlie Dakin, MD 02/06/13 [phone]

## 2013-02-06 NOTE — ED Notes (Addendum)
Per EMS pt presents to the department with a productive cough, pt's daughter reports the pt is coughing up yellow sputum. EMS reports the pt also c/o nasal drainage. Pt had a fall this evening at home while ambulating by himself with his cane, pt's daughter states the pt did not lose consciousness and that he fell onto his right side. Pt's daughter reports the pt also presents with a lump on his left upper arm that she did not notice until this evening, pt's daughter states she did not notice the lump this morning while bathing the pt. Pt denies any pain at this time. Pt is 96% on RA. Pt is A&O X4. Pt has a hx of HTN and a stroke in 1969, pt has left sided deficits from the stroke. Pt's wife was dx with the flu this past week.

## 2013-02-06 NOTE — ED Notes (Signed)
Admitting MD at BS.  

## 2013-02-06 NOTE — ED Notes (Signed)
MD at bedside. 

## 2013-02-06 NOTE — Evaluation (Signed)
Physical Therapy Evaluation Patient Details Name: KENDAN CORNFORTH MRN: [id number] DOB: 1926-11-01 Today's Date: 02/06/2013 Time: 1536-1610 PT Time Calculation (min): 34 min  PT Assessment / Plan / Recommendation History of Present Illness  80 yo male comes in with 4 days of cough with worse sob and fever.  No sick contacts. Lives at home with family and has not had any recent hospitalizations. Pt reports he does cough frequently when eating or drinking. Not on oxygen at home normally. 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  Clinical Impression  Pt admitted with dyspnea and fever. Pt with h/o Lt hemiparesis, however was functioning at home with family's assist. Pt currently with functional limitations due to the deficits listed below (see PT Problem List).  Pt will benefit from skilled PT to increase their independence and safety with mobility to allow discharge to the venue listed below.       PT Assessment  Patient needs continued PT services    Follow Up Recommendations  Home health PT;Supervision/Assistance - 24 hour    Does the patient have the potential to tolerate intense rehabilitation      Barriers to Discharge        Equipment Recommendations  None recommended by PT    Recommendations for Other Services OT consult   Frequency Min 3X/week    Precautions / Restrictions Precautions Precautions: Fall   Pertinent Vitals/Pain SaO2 on 2L after breathing treatment 90%; unable to get a reading after standing x 2 minutes, and then 90% (on 2L throughout session) Denied pain      Mobility  Bed Mobility Overal bed mobility: Needs Assistance Bed Mobility: Rolling;Supine to Sit;Sit to Supine Rolling: Mod assist Supine to sit: Max assist;HOB elevated Sit to supine: Max assist General bed mobility comments: pt able to follow one step commands with incr time; requires assist due to Lt  hemiparesis and incr tone Transfers Overall transfer level: Needs assistance Equipment used: 1 person hand held assist Transfers: Sit to/from Stand;Lateral/Scoot Transfers Sit to Stand: Mod assist  Lateral/Scoot Transfers: Max assist General transfer comment: pushing posteriorly as coming to stand; lateral scoot along EOB to his Left (towards [facility]) with good effort, however requires incr assist    Exercises     PT Diagnosis: Difficulty walking;Generalized weakness;Hemiplegia non-dominant side  PT Problem List: Decreased strength;Decreased activity tolerance;Decreased balance;Decreased mobility;Decreased knowledge of use of DME;Cardiopulmonary status limiting activity;Impaired tone;Impaired sensation PT Treatment Interventions: DME instruction;Gait training;Stair training;Functional mobility training;Therapeutic activities;Balance training;Patient/family education     PT Goals(Current goals can be found in the care plan section) Acute Rehab PT Goals Patient Stated Goal: agrees to getting strength back PT Goal Formulation: With patient Time For Goal Achievement: 02/13/13 Potential to Achieve Goals: Good  Visit Information  Last PT Received On: 02/06/13 Assistance Needed: +2 History of Present Illness: 80 yo male comes in with 4 days of cough with worse sob and fever.  No sick contacts. Lives at home with family and has not had any recent hospitalizations. Pt reports he does cough frequently when eating or drinking. Not on oxygen at home normally. 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       Prior [place] expects to be discharged to:: Private residence Living Arrangements: Children Available Help at Discharge: Family Type of Home: House Home Access:  Stairs to enter [employer] of Steps: 4 Entrance Stairs-Rails: Right Home Layout: One level Home  Equipment: Wheelchair - manual;Hospital bed;Bedside commode (hemiwalker) Additional Comments: has tub with "cut side" so he can get in/out easier Prior Function Level of Independence: Needs assistance Gait / Transfers Assistance Needed: ambulated with hemiwalker; had begun to turn sideways in last few months (per daughter) ADL's / Homemaking Assistance Needed: family assisted with ADL Comments: pt reported using a RW, spoke to daughter via phone and she stated it was a Patent examiner (?cognition vs expressive difficulties) Communication Communication: No difficulties    Cognition  Cognition Arousal/Alertness: Lethargic Behavior During Therapy: WFL for tasks assessed/performed Overall Cognitive Status: History of cognitive impairments - at baseline    Extremity/Trunk Assessment Upper Extremity Assessment Upper Extremity Assessment: LUE deficits/detail LUE Deficits / Details: PROM to shoulder, elbow, wrist limited due to contractures Lower Extremity Assessment Lower Extremity Assessment: Generalized weakness;LLE deficits/detail LLE Deficits / Details: Lt hemiparesis from prior CVA; ankle DF to neutral only (very little AROM at ankle); hip flexion 2+, knee extension 3/5 LLE Sensation: decreased light touch Cervical / Trunk Assessment Cervical / Trunk Assessment: Other exceptions Cervical / Trunk Exceptions: leans left   Balance Balance Overall balance assessment: Needs assistance Sitting-balance support: Single extremity supported;Feet supported Sitting balance-Leahy Scale: Fair Sitting balance - Comments: sat EOB with supervision ~4 minutes Postural control: Posterior lean;Left lateral lean Standing balance support: Single extremity supported Standing balance-Leahy Scale: Poor Standing balance comment: posterior and left lean General Comments General comments (skin integrity, edema, etc.): Pt with wet linens on arrival. Assisted into dry gown. As pt coughing he was again incontinent of  urine. Bed pads and gown changed once supine. RN made aware and ? if condom catheter appropriate  End of Session PT - End of Session Equipment Utilized During Treatment: Gait belt;Oxygen Activity Tolerance: Patient limited by fatigue;Treatment limited secondary to medical complications (Comment) (decr SaO2) Patient left: in bed;with call bell/phone within reach;with family/visitor present Nurse Communication: Mobility status;Other (comment) (incontinence; ? condom catheter approp)  GP     Stella Bortle 02/06/2013, 4:30 PM Pager [phone]

## 2013-02-06 NOTE — H&P (Signed)
PCP:   Kandice Hams, MD   Chief Complaint:  cough  HPI: 80 yo male h/o cva with left sided hemiparesis, htn, chronic diastolic chf, AVR comes in with 4 days of cough with worse sob today.  Pt denies any fevers at home, but febrile here in ED.  He is incontinent of bowel and bladder from previous cva.  Has not noticed any foul smell to urine.  No n/v/d.  No abd pain.  No rashes.  No sick contacts.  Lives at home with family and has not had any recent hospitalizations.  Pt reports he does cough frequently when eating or drinking.  Not on oxygen at home normally.  No le edema or swelling.  No mental status changes.  Pt able to answer all his own questions.  Review of Systems:  Positive and negative as per HPI otherwise all other systems are negative  Past Medical History: Past Medical History  Diagnosis Date  . Acquired musculoskeletal deformity of unspecified site   . Chronic diastolic heart failure     EF >= 50%  . HTN (hypertension), benign     systematic  . Pure hypercholesterolemia   . COPD (chronic obstructive pulmonary disease)   . Benign neoplasm of colon   . 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 Iron deficiency anemia, unspecified   . LFTs abnormal 3/06    LFTs wnl, Cr - 1.3 stable. 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 colonoscopy 3/05- adenomatous polyps, mild gastritis-diffuse interosseous skeletal hypertosis (DISH), h/o H. pylori s/p tx.  . Stroke   . Hemiparesis, left   . Aortic valve insufficiency    Past Surgical History  Procedure Laterality Date  . Cardiac catheterization  1/99    mild CAD; EF 35-40%  . Aortic valve replacement      with pericardial tissue valve  . Cardiac valve replacement    . Multiple tooth extractions      Medications: Prior to Admission medications   Medication Sig Start Date End Date Taking? Authorizing  Provider  albuterol-ipratropium (COMBIVENT) 18-103 MCG/ACT inhaler Inhale 1 puff into the lungs every 4 (four) hours.   Yes Historical Provider, MD  aspirin 81 MG tablet Take 81 mg by mouth daily.   Yes Historical Provider, MD  atorvastatin (LIPITOR) 40 MG tablet Take 40 mg by mouth at bedtime.    Yes Historical Provider, MD  brimonidine (ALPHAGAN P) 0.1 % SOLN Place 1 drop into both eyes 2 (two) times daily.   Yes Historical Provider, MD  clopidogrel (PLAVIX) 75 MG tablet Take 75 mg by mouth daily.   Yes Historical Provider, MD  dorzolamide (TRUSOPT) 2 % ophthalmic solution Place 1 drop into both eyes 3 (three) times daily.   Yes Historical Provider, MD  Fluticasone-Salmeterol (ADVAIR DISKUS) 250-50 MCG/DOSE AEPB Inhale 2 puffs into the lungs daily.    Yes Historical Provider, MD  furosemide (LASIX) 20 MG tablet Take 20 mg by mouth daily as needed. For edema   Yes Historical Provider, MD  losartan (COZAAR) 25 MG tablet Take 25 mg by mouth daily.   Yes Historical Provider, MD  Multiple Vitamin (MULTIVITAMIN) tablet Take 1 tablet by mouth daily.     Yes Historical Provider, MD  oxyCODONE-acetaminophen (PERCOCET) 5-325 MG per tablet Take 0.5-1 tablets by mouth every 4 (four) hours as needed. For pain   Yes Historical Provider, MD  potassium chloride (KLOR-CON) 20 MEQ packet Take 20 mEq by mouth daily as needed (for edema).   Yes Historical Provider, MD  senna-docusate (SENOKOT-S) 8.6-50 MG per tablet Take 1 tablet by mouth at bedtime as needed for mild constipation.   Yes Historical Provider, MD    Allergies:  No Known Allergies  Social History:  reports that he has quit smoking. His smoking use included Cigarettes. He smoked 0.00 packs per day. He has never used smokeless tobacco. He reports that he drinks alcohol. He reports that he does not use illicit drugs.  Family History: Family History  Problem Relation Age of Onset  . Colon cancer Neg Hx   . Heart disease Father     Physical  Exam: Filed Vitals:   02/06/13 0300 02/06/13 0311 02/06/13 0330 02/06/13 0345  BP: 125/66  141/67 136/63  Pulse: 86  87 85  Temp:  100.8 F (38.2 C)    TempSrc:  Oral    Resp: 25  26 24   SpO2: 91%   100%   General appearance: alert, cooperative and no distress Head: Normocephalic, without obvious abnormality, atraumatic Eyes: negative Nose: Nares normal. Septum midline. Mucosa normal. No drainage or sinus tenderness. Neck: no JVD and supple, symmetrical, trachea midline Lungs: rhonchi bilaterally Heart: regular rate and rhythm, S1, S2 normal, no murmur, click, rub or gallop Abdomen: soft, non-tender; bowel sounds normal; no masses,  no organomegaly Extremities: extremities normal, atraumatic, no cyanosis or edema Pulses: 2+ and symmetric Skin: Skin color, texture, turgor normal. No rashes or lesions Neurologic: Mental status: Alert, oriented, thought content appropriate Cranial nerves: normal  Left hemiparesis old    Labs on Admission:   Recent Labs  02/06/13 0157  NA 143  K 3.4*  CL 105  CO2 23  GLUCOSE 103*  BUN 18  CREATININE 0.88  CALCIUM 8.7     Recent Labs  02/06/13 0157  WBC 6.6  NEUTROABS 4.3  HGB 11.7*  HCT 36.0*  MCV 87.4  PLT 86*    Radiological Exams on Admission: Dg Chest 2 View  02/06/2013   CLINICAL DATA:  Shortness of breath.  Coughing up sputum.  Fever.  EXAM: CHEST  2 VIEW  COMPARISON:  10/14/2012  FINDINGS: Streaky lower lung opacities. Lung volumes are mildly low. Chronic cardiomegaly, status post aortic valve replacement. Mediastinal contours distorted by rightward rotation. No effusion or pneumothorax. No pulmonary edema.  IMPRESSION: Bibasilar opacities, morphology favoring atelectasis over pneumonia.   Electronically Signed   By: Jorje Guild M.D.   On: 02/06/2013 03:08    Assessment/Plan 80 yo male with cough, fever, hypoxia  Principal Problem:   Fever-  Probable source lung.  Flu pcr is pending.  Cover with tamiflu and for CAP  although cxr is not convincing of any infiltrates.  ua is pending.  Will ck swallow , high risk for aspirating.  Active Problems:   HYPERTENSION, BENIGN SYSTEMIC  stable   CHF, EJECTION FRACTION > OR = 50%  stable   COPD  stable   CEREBROVAS DIS, LATE EFFECTS (S/P CVA) stable   Left hemiparesis stable   PNA (pneumonia)  As above, pna vs influenza  pcp is dr Librarian, academic.    Jacquelinne Speak A 02/06/2013, 4:27 AM

## 2013-02-06 NOTE — Progress Notes (Signed)
Utilization review completed.  

## 2013-02-06 NOTE — Progress Notes (Signed)
Subjective: Patient pleasant, no apparent distress. Admission H&P reviewed, case discussed with daughter. Patient had one day of fever cough. No abdominal pain, no change in odor of urine. She states patient did receive his flu vaccine  Objective: Vital signs in last 24 hours: Temp:  [98.7 F (37.1 C)-101.5 F (38.6 C)] 99 F (37.2 C) (02/03 1013) Pulse Rate:  [79-88] 79 (02/03 1013) Resp:  [16-28] 18 (02/03 1013) BP: (125-184)/(63-102) 139/64 mmHg (02/03 1013) SpO2:  [91 %-100 %] 99 % (02/03 1013) Weight:  [66.089 kg (145 lb 11.2 oz)] 66.089 kg (145 lb 11.2 oz) (02/03 [id number]) Weight change:     Intake/Output from previous day: 02/02 0701 - 02/03 0700 In: 50 [I.V.:50] Out: -  Intake/Output this shift:    General appearance: alert and cooperative Resp: bibasilar rhonchi, some improvement with deep cough Cardio: regular rate and rhythm Extremities: no edema, patient does have a bruise in the left forearm with some hematoma associated with it Neurologic: Motor: left hemiparesis  Lab Results:  Results for orders placed during the hospital encounter of 02/06/13 (from the past 24 hour(s))  BASIC METABOLIC PANEL     Status: Abnormal   Collection Time    02/06/13  1:57 AM      Result Value Range   Sodium 143  137 - 147 mEq/L   Potassium 3.4 (*) 3.7 - 5.3 mEq/L   Chloride 105  96 - 112 mEq/L   CO2 23  19 - 32 mEq/L   Glucose, Bld 103 (*) 70 - 99 mg/dL   BUN 18  6 - 23 mg/dL   Creatinine, Ser 0.88  0.50 - 1.35 mg/dL   Calcium 8.7  8.4 - 10.5 mg/dL   GFR calc non Af Amer 77 (*) >90 mL/min   GFR calc Af Amer 89 (*) >90 mL/min  CBC WITH DIFFERENTIAL     Status: Abnormal   Collection Time    02/06/13  1:57 AM      Result Value Range   WBC 6.6  4.0 - 10.5 K/uL   RBC 4.12 (*) 4.22 - 5.81 MIL/uL   Hemoglobin 11.7 (*) 13.0 - 17.0 g/dL   HCT 36.0 (*) 39.0 - 52.0 %   MCV 87.4  78.0 - 100.0 fL   MCH 28.4  26.0 - 34.0 pg   MCHC 32.5  30.0 - 36.0 g/dL   RDW 13.6  11.5 - 15.5 %   Platelets 86 (*) 150 - 400 K/uL   Neutrophils Relative % 66  43 - 77 %   Neutro Abs 4.3  1.7 - 7.7 K/uL   Lymphocytes Relative 19  12 - 46 %   Lymphs Abs 1.2  0.7 - 4.0 K/uL   Monocytes Relative 14 (*) 3 - 12 %   Monocytes Absolute 0.9  0.1 - 1.0 K/uL   Eosinophils Relative 2  0 - 5 %   Eosinophils Absolute 0.1  0.0 - 0.7 K/uL   Basophils Relative 0  0 - 1 %   Basophils Absolute 0.0  0.0 - 0.1 K/uL  INFLUENZA PANEL BY PCR (TYPE A & B, H1N1)     Status: None   Collection Time    02/06/13  2:38 AM      Result Value Range   Influenza A By PCR NEGATIVE  NEGATIVE   Influenza B By PCR NEGATIVE  NEGATIVE   H1N1 flu by pcr NOT DETECTED  NOT DETECTED  LACTIC ACID, PLASMA     Status: None  Collection Time    02/06/13  4:10 AM      Result Value Range   Lactic Acid, Venous 1.4  0.5 - 2.2 mmol/L  BASIC METABOLIC PANEL     Status: Abnormal   Collection Time    02/06/13  8:05 AM      Result Value Range   Sodium 139  137 - 147 mEq/L   Potassium 3.9  3.7 - 5.3 mEq/L   Chloride 104  96 - 112 mEq/L   CO2 20  19 - 32 mEq/L   Glucose, Bld 120 (*) 70 - 99 mg/dL   BUN 16  6 - 23 mg/dL   Creatinine, Ser 0.80  0.50 - 1.35 mg/dL   Calcium 8.5  8.4 - 10.5 mg/dL   GFR calc non Af Amer 80 (*) >90 mL/min   GFR calc Af Amer >90  >90 mL/min  CBC WITH DIFFERENTIAL     Status: Abnormal   Collection Time    02/06/13  8:05 AM      Result Value Range   WBC 9.1  4.0 - 10.5 K/uL   RBC 4.06 (*) 4.22 - 5.81 MIL/uL   Hemoglobin 11.5 (*) 13.0 - 17.0 g/dL   HCT 35.1 (*) 39.0 - 52.0 %   MCV 86.5  78.0 - 100.0 fL   MCH 28.3  26.0 - 34.0 pg   MCHC 32.8  30.0 - 36.0 g/dL   RDW 13.6  11.5 - 15.5 %   Platelets 79 (*) 150 - 400 K/uL   Neutrophils Relative % 65  43 - 77 %   Neutro Abs 5.9  1.7 - 7.7 K/uL   Lymphocytes Relative 21  12 - 46 %   Lymphs Abs 1.9  0.7 - 4.0 K/uL   Monocytes Relative 13 (*) 3 - 12 %   Monocytes Absolute 1.2 (*) 0.1 - 1.0 K/uL   Eosinophils Relative 1  0 - 5 %   Eosinophils Absolute  0.1  0.0 - 0.7 K/uL   Basophils Relative 0  0 - 1 %   Basophils Absolute 0.0  0.0 - 0.1 K/uL      Studies/Results: Dg Chest 2 View  02/06/2013   CLINICAL DATA:  Shortness of breath.  Coughing up sputum.  Fever.  EXAM: CHEST  2 VIEW  COMPARISON:  10/14/2012  FINDINGS: Streaky lower lung opacities. Lung volumes are mildly low. Chronic cardiomegaly, status post aortic valve replacement. Mediastinal contours distorted by rightward rotation. No effusion or pneumothorax. No pulmonary edema.  IMPRESSION: Bibasilar opacities, morphology favoring atelectasis over pneumonia.   Electronically Signed   By: Jorje Guild M.D.   On: 02/06/2013 03:08    Medications:  Prior to Admission:  Prescriptions prior to admission  Medication Sig Dispense Refill  . albuterol-ipratropium (COMBIVENT) 18-103 MCG/ACT inhaler Inhale 1 puff into the lungs every 4 (four) hours.      Marland Kitchen aspirin 81 MG tablet Take 81 mg by mouth daily.      Marland Kitchen atorvastatin (LIPITOR) 40 MG tablet Take 40 mg by mouth at bedtime.       . brimonidine (ALPHAGAN P) 0.1 % SOLN Place 1 drop into both eyes 2 (two) times daily.      . clopidogrel (PLAVIX) 75 MG tablet Take 75 mg by mouth daily.      . dorzolamide (TRUSOPT) 2 % ophthalmic solution Place 1 drop into both eyes 3 (three) times daily.      . Fluticasone-Salmeterol (ADVAIR DISKUS) 250-50 MCG/DOSE AEPB Inhale  2 puffs into the lungs daily.       . furosemide (LASIX) 20 MG tablet Take 20 mg by mouth daily as needed. For edema      . losartan (COZAAR) 25 MG tablet Take 25 mg by mouth daily.      . Multiple Vitamin (MULTIVITAMIN) tablet Take 1 tablet by mouth daily.        Marland Kitchen oxyCODONE-acetaminophen (PERCOCET) 5-325 MG per tablet Take 0.5-1 tablets by mouth every 4 (four) hours as needed. For pain      . potassium chloride (KLOR-CON) 20 MEQ packet Take 20 mEq by mouth daily as needed (for edema).      . senna-docusate (SENOKOT-S) 8.6-50 MG per tablet Take 1 tablet by mouth at bedtime as needed  for mild constipation.       Scheduled: . aspirin  81 mg Oral Daily  . atorvastatin  40 mg Oral QHS  . [START ON 02/07/2013] azithromycin  500 mg Intravenous Q24H  . [START ON 02/07/2013] cefTRIAXone (ROCEPHIN)  IV  1 g Intravenous Q24H  . clopidogrel  75 mg Oral Daily  . dorzolamide  1 drop Both Eyes TID  . enoxaparin (LOVENOX) injection  40 mg Subcutaneous Q24H  . ipratropium-albuterol  3 mL Nebulization Q4H  . losartan  25 mg Oral Daily  . mometasone-formoterol  2 puff Inhalation BID  . oseltamivir  75 mg Oral BID  . potassium chloride  20 mEq Oral Daily  . sodium chloride  3 mL Intravenous Q12H   Continuous:  UVO:[id number] chloride, oxyCODONE-acetaminophen, senna-docusate, sodium chloride  Assessment/Plan: Fever and a cough an elderly male. Chest x-ray showed atelectasis versus infiltrate. Continue antibiotic to cover potential community-acquired pneumonia. Flu swab negative therefore we will DC Tamiflu and DC isolation. Followup blood culture. History of CVA with left hemiparesis as well as dysarthria and dysphasia. Patient had a dysphasia evaluation in October of last year, he should be on a dysphagia diet 2 with thin liquids . Okay to await reevaluation by speech therapy. For now okay to continue patient's medication Status post aVR Hyperlipidemia Hypertension History of some chronic low back pain  LOS: 0 days   Maryori Weide D 02/06/2013, 12:08 PM

## 2013-02-06 NOTE — Evaluation (Signed)
Clinical/Bedside Swallow Evaluation Patient Details  Name: Aaron Johnson MRN: [id number] Date of Birth: 09-Nov-1926  Today's Date: 02/06/2013 Time: 1230-1250 SLP Time Calculation (min): 20 min  Past Medical History:  Past Medical History  Diagnosis Date  . Acquired musculoskeletal deformity of unspecified site   . Chronic diastolic heart failure     EF >= 50%  . HTN (hypertension), benign     systematic  . Pure hypercholesterolemia   . COPD (chronic obstructive pulmonary disease)   . Benign neoplasm of colon   . 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 Iron deficiency anemia, unspecified   . LFTs abnormal 3/06    LFTs wnl, Cr - 1.3 stable. 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 colonoscopy 3/05- adenomatous polyps, mild gastritis-diffuse interosseous skeletal hypertosis (DISH), h/o H. pylori s/p tx.  . Stroke   . Hemiparesis, left   . Aortic valve insufficiency   . CHF (congestive heart failure)    Past Surgical History:  Past Surgical History  Procedure Laterality Date  . Cardiac catheterization  1/99    mild CAD; EF 35-40%  . Aortic valve replacement      with pericardial tissue valve  . Cardiac valve replacement    . Multiple tooth extractions     HPI:  80 yo male h/o cva with left sided hemiparesis, htn, chronic diastolic chf, AVR comes in with 4 days of cough with worse sob today.  Pt denies any fevers at home, but febrile here in ED.  He is incontinent of bowel and bladder from previous cva.  Has not noticed any foul smell to urine.  No n/v/d.  No abd pain.  No rashes.  No sick contacts.  Lives at home with family and has not had any recent hospitalizations.  Pt reports he does cough frequently when eating or drinking.  Not on oxygen at home normally.  No le edema or swelling.  No mental status changes.  Pt able to answer all his own questions.02/06/2013    CLINICAL DATA:  Shortness of breath.  Coughing up sputum.  Fever.  EXAM: CHEST  2 VIEW  COMPARISON:  10/14/2012  FINDINGS: Streaky lower lung opacities. Lung volumes are mildly low. Chronic cardiomegaly, status post aortic valve replacement. Mediastinal contours distorted by rightward rotation. No effusion or pneumothorax. No pulmonary edema.  IMPRESSION: Bibasilar opacities, morphology favoring atelectasis over pneumonia.   Electronically Signed   By: Jorje Guild    Assessment / Plan / Recommendation Clinical Impression  Pt presents with moderate oral dysphagia and mild to moderate pharyngeal and cervical dysphagia, consistent with MBSS 10/14/12. Pt with reduced mastication with oral weakness and edentulous. Pt observed to cough before PO administered and  during swallow eval, indicating possible reduced airway protection vs pna. Daughter present during evaluation. She prepares soft foods for pt at home and cues him to his swallow protocol per MBSS. MBSS 10/14/12 recommended Dys 2 diet , thin liquids via cup and straw, and multiple dry swallows after each bite and sitp to clear pharyngeal pooling. In light of fatigue and illness, I recommned pt receive puree solids and single small sips of thin liquds via cup with MBS 02/07/13 to r/o aspiration and worsening dysphagia. ST to follow for diet tolerance during hospital stay.    Aspiration Risk  Moderate    Diet Recommendation Dysphagia 1 (Puree);Thin liquid   Liquid  Administration via: Spoon Medication Administration: Crushed with puree Supervision: Full supervision/cueing for compensatory strategies;Trained caregiver to feed patient Compensations: Effortful swallow;Multiple dry swallows after each bite/sip Postural Changes and/or Swallow Maneuvers: Seated upright 90 degrees    Other  Recommendations Recommended Consults: MBS Oral Care Recommendations: Oral care BID   Follow Up Recommendations  Home health SLP    Frequency and Duration min  2x/week  2 weeks   Pertinent Vitals/Pain Pt denied pain    SLP Swallow Goals  per MBSS 02/07/13   Swallow Study Prior Functional Status       General Date of Onset: 02/05/13 HPI: 80 yo male h/o cva with left sided hemiparesis, htn, chronic diastolic chf, AVR comes in with 4 days of cough with worse sob today.  Pt denies any fevers at home, but febrile here in ED.  He is incontinent of bowel and bladder from previous cva.  Has not noticed any foul smell to urine.  No n/v/d.  No abd pain.  No rashes.  No sick contacts.  Lives at home with family and has not had any recent hospitalizations.  Pt reports he does cough frequently when eating or drinking.  Not on oxygen at home normally.  No le edema or swelling.  No mental status changes.  Pt able to answer all his own questions.02/06/2013   CLINICAL DATA:  Shortness of breath.  Coughing up sputum.  Fever.  EXAM: CHEST  2 VIEW  COMPARISON:  10/14/2012  FINDINGS: Streaky lower lung opacities. Lung volumes are mildly low. Chronic cardiomegaly, status post aortic valve replacement. Mediastinal contours distorted by rightward rotation. No effusion or pneumothorax. No pulmonary edema.  IMPRESSION: Bibasilar opacities, morphology favoring atelectasis over pneumonia.   Electronically Signed   By: Jorje Guild  Type of Study: Bedside swallow evaluation Previous Swallow Assessment: MBSS 10/14/12 revealed moderate oral dysphagia and mild phraryngeal and cervical dysphagia. Pooling in pharynx, recommend Dys2, thin with multiple dry swallows, effortful swallow and small bites, slow rate Diet Prior to this Study: Dysphagia 2 (chopped);Thin liquids Temperature Spikes Noted: Yes (low grade) Respiratory Status: Nasal cannula History of Recent Intubation: No Behavior/Cognition: Alert;Confused;Requires cueing Oral Cavity - Dentition: Edentulous Self-Feeding Abilities: Able to feed self with adaptive devices;Needs assist Patient Positioning: Upright in bed Baseline  Vocal Quality: Clear Volitional Cough: Strong Volitional Swallow: Able to elicit    Oral/Motor/Sensory Function Labial ROM: Reduced right Labial Symmetry: Within Functional Limits Labial Strength: Reduced Lingual ROM: Reduced left Lingual Symmetry: Within Functional Limits Facial ROM: Within Functional Limits Mandible: Within Functional Limits   Ice Chips Ice chips: Within functional limits Presentation: Spoon   Thin Liquid Thin Liquid: Impaired Presentation: Cup;Spoon;Straw Oral Phase Functional Implications: Right anterior spillage Pharyngeal  Phase Impairments: Cough - Immediate Other Comments: inconsistent cough before and during PO trials    Nectar Thick Nectar Thick Liquid: Not tested   Honey Thick Honey Thick Liquid: Not tested   Puree Puree: Impaired Pharyngeal Phase Impairments: Cough - Immediate   Solid   GO    Solid: Impaired Oral Phase Impairments: Impaired mastication Oral Phase Functional Implications: Oral residue Pharyngeal Phase Impairments: Cough - Delayed       Lovvorn, Annye Rusk 02/06/2013,1:04 PM

## 2013-02-07 ENCOUNTER — Inpatient Hospital Stay (HOSPITAL_COMMUNITY): Payer: PRIVATE HEALTH INSURANCE

## 2013-02-07 LAB — URINALYSIS, ROUTINE W REFLEX MICROSCOPIC
Bilirubin Urine: NEGATIVE
GLUCOSE, UA: NEGATIVE mg/dL
KETONES UR: NEGATIVE mg/dL
Leukocytes, UA: NEGATIVE
Nitrite: NEGATIVE
PROTEIN: 100 mg/dL — AB
Specific Gravity, Urine: 1.025 (ref 1.005–1.030)
UROBILINOGEN UA: 1 mg/dL (ref 0.0–1.0)
pH: 6 (ref 5.0–8.0)

## 2013-02-07 LAB — LEGIONELLA ANTIGEN, URINE: Legionella Antigen, Urine: NEGATIVE

## 2013-02-07 LAB — URINE MICROSCOPIC-ADD ON

## 2013-02-07 LAB — STREP PNEUMONIAE URINARY ANTIGEN: STREP PNEUMO URINARY ANTIGEN: NEGATIVE

## 2013-02-07 MED ORDER — ACETAMINOPHEN 325 MG PO TABS: 650.0000 mg | ORAL_TABLET | Freq: Four times a day (QID) | ORAL | Status: DC | PRN

## 2013-02-07 NOTE — Progress Notes (Signed)
Physical Therapy Treatment Patient Details Name: Aaron Johnson MRN: [id number] DOB: Mar 19, 1926 Today's Date: 02/07/2013 Time: 5638-7564 PT Time Calculation (min): 29 min  PT Assessment / Plan / Recommendation  History of Present Illness 80 yo male comes in with 4 days of cough with worse sob and fever.  No sick contacts. Lives at home with family and has not had any recent hospitalizations. Pt reports he does cough frequently when eating or drinking. Not on oxygen at home normally. 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   PT Comments   Patient progressing with ambulation in room today.  Still coughing throughout and limited with tolerance with SpO2 down to 88% on room air, back up to 92% after 1 min seated rest.  Follow Up Recommendations  Home health PT;Supervision/Assistance - 24 hour     Does the patient have the potential to tolerate intense rehabilitation   N/A  Barriers to Discharge  None      Equipment Recommendations  None recommended by PT    Recommendations for Other Services  None  Frequency Min 3X/week   Progress towards PT Goals Progress towards PT goals: Progressing toward goals  Plan Current plan remains appropriate    Precautions / Restrictions Precautions Precautions: Fall   Pertinent Vitals/Pain Min pain left arm with movement, and right due to IV infiltrate    Mobility  Bed Mobility Overal bed mobility: Needs Assistance Supine to sit: +2 for physical assistance;Mod assist General bed mobility comments: assist to move feet off bed and lift trunk upright Transfers Equipment used: 1 person hand held assist Transfers: Sit to/from Stand Sit to Stand: Mod assist;+2 safety/equipment Ambulation/Gait Ambulation/Gait assistance: +2 safety/equipment;Mod assist Ambulation Distance (Feet): 10 Feet Assistive device: 1 person hand held assist Gait Pattern/deviations: Step-to  pattern;Steppage;Trunk flexed;Decreased dorsiflexion - left    Exercises General Exercises - Upper Extremity Shoulder Flexion: PROM;Left;AAROM;10 reps;Supine Elbow Flexion: AAROM;Left;10 reps;Supine Wrist Flexion: AAROM;10 reps;Left;Supine General Exercises - Lower Extremity Heel Slides: AAROM;Left;10 reps;Supine     PT Goals (current goals can now be found in the care plan section)    Visit Information  Last PT Received On: 02/07/13 Assistance Needed: +2 History of Present Illness: 80 yo male comes in with 4 days of cough with worse sob and fever.  No sick contacts. Lives at home with family and has not had any recent hospitalizations. Pt reports he does cough frequently when eating or drinking. Not on oxygen at home normally. 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    Subjective Data      Cognition  Cognition Arousal/Alertness: Awake/alert Behavior During Therapy: WFL for tasks assessed/performed Overall Cognitive Status: Within Functional Limits for tasks assessed    Balance  Balance Overall balance assessment: Needs assistance Sitting-balance support: Feet supported;No upper extremity supported Sitting balance-Leahy Scale: Fair Postural control: Left lateral lean Standing balance support: Single extremity supported Standing balance-Leahy Scale: Poor Standing balance comment: leaning back and to left  End of Session PT - End of Session Equipment Utilized During Treatment: Gait belt Activity Tolerance: Patient limited by fatigue Patient left: in chair;with call bell/phone within reach;with chair alarm set   GP     [facility] 02/07/2013, 3:05 PM Aaron Johnson, [address] 02/07/2013

## 2013-02-07 NOTE — Progress Notes (Signed)
Patient's temperature 100.4, family concerned.  Spoke with Dr. Isidoro Donning. Tylenol ordered prn.  Will continue to monitor.

## 2013-02-07 NOTE — Progress Notes (Signed)
Subjective: No new problems overnight, patient did have a low-grade temperature. Current temperature 98.9. According to nurse acetaminophen not administered. Influenza swab negative, UA unremarkable,  Objective: Vital signs in last 24 hours: Temp:  [98.1 F (36.7 C)-100.4 F (38 C)] 98.9 F (37.2 C) (02/04 0531) Pulse Rate:  [79-98] 98 (02/04 0531) Resp:  [18-20] 18 (02/04 0531) BP: (117-139)/(33-75) 120/67 mmHg (02/04 0531) SpO2:  [94 %-99 %] 94 % (02/04 0531) Weight:  [65.772 kg (145 lb)] 65.772 kg (145 lb) (02/03 1920) Weight change: -0.318 kg (-11.2 oz) Last BM Date:  (unknown)  Intake/Output from previous day: 02/03 0701 - 02/04 0700 In: 120 [P.O.:120] Out: 100 [Urine:100] Intake/Output this shift:    General appearance: alert and cooperative Resp: moderate air movement bilateral minimal rhonchi noted in the base Cardio: regular rate and rhythm Extremities: patient does have some swelling right upper extremity, IV infiltrated. Neurologic: Motor: dysarthria, hemiparesis on the left   Lab Results:  Results for orders placed during the hospital encounter of 02/06/13 (from the past 24 hour(s))  URINALYSIS, ROUTINE W REFLEX MICROSCOPIC     Status: Abnormal   Collection Time    02/07/13  4:46 AM      Result Value Range   Color, Urine YELLOW  YELLOW   APPearance CLOUDY (*) CLEAR   Specific Gravity, Urine 1.025  1.005 - 1.030   pH 6.0  5.0 - 8.0   Glucose, UA NEGATIVE  NEGATIVE mg/dL   Hgb urine dipstick SMALL (*) NEGATIVE   Bilirubin Urine NEGATIVE  NEGATIVE   Ketones, ur NEGATIVE  NEGATIVE mg/dL   Protein, ur 100 (*) NEGATIVE mg/dL   Urobilinogen, UA 1.0  0.0 - 1.0 mg/dL   Nitrite NEGATIVE  NEGATIVE   Leukocytes, UA NEGATIVE  NEGATIVE  STREP PNEUMONIAE URINARY ANTIGEN     Status: None   Collection Time    02/07/13  4:46 AM      Result Value Range   Strep Pneumo Urinary Antigen NEGATIVE  NEGATIVE  URINE MICROSCOPIC-ADD ON     Status: None   Collection Time   02/07/13  4:46 AM      Result Value Range   WBC, UA 0-2  <3 WBC/hpf   RBC / HPF 3-6  <3 RBC/hpf   Bacteria, UA RARE  RARE   Urine-Other MUCOUS PRESENT        Studies/Results: Dg Chest 2 View  02/06/2013   CLINICAL DATA:  Shortness of breath.  Coughing up sputum.  Fever.  EXAM: CHEST  2 VIEW  COMPARISON:  10/14/2012  FINDINGS: Streaky lower lung opacities. Lung volumes are mildly low. Chronic cardiomegaly, status post aortic valve replacement. Mediastinal contours distorted by rightward rotation. No effusion or pneumothorax. No pulmonary edema.  IMPRESSION: Bibasilar opacities, morphology favoring atelectasis over pneumonia.   Electronically Signed   By: Jorje Guild M.D.   On: 02/06/2013 03:08    Medications:  Prior to Admission:  Prescriptions prior to admission  Medication Sig Dispense Refill  . albuterol-ipratropium (COMBIVENT) 18-103 MCG/ACT inhaler Inhale 1 puff into the lungs every 4 (four) hours.      Marland Kitchen aspirin 81 MG tablet Take 81 mg by mouth daily.      Marland Kitchen atorvastatin (LIPITOR) 40 MG tablet Take 40 mg by mouth at bedtime.       . brimonidine (ALPHAGAN P) 0.1 % SOLN Place 1 drop into both eyes 2 (two) times daily.      . clopidogrel (PLAVIX) 75 MG tablet Take 75 mg  by mouth daily.      . dorzolamide (TRUSOPT) 2 % ophthalmic solution Place 1 drop into both eyes 3 (three) times daily.      . Fluticasone-Salmeterol (ADVAIR DISKUS) 250-50 MCG/DOSE AEPB Inhale 2 puffs into the lungs daily.       . furosemide (LASIX) 20 MG tablet Take 20 mg by mouth daily as needed. For edema      . losartan (COZAAR) 25 MG tablet Take 25 mg by mouth daily.      . Multiple Vitamin (MULTIVITAMIN) tablet Take 1 tablet by mouth daily.        Marland Kitchen oxyCODONE-acetaminophen (PERCOCET) 5-325 MG per tablet Take 0.5-1 tablets by mouth every 4 (four) hours as needed. For pain      . potassium chloride (KLOR-CON) 20 MEQ packet Take 20 mEq by mouth daily as needed (for edema).      . senna-docusate (SENOKOT-S)  8.6-50 MG per tablet Take 1 tablet by mouth at bedtime as needed for mild constipation.       Scheduled: . aspirin  81 mg Oral Daily  . atorvastatin  40 mg Oral QHS  . azithromycin  500 mg Intravenous Q24H  . cefTRIAXone (ROCEPHIN)  IV  1 g Intravenous Q24H  . clopidogrel  75 mg Oral Daily  . dorzolamide  1 drop Both Eyes TID  . enoxaparin (LOVENOX) injection  40 mg Subcutaneous Q24H  . ipratropium-albuterol  3 mL Nebulization Q4H  . losartan  25 mg Oral Daily  . mometasone-formoterol  2 puff Inhalation BID  . potassium chloride  20 mEq Oral Daily  . sodium chloride  3 mL Intravenous Q12H   Continuous:  PXT:GGYIRS chloride, acetaminophen, oxyCODONE-acetaminophen, senna-docusate, sodium chloride  Assessment/Plan: Fever and a cough an elderly male. Chest x-ray initially showed atelectasis versus infiltrate.we will obtain followup chest x-ray today. Continue antibiotic to cover potential community-acquired pneumonia. Flu swab negative , UA unremarkable Followup blood culture to date are negative.   History of CVA with left hemiparesis as well as dysarthria and dysphasia. Continue dysphagia one diet, pured with thin liquids home health speech therapy recommended Status post aVR  Hyperlipidemia  Hypertension  History of some chronic low back pain   LOS: 1 day   Reubin Bushnell D 02/07/2013, 9:00 AM

## 2013-02-07 NOTE — Procedures (Signed)
Objective Swallowing Evaluation: Modified Barium Swallowing Study  Patient Details  Name: Aaron Johnson MRN: [id number] Date of Birth: [birth year]  Today's Date: 02/07/2013 Time: 0931-1000 SLP Time Calculation (min): 29 min  Past Medical History:  Past Medical History  Diagnosis Date  . Acquired musculoskeletal deformity of unspecified site   . Chronic diastolic heart failure     EF >= 50%  . HTN (hypertension), benign     systematic  . Pure hypercholesterolemia   . COPD (chronic obstructive pulmonary disease)   . Benign neoplasm of colon   . 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 Iron deficiency anemia, unspecified   . LFTs abnormal 3/06    LFTs wnl, Cr - 1.3 stable. 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 colonoscopy 3/05- adenomatous polyps, mild gastritis-diffuse interosseous skeletal hypertosis (DISH), h/o H. pylori s/p tx.  . Stroke   . Hemiparesis, left   . Aortic valve insufficiency   . CHF (congestive heart failure)    Past Surgical History:  Past Surgical History  Procedure Laterality Date  . Cardiac catheterization  1/99    mild CAD; EF 35-40%  . Aortic valve replacement      with pericardial tissue valve  . Cardiac valve replacement    . Multiple tooth extractions     HPI:  80 yo male h/o cva with left sided hemiparesis, htn, chronic diastolic chf, AVR comes in with 4 days of cough with worse sob today.  Pt denies any fevers at home, but febrile here in ED.  He is incontinent of bowel and bladder from previous cva.  Has not noticed any foul smell to urine.  No n/v/d.  No abd pain.  No rashes.  No sick contacts.  Lives at home with family and has not had any recent hospitalizations.  Pt reports he does cough frequently when eating or drinking.  Not on oxygen at home normally.  No le edema or swelling.  No mental status changes.  Pt able to answer all  his own questions.02/06/2013   CLINICAL DATA:  Shortness of breath.  Coughing up sputum.  Fever.  EXAM: CHEST  2 VIEW  COMPARISON:  10/14/2012  FINDINGS: Streaky lower lung opacities. Lung volumes are mildly low. Chronic cardiomegaly, status post aortic valve replacement. Mediastinal contours distorted by rightward rotation. No effusion or pneumothorax. No pulmonary edema.  IMPRESSION: Bibasilar opacities, morphology favoring atelectasis over pneumonia.   Electronically Signed   By: Jorje Guild      Assessment / Plan / Recommendation Clinical Impression  Dysphagia Diagnosis: Moderate pharyngeal phase dysphagia;Moderate oral phase dysphagia Clinical impression: Pt presents with function consistent with findings on last MBS. There is discoordinated oral propulsion of boluses with preature spillage and oral residual transited in a pt initiated second swallow. Mastication of any textured solid is quite prolonged. Primary problem is a delay in swallow initiation with significant pooling of liquids in pts very deep pyrifoms. Even when he is givein large consecutive sips, there was only one instance of trace penetration to the airway. Primary precaution sould be upright posture with all PO intake. Continue to recommend dys 1 (puree) and thin liquids with small to normal sips, but not large consecutive sips. Must be fully upright. SLP will f/u x1 to implement precautions.     Treatment Recommendation  Therapy as outlined in treatment plan below    Diet  Recommendation Dysphagia 1 (Puree);Thin liquid   Liquid Administration via: Cup;Straw Medication Administration: Whole meds with puree Supervision: Full supervision/cueing for compensatory strategies;Trained caregiver to feed patient Compensations: Multiple dry swallows after each bite/sip;Slow rate;Small sips/bites Postural Changes and/or Swallow Maneuvers: Seated upright 90 degrees    Other  Recommendations Oral Care Recommendations: Oral care BID    Follow Up Recommendations  None    Frequency and Duration min 1 x/week  2 weeks   Pertinent Vitals/Pain NA    SLP Swallow Goals     General HPI: 80 yo male h/o cva with left sided hemiparesis, htn, chronic diastolic chf, AVR comes in with 4 days of cough with worse sob today.  Pt denies any fevers at home, but febrile here in ED.  He is incontinent of bowel and bladder from previous cva.  Has not noticed any foul smell to urine.  No n/v/d.  No abd pain.  No rashes.  No sick contacts.  Lives at home with family and has not had any recent hospitalizations.  Pt reports he does cough frequently when eating or drinking.  Not on oxygen at home normally.  No le edema or swelling.  No mental status changes.  Pt able to answer all his own questions.02/06/2013   CLINICAL DATA:  Shortness of breath.  Coughing up sputum.  Fever.  EXAM: CHEST  2 VIEW  COMPARISON:  10/14/2012  FINDINGS: Streaky lower lung opacities. Lung volumes are mildly low. Chronic cardiomegaly, status post aortic valve replacement. Mediastinal contours distorted by rightward rotation. No effusion or pneumothorax. No pulmonary edema.  IMPRESSION: Bibasilar opacities, morphology favoring atelectasis over pneumonia.   Electronically Signed   By: Jorje Guild  Type of Study: Modified Barium Swallowing Study Reason for Referral: Objectively evaluate swallowing function Previous Swallow Assessment: MBSS 10/14/12 revealed moderate oral dysphagia and mild phraryngeal and cervical dysphagia. Pooling in pharynx, recommend Dys2, thin with multiple dry swallows, effortful swallow and small bites, slow rate Diet Prior to this Study: Dysphagia 1 (puree);Thin liquids Temperature Spikes Noted: No Respiratory Status: Nasal cannula History of Recent Intubation: No Behavior/Cognition: Alert;Confused;Requires cueing Oral Cavity - Dentition: Edentulous Oral Motor / Sensory Function: Impaired - see Bedside swallow eval Self-Feeding Abilities: Able to  feed self;Needs assist Patient Positioning: Upright in chair Baseline Vocal Quality: Clear Volitional Cough: Strong Volitional Swallow: Able to elicit Anatomy:  (Curvature of cervical spine, apperance of osteophyte ) Pharyngeal Secretions: Not observed secondary MBS    Reason for Referral Objectively evaluate swallowing function   Oral Phase Oral Preparation/Oral Phase Oral Phase: Impaired Oral - Thin Oral - Thin Cup: Reduced posterior propulsion;Delayed oral transit Oral - Thin Straw: Within functional limits Oral - Solids Oral - Puree: Delayed oral transit;Lingual pumping Oral - Mechanical Soft: Reduced posterior propulsion;Delayed oral transit;Lingual/palatal residue;Impaired mastication (prolonged mastication) Oral - Pill: Delayed oral transit   Pharyngeal Phase Pharyngeal Phase Pharyngeal Phase: Impaired Pharyngeal - Thin Pharyngeal - Thin Cup: Delayed swallow initiation;Premature spillage to pyriform sinuses;Pharyngeal residue - pyriform sinuses (pt initiate second swallow to clear bolus) Pharyngeal - Thin Straw: Delayed swallow initiation;Penetration/Aspiration before swallow Penetration/Aspiration details (thin straw): Material enters airway, remains ABOVE vocal cords then ejected out Pharyngeal - Solids Pharyngeal - Puree: Delayed swallow initiation Pharyngeal - Mechanical Soft: Delayed swallow initiation Pharyngeal - Pill: Delayed swallow initiation  Cervical Esophageal Phase    GO    Cervical Esophageal Phase Cervical Esophageal Phase: [facility]        Herbie Baltimore, MA CCC-SLP [phone]  Makinley Muscato, Katherene Ponto  02/07/2013, 11:00 AM

## 2013-02-08 MED ORDER — LEVOFLOXACIN 500 MG PO TABS: 500.0000 mg | ORAL_TABLET | Freq: Every day | ORAL | Status: DC

## 2013-02-08 NOTE — Discharge Summary (Addendum)
Physician Discharge Summary  Patient ID: Aaron Johnson MRN: [id number] DOB/AGE: 1926-11-15 80 y.o.  Admit date: 02/06/2013 Discharge date: 02/08/2013  Admission Diagnoses:  Discharge Diagnoses:  Principal Problem:   Fever Active Problems:   HYPERTENSION, BENIGN SYSTEMIC   CHF, EJECTION FRACTION > OR = 50%   COPD   CEREBROVAS DIS, LATE EFFECTS (S/P CVA)   Left hemiparesis   PNA (pneumonia)   Discharged Condition: stable  Hospital Course:  Patient presented to the hospital after having cough and fever times one day at home. In emergency room he was evaluated, chest x-ray showed atelectasis versus infiltrate. He did have a temperature. Admission was deemed necessary for further evaluation and treatment. Patient was started empirically on antibiotics for community-acquired pneumonia. Also started on Tamiflu. Influenza studies were negative, UA was unremarkable, blood cultures to date are negative. Strep pneumonia antigen was negative as well as Legionella antigen.Patient episodically had a low-grade temperature but appeared to be at his baseline. He was seen by speech therapy, dysphagia one diet with thin liquids recommended. At this time patient will continue course of antibiotics for probable bronchitis versus community-acquired pneumonia.  Consults:    Significant Diagnostic Studies:Dg Chest 2 View  02/07/2013   CLINICAL DATA:  Cough, shortness of breath  EXAM: CHEST  2 VIEW  COMPARISON:  DG CHEST 2 VIEW dated 02/06/2013; DG CHEST 1V PORT dated 06/23/2011  FINDINGS: There are bilateral chronic bronchitic changes. There is no focal parenchymal opacity, pleural effusion, or pneumothorax. The heart and mediastinal contours are unremarkable. Prior median sternotomy.  Right glenohumeral joint osteoarthritis.  IMPRESSION: There are mild bilateral chronic bronchitic changes.   Electronically Signed   By: Kathreen Devoid   On: 02/07/2013 10:10   Dg Chest 2 View  02/06/2013   CLINICAL DATA:  Shortness of  breath.  Coughing up sputum.  Fever.  EXAM: CHEST  2 VIEW  COMPARISON:  10/14/2012  FINDINGS: Streaky lower lung opacities. Lung volumes are mildly low. Chronic cardiomegaly, status post aortic valve replacement. Mediastinal contours distorted by rightward rotation. No effusion or pneumothorax. No pulmonary edema.  IMPRESSION: Bibasilar opacities, morphology favoring atelectasis over pneumonia.   Electronically Signed   By: Jorje Guild M.D.   On: 02/06/2013 03:08   Ct Head Wo Contrast  01/10/2013   CLINICAL DATA:  Stroke. Cerebral artery occlusion. Left arm weakness  EXAM: CT HEAD WITHOUT CONTRAST  TECHNIQUE: Contiguous axial images were obtained from the base of the skull through the vertex without intravenous contrast.  COMPARISON:  CT 10/13/2012  FINDINGS: Chronic right MCA infarct involving the right frontal and temporal lobe and right basal ganglia. This is unchanged. There is enlargement of the right lateral ventricle due to volume loss from chronic infarct. Chronic microvascular ischemic changes in the white matter. Chronic lacunar infarct in the left thalamus.  Negative for acute infarct.  Negative for hemorrhage or mass lesion.  Calvarium is intact. Atherosclerotic calcification in the carotid artery bilaterally.  IMPRESSION: Atrophy and chronic ischemia.  Chronic right MCA infarct.  No acute abnormality.   Electronically Signed   By: Franchot Gallo M.D.   On: 01/10/2013 15:00   Dg Swallowing Func-speech Pathology  02/07/2013   Katherene Ponto Deblois, CCC-SLP     02/07/2013 11:12 AM Objective Swallowing Evaluation: Modified Barium Swallowing Study   Patient Details  Name: Aaron Johnson MRN: [id number] Date of Birth: 10-27-1926  Today's Date: 02/07/2013 Time: 0931-1000 SLP Time Calculation (min): 29 min  Past Medical History:  Past Medical  History  Diagnosis Date  . Acquired musculoskeletal deformity of unspecified site   . Chronic diastolic heart failure     EF >= 50%  . HTN (hypertension), benign      systematic  . Pure hypercholesterolemia   . COPD (chronic obstructive pulmonary disease)   . Benign neoplasm of colon   . 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 Iron deficiency anemia, unspecified   . LFTs abnormal 3/06    LFTs wnl, Cr - 1.3 stable. 4/06 likely TIA; pt may also have  DISH syndrome ( thick c vertebrae), ataxic gait 2/2 to L  hemiparesis, bx of scalp lesions=blue nevus (benign), 6/04.  colonoscopy 3/05- adenomatous polyps, mild gastritis-diffuse  interosseous skeletal hypertosis (DISH), h/o H. pylori s/p tx.  . Stroke   . Hemiparesis, left   . Aortic valve insufficiency   . CHF (congestive heart failure)    Past Surgical History:  Past Surgical History  Procedure Laterality Date  . Cardiac catheterization  1/99    mild CAD; EF 35-40%  . Aortic valve replacement      with pericardial tissue valve  . Cardiac valve replacement    . Multiple tooth extractions     HPI:  80 yo male h/o cva with left sided hemiparesis, htn, chronic  diastolic chf, AVR comes in with 4 days of cough with worse sob  today.  Pt denies any fevers at home, but febrile here in ED.  He  is incontinent of bowel and bladder from previous cva.  Has not  noticed any foul smell to urine.  No n/v/d.  No abd pain.  No  rashes.  No sick contacts.  Lives at home with family and has not  had any recent hospitalizations.  Pt reports he does cough  frequently when eating or drinking.  Not on oxygen at home  normally.  No le edema or swelling.  No mental status changes.   Pt able to answer all his own questions.02/06/2013   CLINICAL DATA:   Shortness of breath.  Coughing up sputum.  Fever.  EXAM: CHEST   2 VIEW  COMPARISON:  10/14/2012  FINDINGS: Streaky lower lung  opacities. Lung volumes are mildly low. Chronic cardiomegaly,  status post aortic valve replacement. Mediastinal contours  distorted by rightward rotation. No effusion or pneumothorax. No  pulmonary edema.  IMPRESSION: Bibasilar opacities, morphology  favoring atelectasis  over pneumonia.   Electronically Signed    By: Jorje Guild      Assessment / Plan / Recommendation Clinical Impression  Dysphagia Diagnosis: Moderate pharyngeal phase  dysphagia;Moderate oral phase dysphagia Clinical impression: Pt presents with function consistent with  findings on last MBS. There is discoordinated oral propulsion of  boluses with preature spillage and oral residual transited in a  pt initiated second swallow. Mastication of any textured solid is  quite prolonged. Primary problem is a delay in swallow initiation  with significant pooling of liquids in pts very deep pyrifoms.  Even when he is givein large consecutive sips, there was only one  instance of trace penetration to the airway. Primary precaution  sould be upright posture with all PO intake. Continue to  recommend dys 1 (puree) and thin liquids with small to normal  sips, but not large consecutive sips. Must be fully upright. SLP  will f/u x1 to implement precautions.     Treatment Recommendation  Therapy as outlined in treatment plan below    Diet Recommendation Dysphagia 1 (Puree);Thin liquid  Liquid Administration via: Cup;Straw Medication Administration: Whole meds with puree Supervision: Full supervision/cueing for compensatory  strategies;Trained caregiver to feed patient Compensations: Multiple dry swallows after each bite/sip;Slow  rate;Small sips/bites Postural Changes and/or Swallow Maneuvers: Seated upright 90  degrees    Other  Recommendations Oral Care Recommendations: Oral care BID   Follow Up Recommendations  None    Frequency and Duration min 1 x/week  2 weeks   Pertinent Vitals/Pain NA    SLP Swallow Goals     General HPI: 80 yo male h/o cva with left sided hemiparesis, htn,  chronic diastolic chf, AVR comes in with 4 days of cough with  worse sob today.  Pt denies any fevers at home, but febrile here  in ED.  He is incontinent of bowel and bladder from previous cva.   Has not noticed any foul smell to urine.  No  n/v/d.  No abd  pain.  No rashes.  No sick contacts.  Lives at home with family  and has not had any recent hospitalizations.  Pt reports he does  cough frequently when eating or drinking.  Not on oxygen at home  normally.  No le edema or swelling.  No mental status changes.   Pt able to answer all his own questions.02/06/2013   CLINICAL DATA:   Shortness of breath.  Coughing up sputum.  Fever.  EXAM: CHEST   2 VIEW  COMPARISON:  10/14/2012  FINDINGS: Streaky lower lung  opacities. Lung volumes are mildly low. Chronic cardiomegaly,  status post aortic valve replacement. Mediastinal contours  distorted by rightward rotation. No effusion or pneumothorax. No  pulmonary edema.  IMPRESSION: Bibasilar opacities, morphology  favoring atelectasis over pneumonia.   Electronically Signed    By: Jorje Guild  Type of Study: Modified Barium Swallowing Study Reason for Referral: Objectively evaluate swallowing function Previous Swallow Assessment: MBSS 10/14/12 revealed moderate oral  dysphagia and mild phraryngeal and cervical dysphagia. Pooling in  pharynx, recommend Dys2, thin with multiple dry swallows,  effortful swallow and small bites, slow rate Diet Prior to this Study: Dysphagia 1 (puree);Thin liquids Temperature Spikes Noted: No Respiratory Status: Nasal cannula History of Recent Intubation: No Behavior/Cognition: Alert;Confused;Requires cueing Oral Cavity - Dentition: Edentulous Oral Motor / Sensory Function: Impaired - see Bedside swallow  eval Self-Feeding Abilities: Able to feed self;Needs assist Patient Positioning: Upright in chair Baseline Vocal Quality: Clear Volitional Cough: Strong Volitional Swallow: Able to elicit Anatomy:  (Curvature of cervical spine, apperance of osteophyte ) Pharyngeal Secretions: Not observed secondary MBS    Reason for Referral Objectively evaluate swallowing function   Oral Phase Oral Preparation/Oral Phase Oral Phase: Impaired Oral - Thin Oral - Thin Cup: Reduced posterior  propulsion;Delayed oral  transit Oral - Thin Straw: Within functional limits Oral - Solids Oral - Puree: Delayed oral transit;Lingual pumping Oral - Mechanical Soft: Reduced posterior propulsion;Delayed oral  transit;Lingual/palatal residue;Impaired mastication (prolonged  mastication) Oral - Pill: Delayed oral transit   Pharyngeal Phase Pharyngeal Phase Pharyngeal Phase: Impaired Pharyngeal - Thin Pharyngeal - Thin Cup: Delayed swallow initiation;Premature  spillage to pyriform sinuses;Pharyngeal residue - pyriform  sinuses (pt initiate second swallow to clear bolus) Pharyngeal - Thin Straw: Delayed swallow  initiation;Penetration/Aspiration before swallow Penetration/Aspiration details (thin straw): Material enters  airway, remains ABOVE vocal cords then ejected out Pharyngeal - Solids Pharyngeal - Puree: Delayed swallow initiation Pharyngeal - Mechanical Soft: Delayed swallow initiation Pharyngeal - Pill: Delayed swallow initiation  Cervical Esophageal Phase    GO  Cervical Esophageal Phase Cervical Esophageal Phase: [facility]        Herbie [place], MA CCC-SLP [phone]  De[place]is, Katherene Ponto 02/07/2013, 11:00 AM       Discharge Exam: Blood pressure 95/64, pulse 102, temperature 97.6 F (36.4 C), temperature source Oral, resp. rate 18, height 5\' 6"  (1.676 m), weight 70.852 kg (156 lb 3.2 oz), SpO2 94.00%. General appearance: alert and cooperative Resp: clear to auscultation bilaterally Cardio: regular rate and rhythm, systolic murmur Neurologic: Motor: left hemiparesis unchanged  Disposition: 06-Home-Health Care Svc   Future Appointments Provider Department Dept Phone   06/18/2013 1:30 PM Philmore Pali, NP [facility] [phone]       Medication List         ADVAIR DISKUS 250-50 MCG/DOSE Aepb  Generic drug:  Fluticasone-Salmeterol  Inhale 2 puffs into the lungs daily.     albuterol-ipratropium 18-103 MCG/ACT inhaler  Commonly known as:  COMBIVENT  Inhale 1 puff into the  lungs every 4 (four) hours.     aspirin 81 MG tablet  Take 81 mg by mouth daily.     atorvastatin 40 MG tablet  Commonly known as:  LIPITOR  Take 40 mg by mouth at bedtime.     brimonidine 0.1 % Soln  Commonly known as:  ALPHAGAN P  Place 1 drop into both eyes 2 (two) times daily.     clopidogrel 75 MG tablet  Commonly known as:  PLAVIX  Take 75 mg by mouth daily.     dorzolamide 2 % ophthalmic solution  Commonly known as:  TRUSOPT  Place 1 drop into both eyes 3 (three) times daily.     furosemide 20 MG tablet  Commonly known as:  LASIX  Take 20 mg by mouth daily as needed. For edema     levofloxacin 500 MG tablet  Commonly known as:  LEVAQUIN  Take 1 tablet (500 mg total) by mouth daily.     losartan 25 MG tablet  Commonly known as:  COZAAR  Take 25 mg by mouth daily.     multivitamin tablet  Take 1 tablet by mouth daily.     oxyCODONE-acetaminophen 5-325 MG per tablet  Commonly known as:  PERCOCET/ROXICET  Take 0.5-1 tablets by mouth every 4 (four) hours as needed. For pain     potassium chloride 20 MEQ packet  Commonly known as:  KLOR-CON  Take 20 mEq by mouth daily as needed (for edema).     senna-docusate 8.6-50 MG per tablet  Commonly known as:  Senokot-S  Take 1 tablet by mouth at bedtime as needed for mild constipation.           Follow-up Information   Follow up with Khara Renaud D, MD In 1 week.   Specialty:  Internal Medicine   Contact information:   [address] [place] [address] [phone]      35 minutes were spent in the discharge process of this patient. Reviewing labs tests and followup with her daughter, medication reconciliation, dictation of summary. SignedKandice Hams 02/08/2013, 12:25 PM

## 2013-02-12 LAB — CULTURE, BLOOD (ROUTINE X 2)
Culture: NO GROWTH
Culture: NO GROWTH

## 2013-06-18 ENCOUNTER — Encounter: Payer: Self-pay | Admitting: Nurse Practitioner

## 2013-06-18 ENCOUNTER — Ambulatory Visit: Payer: Medicare Other | Admitting: Nurse Practitioner

## 2013-06-18 NOTE — Progress Notes (Signed)
Subjective:    Patient ID: Aaron Johnson is a 80 y.o. male.  HPI Patient was no show for today's office appointment.   Review of Systems  Objective:  Neurologic Exam  Physical Exam  Assessment:   No show  Plan:  No show

## 2013-08-02 ENCOUNTER — Inpatient Hospital Stay (HOSPITAL_COMMUNITY): Payer: PRIVATE HEALTH INSURANCE

## 2013-08-02 ENCOUNTER — Emergency Department (HOSPITAL_COMMUNITY): Payer: PRIVATE HEALTH INSURANCE

## 2013-08-02 ENCOUNTER — Observation Stay (HOSPITAL_COMMUNITY)
Admission: EM | Admit: 2013-08-02 | Discharge: 2013-08-04 | Disposition: A | Discharge status: Home / Self Care | Payer: PRIVATE HEALTH INSURANCE | Attending: Internal Medicine | Admitting: Internal Medicine

## 2013-08-02 ENCOUNTER — Encounter (HOSPITAL_COMMUNITY): Payer: Self-pay | Admitting: Emergency Medicine

## 2013-08-02 DIAGNOSIS — M959 Acquired deformity of musculoskeletal system, unspecified: Secondary | ICD-10-CM

## 2013-08-02 DIAGNOSIS — I69959 Hemiplegia and hemiparesis following unspecified cerebrovascular disease affecting unspecified side: Secondary | ICD-10-CM | POA: Diagnosis not present

## 2013-08-02 DIAGNOSIS — D126 Benign neoplasm of colon, unspecified: Secondary | ICD-10-CM

## 2013-08-02 DIAGNOSIS — I5032 Chronic diastolic (congestive) heart failure: Secondary | ICD-10-CM | POA: Insufficient documentation

## 2013-08-02 DIAGNOSIS — I359 Nonrheumatic aortic valve disorder, unspecified: Secondary | ICD-10-CM

## 2013-08-02 DIAGNOSIS — E785 Hyperlipidemia, unspecified: Secondary | ICD-10-CM | POA: Insufficient documentation

## 2013-08-02 DIAGNOSIS — Z7901 Long term (current) use of anticoagulants: Secondary | ICD-10-CM | POA: Insufficient documentation

## 2013-08-02 DIAGNOSIS — Z79899 Other long term (current) drug therapy: Secondary | ICD-10-CM | POA: Insufficient documentation

## 2013-08-02 DIAGNOSIS — Z952 Presence of prosthetic heart valve: Secondary | ICD-10-CM | POA: Insufficient documentation

## 2013-08-02 DIAGNOSIS — J4489 Other specified chronic obstructive pulmonary disease: Secondary | ICD-10-CM | POA: Insufficient documentation

## 2013-08-02 DIAGNOSIS — Z87891 Personal history of nicotine dependence: Secondary | ICD-10-CM | POA: Insufficient documentation

## 2013-08-02 DIAGNOSIS — I10 Essential (primary) hypertension: Secondary | ICD-10-CM | POA: Insufficient documentation

## 2013-08-02 DIAGNOSIS — G9340 Encephalopathy, unspecified: Principal | ICD-10-CM | POA: Insufficient documentation

## 2013-08-02 DIAGNOSIS — Z954 Presence of other heart-valve replacement: Secondary | ICD-10-CM

## 2013-08-02 DIAGNOSIS — E7800 Pure hypercholesterolemia, unspecified: Secondary | ICD-10-CM | POA: Insufficient documentation

## 2013-08-02 DIAGNOSIS — J449 Chronic obstructive pulmonary disease, unspecified: Secondary | ICD-10-CM | POA: Diagnosis not present

## 2013-08-02 DIAGNOSIS — Z7982 Long term (current) use of aspirin: Secondary | ICD-10-CM | POA: Insufficient documentation

## 2013-08-02 DIAGNOSIS — I2510 Atherosclerotic heart disease of native coronary artery without angina pectoris: Secondary | ICD-10-CM

## 2013-08-02 DIAGNOSIS — I509 Heart failure, unspecified: Secondary | ICD-10-CM | POA: Insufficient documentation

## 2013-08-02 DIAGNOSIS — G8194 Hemiplegia, unspecified affecting left nondominant side: Secondary | ICD-10-CM

## 2013-08-02 DIAGNOSIS — T783XXA Angioneurotic edema, initial encounter: Secondary | ICD-10-CM

## 2013-08-02 DIAGNOSIS — D509 Iron deficiency anemia, unspecified: Secondary | ICD-10-CM

## 2013-08-02 DIAGNOSIS — F29 Unspecified psychosis not due to a substance or known physiological condition: Secondary | ICD-10-CM | POA: Diagnosis present

## 2013-08-02 DIAGNOSIS — G459 Transient cerebral ischemic attack, unspecified: Secondary | ICD-10-CM | POA: Diagnosis present

## 2013-08-02 DIAGNOSIS — Z86010 Personal history of colonic polyps: Secondary | ICD-10-CM

## 2013-08-02 HISTORY — DX: Unspecified visual loss: H54.7

## 2013-08-02 LAB — DIFFERENTIAL
BASOS ABS: 0 10*3/uL (ref 0.0–0.1)
Basophils Relative: 1 % (ref 0–1)
Eosinophils Absolute: 0.2 10*3/uL (ref 0.0–0.7)
Eosinophils Relative: 3 % (ref 0–5)
LYMPHS PCT: 32 % (ref 12–46)
Lymphs Abs: 1.7 10*3/uL (ref 0.7–4.0)
Monocytes Absolute: 0.6 10*3/uL (ref 0.1–1.0)
Monocytes Relative: 11 % (ref 3–12)
NEUTROS PCT: 54 % (ref 43–77)
Neutro Abs: 2.9 10*3/uL (ref 1.7–7.7)

## 2013-08-02 LAB — PHOSPHORUS: Phosphorus: 2.6 mg/dL (ref 2.3–4.6)

## 2013-08-02 LAB — RAPID URINE DRUG SCREEN, HOSP PERFORMED
Amphetamines: NOT DETECTED
BARBITURATES: NOT DETECTED
Benzodiazepines: NOT DETECTED
Cocaine: NOT DETECTED
Opiates: NOT DETECTED
Tetrahydrocannabinol: NOT DETECTED

## 2013-08-02 LAB — GLUCOSE, CAPILLARY
GLUCOSE-CAPILLARY: 135 mg/dL — AB (ref 70–99)
Glucose-Capillary: 101 mg/dL — ABNORMAL HIGH (ref 70–99)

## 2013-08-02 LAB — COMPREHENSIVE METABOLIC PANEL
ALT: 15 U/L (ref 0–53)
AST: 18 U/L (ref 0–37)
Albumin: 3.8 g/dL (ref 3.5–5.2)
Alkaline Phosphatase: 72 U/L (ref 39–117)
Anion gap: 12 (ref 5–15)
BUN: 17 mg/dL (ref 6–23)
CO2: 22 meq/L (ref 19–32)
Calcium: 8.8 mg/dL (ref 8.4–10.5)
Chloride: 108 mEq/L (ref 96–112)
Creatinine, Ser: 0.78 mg/dL (ref 0.50–1.35)
GFR calc Af Amer: >90 mL/min (ref >90)
GFR calc non Af Amer: 80 mL/min — ABNORMAL LOW (ref >90)
Glucose, Bld: 85 mg/dL (ref 70–99)
Potassium: 3.6 mEq/L — ABNORMAL LOW (ref 3.7–5.3)
SODIUM: 142 meq/L (ref 137–147)
TOTAL PROTEIN: 7.2 g/dL (ref 6.0–8.3)
Total Bilirubin: 0.7 mg/dL (ref 0.3–1.2)

## 2013-08-02 LAB — CBG MONITORING, ED
GLUCOSE-CAPILLARY: 82 mg/dL (ref 70–99)
Glucose-Capillary: 78 mg/dL (ref 70–99)

## 2013-08-02 LAB — URINALYSIS, ROUTINE W REFLEX MICROSCOPIC
Bilirubin Urine: NEGATIVE
Glucose, UA: NEGATIVE mg/dL
Hgb urine dipstick: NEGATIVE
Ketones, ur: 15 mg/dL — AB
LEUKOCYTES UA: NEGATIVE
Nitrite: NEGATIVE
PH: 5.5 (ref 5.0–8.0)
Protein, ur: NEGATIVE mg/dL
SPECIFIC GRAVITY, URINE: 1.017 (ref 1.005–1.030)
Urobilinogen, UA: 0.2 mg/dL (ref 0.0–1.0)

## 2013-08-02 LAB — I-STAT TROPONIN, ED: Troponin i, poc: 0.01 ng/mL (ref 0.00–0.08)

## 2013-08-02 LAB — CBC
HCT: 33.7 % — ABNORMAL LOW (ref 39.0–52.0)
Hemoglobin: 11 g/dL — ABNORMAL LOW (ref 13.0–17.0)
MCH: 28.3 pg (ref 26.0–34.0)
MCHC: 32.6 g/dL (ref 30.0–36.0)
MCV: 86.6 fL (ref 78.0–100.0)
PLATELETS: 95 10*3/uL — AB (ref 150–400)
RBC: 3.89 MIL/uL — ABNORMAL LOW (ref 4.22–5.81)
RDW: 13.6 % (ref 11.5–15.5)
WBC: 5.5 10*3/uL (ref 4.0–10.5)

## 2013-08-02 LAB — MAGNESIUM: Magnesium: 1.9 mg/dL (ref 1.5–2.5)

## 2013-08-02 LAB — PROTIME-INR
INR: 1.23 (ref 0.00–1.49)
PROTHROMBIN TIME: 15.5 s — AB (ref 11.6–15.2)

## 2013-08-02 LAB — APTT: aPTT: 31 seconds (ref 24–37)

## 2013-08-02 LAB — HEMOGLOBIN A1C
HEMOGLOBIN A1C: 5.1 % (ref <5.7)
MEAN PLASMA GLUCOSE: 100 mg/dL (ref <117)

## 2013-08-02 MED ORDER — ASPIRIN 81 MG PO TABS: 81.0000 mg | ORAL_TABLET | Freq: Every day | ORAL | Status: DC

## 2013-08-02 MED ORDER — SODIUM CHLORIDE 0.9 % IV SOLN
INTRAVENOUS | Status: DC
  Administered 2013-08-02: 16:00:00 via INTRAVENOUS

## 2013-08-02 MED ORDER — ALBUTEROL SULFATE (2.5 MG/3ML) 0.083% IN NEBU: 2.5000 mg | INHALATION_SOLUTION | RESPIRATORY_TRACT | Status: DC | PRN

## 2013-08-02 MED ORDER — BRIMONIDINE TARTRATE 0.2 % OP SOLN
1.0000 [drp] | Freq: Two times a day (BID) | OPHTHALMIC | Status: DC
  Administered 2013-08-02 – 2013-08-04 (×4): 1 [drp] via OPHTHALMIC
  Filled 2013-08-02: qty 5

## 2013-08-02 MED ORDER — DORZOLAMIDE HCL 2 % OP SOLN
1.0000 [drp] | Freq: Three times a day (TID) | OPHTHALMIC | Status: DC
  Administered 2013-08-02 – 2013-08-04 (×5): 1 [drp] via OPHTHALMIC
  Filled 2013-08-02: qty 10

## 2013-08-02 MED ORDER — POTASSIUM CHLORIDE 20 MEQ PO PACK: 20.0000 meq | PACK | Freq: Every day | ORAL | Status: DC | PRN

## 2013-08-02 MED ORDER — SENNOSIDES-DOCUSATE SODIUM 8.6-50 MG PO TABS
1.0000 | ORAL_TABLET | Freq: Every day | ORAL | Status: DC
  Administered 2013-08-02 – 2013-08-04 (×3): 1 via ORAL
  Filled 2013-08-02 (×2): qty 1

## 2013-08-02 MED ORDER — IPRATROPIUM-ALBUTEROL 18-103 MCG/ACT IN AERO: 1.0000 | INHALATION_SPRAY | RESPIRATORY_TRACT | Status: DC | PRN

## 2013-08-02 MED ORDER — ATORVASTATIN CALCIUM 40 MG PO TABS
40.0000 mg | ORAL_TABLET | Freq: Every day | ORAL | Status: DC
  Administered 2013-08-02 – 2013-08-03 (×2): 40 mg via ORAL
  Filled 2013-08-02 (×2): qty 1

## 2013-08-02 MED ORDER — POLYETHYLENE GLYCOL 3350 17 G PO PACK
17.0000 g | PACK | Freq: Every day | ORAL | Status: DC
  Administered 2013-08-02 – 2013-08-04 (×3): 17 g via ORAL
  Filled 2013-08-02 (×3): qty 1

## 2013-08-02 MED ORDER — CLOPIDOGREL BISULFATE 75 MG PO TABS
75.0000 mg | ORAL_TABLET | Freq: Every day | ORAL | Status: DC
  Administered 2013-08-02 – 2013-08-04 (×3): 75 mg via ORAL
  Filled 2013-08-02 (×3): qty 1

## 2013-08-02 MED ORDER — ACETAMINOPHEN 325 MG PO TABS: 650.0000 mg | ORAL_TABLET | ORAL | Status: DC | PRN

## 2013-08-02 MED ORDER — ADULT MULTIVITAMIN W/MINERALS CH
1.0000 | ORAL_TABLET | Freq: Every day | ORAL | Status: DC
  Administered 2013-08-03 – 2013-08-04 (×2): 1 via ORAL
  Filled 2013-08-02 (×2): qty 1

## 2013-08-02 MED ORDER — HEPARIN SODIUM (PORCINE) 5000 UNIT/ML IJ SOLN
5000.0000 [IU] | Freq: Three times a day (TID) | INTRAMUSCULAR | Status: DC
  Administered 2013-08-02 – 2013-08-04 (×6): 5000 [IU] via SUBCUTANEOUS
  Filled 2013-08-02 (×5): qty 1

## 2013-08-02 MED ORDER — OXYCODONE-ACETAMINOPHEN 5-325 MG PO TABS: 0.5000 | ORAL_TABLET | ORAL | Status: DC | PRN

## 2013-08-02 MED ORDER — MOMETASONE FURO-FORMOTEROL FUM 100-5 MCG/ACT IN AERO
2.0000 | INHALATION_SPRAY | Freq: Two times a day (BID) | RESPIRATORY_TRACT | Status: DC
  Filled 2013-08-02: qty 8.8

## 2013-08-02 MED ORDER — STROKE: EARLY STAGES OF RECOVERY BOOK
Freq: Once | Status: AC
  Administered 2013-08-02: 15:00:00
  Filled 2013-08-02: qty 1

## 2013-08-02 MED ORDER — BRIMONIDINE TARTRATE 0.15 % OP SOLN
1.0000 [drp] | Freq: Two times a day (BID) | OPHTHALMIC | Status: DC
  Filled 2013-08-02: qty 5

## 2013-08-02 MED ORDER — ONE-DAILY MULTI VITAMINS PO TABS: 1.0000 | ORAL_TABLET | Freq: Every day | ORAL | Status: DC

## 2013-08-02 MED ORDER — ASPIRIN EC 81 MG PO TBEC
81.0000 mg | DELAYED_RELEASE_TABLET | Freq: Every day | ORAL | Status: DC
  Administered 2013-08-02 – 2013-08-04 (×3): 81 mg via ORAL
  Filled 2013-08-02 (×3): qty 1

## 2013-08-02 MED ORDER — LOSARTAN POTASSIUM 50 MG PO TABS
25.0000 mg | ORAL_TABLET | Freq: Every day | ORAL | Status: DC
  Administered 2013-08-02 – 2013-08-04 (×3): 25 mg via ORAL
  Filled 2013-08-02 (×3): qty 1

## 2013-08-02 MED ORDER — ONDANSETRON HCL 4 MG/2ML IJ SOLN: 4.0000 mg | Freq: Four times a day (QID) | INTRAMUSCULAR | Status: DC | PRN
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
## 2013-08-02 NOTE — ED Provider Notes (Signed)
Medical screening examination/treatment/procedure(s) were conducted as a shared visit with non-physician practitioner(s) and myself.  I personally evaluated the patient during the encounter. Pt presents w/ report of slurred speech and "drawing up" of LUE since waking up this morning. LSN before bed last night. These symptoms have since resolved and pt reports he is back to baseline during my exam. Plan for medical admit for TIA r/o.    EKG Interpretation None       Date: 08/02/2013  Rate: sinus or ectopic atrial rhythm, HR 61  Rhythm: indeterminate  QRS Axis: normal  Intervals: borderline prolonged PR  ST/T Wave abnormalities: nonspecific T wave changes  Conduction Disutrbances:none  Narrative Interpretation:   Old EKG Reviewed: unchanged     Neta Ehlers, MD 08/02/13 [place]

## 2013-08-02 NOTE — ED Provider Notes (Signed)
Medical screening examination/treatment/procedure(s) were conducted as a shared visit with non-physician practitioner(s) and myself.  I personally evaluated the patient during the encounter.   EKG Interpretation None        Neta Ehlers, MD 08/02/13 1940

## 2013-08-02 NOTE — Progress Notes (Signed)
EEG completed; results pending.    

## 2013-08-02 NOTE — Procedures (Signed)
ELECTROENCEPHALOGRAM REPORT   Patient: Aaron Johnson       Room #: 2I94 EEG No. ID: [id number] Age: 80 y.o.        Sex: male Referring Physician: Ree Kida Report Date:  08/02/2013        Interpreting Physician: Alexis Goodell D  History: Aaron Johnson is an 80 y.o. male with new onset episodes of confusion and left arm clonus  Medications:  Scheduled: . aspirin EC  81 mg Oral Daily  . atorvastatin  40 mg Oral QHS  . brimonidine  1 drop Both Eyes BID  . clopidogrel  75 mg Oral Daily  . dorzolamide  1 drop Both Eyes TID  . heparin  5,000 Units Subcutaneous 3 times per day  . losartan  25 mg Oral Daily  . mometasone-formoterol  2 puff Inhalation BID  . multivitamin with minerals  1 tablet Oral Daily  . polyethylene glycol  17 g Oral Daily  . senna-docusate  1 tablet Oral Daily    Conditions of Recording:  This is a 16 channel EEG carried out with the patient in the awake state.  Description:  Artifact is prominent during the entire recording.  Patient does have the episode in question during the recording with left arm clonus and confusion but the background activity is unable to be evaluated due to the predominance of the muscle and movement artifact that is noted.  Could not unequivocally rule out sharp activity or seizure activity.   The patient does not drowse or sleep.   Hyperventilation and intermittent photic stimulation were not performed.    IMPRESSION: This is a technically difficult record due to the prominence of muscle and movement artifact.  Adequate evaluation of the background rhythm could not be performed.     Alexis Goodell, MD [facility] [phone] 08/02/2013, 7:14 PM
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
## 2013-08-02 NOTE — ED Notes (Signed)
Pt in xray

## 2013-08-02 NOTE — H&P (Signed)
PATIENT DETAILS Name: Aaron Johnson Age: 80 y.o. Sex: male Date of Birth: Apr 12, 1926 Admit Date: 08/02/2013 DGL:[id number],PIRJJO D, MD   CHIEF COMPLAINT:  Stroke symptoms with confusion  HPI: Aaron Johnson is a 80 y.o. male with a Past Medical History of prior CVA, left hemiparesis, HTN, and hyperlipidemia who presents today with the above noted complaint. Per patient's daughter, symptoms began early this morning around 2 am when she noticed he was behaving abnormally, "speaking out of his mind, confused where he was" with decreased movements of left extremities compared to baseline and strong contraction of left arm. He was taken to the ED around 11am and according to family he had then returned to baseline. Currently, he is in no pain and not experiencing any new symptoms.Per daughter he is back to his baseline. Patient denied chest pain, SOB, nausea and vomiting.  ALLERGIES:  No Known Allergies  PAST MEDICAL HISTORY: Past Medical History  Diagnosis Date  . Acquired musculoskeletal deformity of unspecified site   . Chronic diastolic heart failure     EF >= 50%  . HTN (hypertension), benign     systematic  . Pure hypercholesterolemia   . COPD (chronic obstructive pulmonary disease)   . Benign neoplasm of colon   . 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 Iron deficiency anemia, unspecified   . LFTs abnormal 3/06    LFTs wnl, Cr - 1.3 stable. 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 colonoscopy 3/05- adenomatous polyps, mild gastritis-diffuse interosseous skeletal hypertosis (DISH), h/o H. pylori s/p tx.  . Stroke   . Hemiparesis, left   . Aortic valve insufficiency   . CHF (congestive heart failure)    PAST SURGICAL HISTORY: Past Surgical History  Procedure Laterality Date  . Cardiac catheterization  1/99    mild CAD; EF 35-40%  . Aortic valve  replacement      with pericardial tissue valve  . Cardiac valve replacement    . Multiple tooth extractions     MEDICATIONS AT HOME: Prior to Admission medications   Medication Sig Start Date End Date Taking? Authorizing Provider  albuterol-ipratropium (COMBIVENT) 18-103 MCG/ACT inhaler Inhale 1 puff into the lungs every 4 (four) hours as needed for wheezing or shortness of breath.    Yes Historical Provider, MD  aspirin 81 MG tablet Take 81 mg by mouth daily.   Yes Historical Provider, MD  atorvastatin (LIPITOR) 40 MG tablet Take 40 mg by mouth at bedtime.    Yes Historical Provider, MD  brimonidine (ALPHAGAN P) 0.1 % SOLN Place 1 drop into both eyes 2 (two) times daily.   Yes Historical Provider, MD  clopidogrel (PLAVIX) 75 MG tablet Take 75 mg by mouth daily.   Yes Historical Provider, MD  dorzolamide (TRUSOPT) 2 % ophthalmic solution Place 1 drop into both eyes 3 (three) times daily.   Yes Historical Provider, MD  Fluticasone-Salmeterol (ADVAIR DISKUS) 250-50 MCG/DOSE AEPB Inhale 2 puffs into the lungs daily.    Yes Historical Provider, MD  furosemide (LASIX) 20 MG tablet Take 20 mg by mouth daily as needed for fluid or edema. For edema   Yes Historical Provider, MD  losartan (COZAAR) 25 MG tablet Take 25 mg by mouth daily.   Yes Historical Provider, MD  Multiple Vitamin (MULTIVITAMIN) tablet Take 1 tablet by mouth daily.  Yes Historical Provider, MD  oxyCODONE-acetaminophen (PERCOCET) 5-325 MG per tablet Take 0.5-1 tablets by mouth every 4 (four) hours as needed for moderate pain.    Yes Historical Provider, MD  polyethylene glycol (MIRALAX / GLYCOLAX) packet Take 17 g by mouth daily.   Yes Historical Provider, MD  potassium chloride (KLOR-CON) 20 MEQ packet Take 20 mEq by mouth daily as needed (for edema when taking lasix).    Yes Historical Provider, MD  senna-docusate (SENOKOT-S) 8.6-50 MG per tablet Take 1 tablet by mouth daily.    Yes Historical Provider, MD    FAMILY  HISTORY: Family History  Problem Relation Age of Onset  . Colon cancer Neg Hx   . Heart disease Father    SOCIAL HISTORY:  reports that he has quit smoking. His smoking use included Cigarettes. He smoked 0.00 packs per day. He has never used smokeless tobacco. He reports that he drinks alcohol. He reports that he does not use illicit drugs.  REVIEW OF SYSTEMS:  Constitutional:  No weight loss, night sweats,  Fevers, chills, fatigue.  HEENT:  No headaches, Difficulty swallowing,  Cardio-vascular:No chest pain, Orthopnea, PND, swelling in lower extremities, anasarca, dizziness, palpitations  GI: No heartburn, indigestion, abdominal pain, nausea, vomiting, diarrhea, change in bowel habits, loss of appetite  Resp:No shortness of breath with exertion or at rest.  No cough or hemoptysis, No wheezing.No chest wall deformity  Skin: no rash or lesions.  GU: no dysuria, change in color of urine, no urgency or frequency.  No flank pain.  Musculoskeletal: No joint pain or swelling.  No decreased range of motion.  No back pain.  Psych: No change in mood or affect. No depression or anxiety.  No memory loss.   PHYSICAL EXAM: Blood pressure 156/71, pulse 62, temperature 98.9 F (37.2 C), temperature source Oral, resp. rate 33, SpO2 95.00%. General appearance :Awake, alert, not in any distress. Speech somewhat unclear. Not toxic Looking HEENT: Atraumatic and Normocephalic, right pupil reactive to light Neck: supple, no JVD. No cervical lymphadenopathy.  Chest:Good air entry bilaterally, no added sounds  CVS: Murmur heard, RRR, no gallops or rubs noted.  Abdomen: Bowel sounds present, Non tender and not distended with no gaurding, rigidity or rebound. Extremities: B/L Lower Ext shows no edema, both legs are warm to touch Neurology: Awake and alert, EOMs in tact (verbally directed), Sensation equal in bilateral extremities, muscle strength decreased on left upper-is chronically contracted and  left lower extremities r/5, right upper and lower ext 5/5.  Skin:No Rash  LABS ON ADMISSION:   Recent Labs  08/02/13 1135  NA 142  K 3.6*  CL 108  CO2 22  GLUCOSE 85  BUN 17  CREATININE 0.78  CALCIUM 8.8    Recent Labs  08/02/13 1135  AST 18  ALT 15  ALKPHOS 72  BILITOT 0.7  PROT 7.2  ALBUMIN 3.8   No results found for this basename: LIPASE, AMYLASE,  in the last 72 hours  Recent Labs  08/02/13 1135  WBC 5.5  NEUTROABS 2.9  HGB 11.0*  HCT 33.7*  MCV 86.6  PLT 95*    RADIOLOGIC STUDIES ON ADMISSION: Ct Head Wo Contrast  08/02/2013   CLINICAL DATA:  Confusion.  Prior stroke.  EXAM: CT HEAD WITHOUT CONTRAST  TECHNIQUE: Contiguous axial images were obtained from the base of the skull through the vertex without intravenous contrast.  COMPARISON:  01/10/2013.  FINDINGS: No intracranial hemorrhage.  Remote large right hemispheric infarct most notable involving the  right frontal lobe and basal ganglia with encephalomalacia and subsequent dilation of the right lateral ventricle and wallerian degeneration.  Remote left thalamic infarct.  No CT evidence of large acute infarct.  Atrophy without hydrocephalus.  No intracranial mass lesion noted on this unenhanced exam.  Vascular calcifications.  Spinal stenosis upper cervical spine.  Minimal mucosal thickening right maxillary sinus.  IMPRESSION: No intracranial hemorrhage.  Remote large right hemispheric infarct most notable involving the right frontal lobe and basal ganglia with encephalomalacia and subsequent dilation of the right lateral ventricle and wallerian degeneration.  Remote left thalamic infarct.  No CT evidence of large acute infarct.  Spinal stenosis upper cervical spine.   Electronically Signed   By: Chauncey Cruel M.D.   On: 08/02/2013 12:42    ASSESSMENT AND PLAN: Present on Admission:  . TIA (transient ischemic attack)  TIA vs CVA -c/w ASA/Plavixm,will initiate further work up with MRI  Brain/Echo/Carotids/EEG -Neuro consulted, await further recommendations  HTN -continue current medication regimen  Dyslipidemia -c/w Statins -check lipid panel  Chronic Diastolic CHF -clinically compensated -continue to monitor closely  Hx of Aortic Valve Replacement  Prior Hx of CVA  -With residual Left sided hemiparesis x25 years  Further plan will depend as patient's clinical course evolves and further radiologic and laboratory data become available. Patient will be monitored closely.  Above noted plan was discussed with patient, son and daughter; they were in agreement.   DVT Prophylaxis: Prophylactic Heparin  Code Status: Full Code  Total time spent for admission equals 45 minutes.  Audelia Acton PA-S [facility] Pager [phone]  If 7PM-7AM, please contact night-coverage [email or web address] Password [email or web address] 08/02/2013, 2:10 PM  **Disclaimer: This note may have been dictated with voice recognition software. Similar sounding words can inadvertently be transcribed and this note may contain transcription errors which may not have been corrected upon publication of note.**

## 2013-08-03 ENCOUNTER — Inpatient Hospital Stay (HOSPITAL_COMMUNITY): Payer: PRIVATE HEALTH INSURANCE

## 2013-08-03 ENCOUNTER — Observation Stay (HOSPITAL_COMMUNITY): Payer: PRIVATE HEALTH INSURANCE

## 2013-08-03 DIAGNOSIS — G8190 Hemiplegia, unspecified affecting unspecified side: Secondary | ICD-10-CM

## 2013-08-03 DIAGNOSIS — I359 Nonrheumatic aortic valve disorder, unspecified: Secondary | ICD-10-CM

## 2013-08-03 DIAGNOSIS — Z954 Presence of other heart-valve replacement: Secondary | ICD-10-CM

## 2013-08-03 DIAGNOSIS — E785 Hyperlipidemia, unspecified: Secondary | ICD-10-CM

## 2013-08-03 DIAGNOSIS — I10 Essential (primary) hypertension: Secondary | ICD-10-CM

## 2013-08-03 DIAGNOSIS — G9340 Encephalopathy, unspecified: Secondary | ICD-10-CM | POA: Diagnosis not present

## 2013-08-03 LAB — LIPID PANEL
Cholesterol: 123 mg/dL (ref 0–200)
HDL: 76 mg/dL (ref >39)
LDL Cholesterol: 40 mg/dL (ref 0–99)
Total CHOL/HDL Ratio: 1.6 RATIO
Triglycerides: 33 mg/dL (ref <150)
VLDL: 7 mg/dL (ref 0–40)

## 2013-08-03 LAB — GLUCOSE, CAPILLARY
GLUCOSE-CAPILLARY: 122 mg/dL — AB (ref 70–99)
GLUCOSE-CAPILLARY: 98 mg/dL (ref 70–99)
Glucose-Capillary: 95 mg/dL (ref 70–99)
Glucose-Capillary: 97 mg/dL (ref 70–99)

## 2013-08-03 MED ORDER — LACOSAMIDE 50 MG PO TABS
100.0000 mg | ORAL_TABLET | Freq: Once | ORAL | Status: AC
  Administered 2013-08-03: 100 mg via ORAL
  Filled 2013-08-03: qty 2

## 2013-08-03 MED ORDER — LACOSAMIDE 50 MG PO TABS
50.0000 mg | ORAL_TABLET | Freq: Two times a day (BID) | ORAL | Status: DC
  Administered 2013-08-03 – 2013-08-04 (×2): 50 mg via ORAL
  Filled 2013-08-03 (×2): qty 1

## 2013-08-03 MED ORDER — POTASSIUM CHLORIDE CRYS ER 20 MEQ PO TBCR
40.0000 meq | EXTENDED_RELEASE_TABLET | Freq: Once | ORAL | Status: AC
  Administered 2013-08-03: 40 meq via ORAL
  Filled 2013-08-03: qty 2

## 2013-08-03 NOTE — Progress Notes (Signed)
Pt became very agitated during  the attempt to do his MRI. He refused further imaging demamnded to be "let out NOW" placed call to RN who was unable to come to the phone
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
## 2013-08-03 NOTE — Progress Notes (Signed)
  Echocardiogram 2D Echocardiogram has been performed.  Murrel Freet FRANCES 08/03/2013, 9:42 AM

## 2013-08-03 NOTE — Progress Notes (Signed)
Subjective: Patient with improved mental status.  Continues to have increased tone in the left upper extremity.  Objective: Current vital signs: BP 119/53  Pulse 68  Temp(Src) 98 F (36.7 C) (Oral)  Resp 14  Ht 5\' 9"  (1.753 m)  Wt 63.231 kg (139 lb 6.4 oz)  BMI 20.58 kg/m2  SpO2 97% Vital signs in last 24 hours: Temp:  [97.5 F (36.4 C)-98.4 F (36.9 C)] 98 F (36.7 C) (07/31 0958) Pulse Rate:  [59-88] 68 (07/31 0958) Resp:  [12-23] 14 (07/31 0958) BP: (91-175)/(48-94) 119/53 mmHg (07/31 0958) SpO2:  [95 %-100 %] 97 % (07/31 0958) Weight:  [63.231 kg (139 lb 6.4 oz)] 63.231 kg (139 lb 6.4 oz) (07/30 1830)  Intake/Output from previous day: 07/30 0701 - 07/31 0700 In: 20 [P.O.:20] Out: 200 [Urine:200] Intake/Output this shift: Total I/O In: 360 [P.O.:360] Out: -  Nutritional status: Dysphagia  Neurologic Exam: Mental Status:  Awake and alert.  Follows simple commands.  Speech slurred but fluent.   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  III,IV, VI: ptosis not present, extra-ocular motions intact bilaterally  V,VII: smile asymmetric, flattened in the left, with facial light touch sensation normal bilaterally  VIII: hearing decreased bilaterally with finger rub test  IX,X: gag reflex present  XI: normal shoulder shrug in the right, decreased in the left  XII: midline tongue extension without atrophy or fasciculations  Motor:  Right : Upper extremity 5/5         Left: Upper extremity 1/5   Lower extremity 5/5      Lower extremity 3/5  Tone increased in the left arm, normal in the right.  Sensory: Pinprick and light touch intact throughout, bilaterally  Deep Tendon Reflexes:  2+ throughout with 1+ ankle jerks bilaterally Plantars:  Right: upgoing     Left: upgoing   Cerebellar:  Normal right finger-to-nose test, unable to perform on left.    Lab Results: Basic Metabolic Panel:  Recent Labs Lab 08/02/13 1135 08/02/13 1940  NA 142  --   K 3.6*  --   CL 108  --   CO2 22  --   GLUCOSE 85  --   BUN 17  --   CREATININE 0.78  --   CALCIUM 8.8  --   MG  --  1.9  PHOS  --  2.6    Liver Function Tests:  Recent Labs Lab 08/02/13 1135  AST 18  ALT 15  ALKPHOS 72  BILITOT 0.7  PROT 7.2  ALBUMIN 3.8   No results found for this basename: LIPASE, AMYLASE,  in the last 168 hours No results found for this basename: AMMONIA,  in the last 168 hours  CBC:  Recent Labs Lab 08/02/13 1135  WBC 5.5  NEUTROABS 2.9  HGB 11.0*  HCT 33.7*  MCV 86.6  PLT 95*    Cardiac Enzymes: No results found for this basename: CKTOTAL, CKMB, CKMBINDEX, TROPONINI,  in the last 168 hours  Lipid Panel:  Recent Labs Lab 08/03/13 0601  CHOL 123  TRIG 33  HDL 76  CHOLHDL 1.6  VLDL 7  LDLCALC 40    CBG:  Recent Labs Lab 08/02/13 1117 08/02/13 1329 08/02/13 1704 08/02/13 2214 08/03/13 0652  GLUCAP 78 82 101* 135* 95    Microbiology:  Results for orders placed during the hospital encounter of 02/06/13  CULTURE, BLOOD (ROUTINE X 2)     Status: None   Collection Time    02/06/13  8:15 AM      Result Value Ref Range Status   Specimen Description BLOOD RIGHT HAND   Final   Special Requests BOTTLES DRAWN AEROBIC AND ANAEROBIC 5CC   Final   Culture  Setup Time     Final   Value: 02/06/2013 13:15     Performed at [facility]   Culture     Final   Value: NO GROWTH 5 DAYS     Performed at [facility]   Report Status 02/12/2013 FINAL   Final  CULTURE, BLOOD (ROUTINE X 2)     Status: None   Collection Time    02/06/13  8:25 AM      Result Value Ref Range Status   Specimen Description BLOOD LEFT HAND   Final   Special Requests BOTTLES DRAWN AEROBIC AND ANAEROBIC 5CC   Final   Culture  Setup Time     Final   Value:  02/06/2013 13:15     Performed at [facility]   Culture     Final   Value: NO GROWTH 5 DAYS     Performed at [facility]   Report Status 02/12/2013 FINAL   Final    Coagulation Studies:  Recent Labs  08/02/13 1135  LABPROT 15.5*  INR 1.23    Imaging: Dg Chest 2 View  08/02/2013   CLINICAL DATA:  Weakness. Previous stroke. History of hypertension, CHF, COPD.  EXAM: CHEST  2 VIEW  COMPARISON:  02/07/2013  FINDINGS: Patient has had median sternotomy and valve replacement. Heart is enlarged. Aorta is tortuous. There is central pulmonary vascular prominent but no overt edema. No focal consolidations. Small bilateral pleural effusions are present. There are chronic changes in both shoulders.  IMPRESSION: 1. Cardiomegaly without overt edema. 2. Small bilateral effusions.   Electronically Signed   By: Shon Hale M.D.   On: 08/02/2013 14:33   Ct Head Wo Contrast  08/02/2013   CLINICAL DATA:  Confusion.  Prior stroke.  EXAM: CT HEAD WITHOUT CONTRAST  TECHNIQUE: Contiguous axial images were obtained from the base of the skull through the vertex without intravenous contrast.  COMPARISON:  01/10/2013.  FINDINGS: No intracranial hemorrhage.  Remote large right hemispheric infarct most notable involving the right frontal lobe and basal ganglia with encephalomalacia and subsequent dilation of the right lateral ventricle and wallerian degeneration.  Remote left thalamic infarct.  No CT evidence of large acute infarct.  Atrophy without hydrocephalus.  No intracranial mass lesion noted on this unenhanced exam.  Vascular calcifications.  Spinal stenosis upper cervical spine.  Minimal mucosal thickening right maxillary sinus.  IMPRESSION: No intracranial hemorrhage.  Remote large right hemispheric infarct most notable involving the right frontal lobe and basal ganglia with encephalomalacia and subsequent dilation of the right lateral ventricle and wallerian degeneration.  Remote left thalamic  infarct.  No CT evidence of large acute infarct.  Spinal stenosis upper cervical spine.   Electronically Signed   By: Chauncey Cruel M.D.   On: 08/02/2013 12:42    Medications:  I have reviewed the patient's current medications. Scheduled: . aspirin EC  81 mg Oral Daily  . atorvastatin  40 mg Oral QHS  . brimonidine  1 drop Both Eyes BID  . clopidogrel  75 mg Oral Daily  . dorzolamide  1 drop Both Eyes TID  . heparin  5,000 Units Subcutaneous 3 times per day  . losartan  25 mg Oral Daily  . mometasone-formoterol  2 puff Inhalation BID  . multivitamin with minerals  1 tablet Oral Daily  . polyethylene glycol  17 g Oral Daily  . senna-docusate  1 tablet Oral Daily    Assessment/Plan: Patient improved.  EEG marred by artifact.  Unclear if presentation represented seizure activity.  Will start Vimpat and follow.  Magnesium and Phosphorus normal.    Recommendations: 1.  Vimpat 100mg  now then 50mg  BID 2.  Continue seizure precautions   LOS: 1 day   Alexis Goodell, MD [facility] [phone] 08/03/2013  11:24 AM
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
## 2013-08-03 NOTE — Progress Notes (Signed)
Bilateral carotid artery duplex completed:  1-39% ICA stenosis.  Vertebral artery flow is antegrade.  Right ECA occlusion.

## 2013-08-03 NOTE — Progress Notes (Signed)
[facility]                                                                              Patient Demographics  Aaron Johnson, is a 80 y.o. male, DOB - 07-Apr-1926, TDS:[id number]  Admit date - 08/02/2013   Admitting Physician Costin Karlyne Greenspan, MD  Outpatient Primary MD for the patient is Kandice Hams, MD  LOS - 1   Chief Complaint  Patient presents with  . Stroke Symptoms      HPI on 08/02/2013 Aaron Johnson is a 80 y.o. male with a Past Medical History of prior CVA, left hemiparesis, HTN, and hyperlipidemia who presents today with the above noted complaint. Per patient's daughter, symptoms began morning of admission, around 2 am, when she noticed he was behaving abnormally, "speaking out of his mind, confused where he was" with decreased movements of left extremities compared to baseline and strong contraction of left arm. He was taken to the ED around 11am and according to family he had then returned to baseline. At the time of admission, he was in no pain and not experiencing any new symptoms.Per daughter he was back to his baseline. Patient denied chest pain, SOB, nausea and vomiting.   Assessment & Plan   Acute encephalopathy, TIA versus CVA versus seizure -Patient appears to baseline. -CT showed no intracranial hemorrhage, remote large right hemispheric infarct and left thalamic infarct. Spinal stenosis of the upper cervical spine -MRI of the brain pending -Neurology consulted and appreciated and has started patient on Vimpat -Continue seizure precautions -EEG: Technically difficult record due to prominence of muscle and movement artifact. 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 Vertebral artery flow is antegrade. Right ECA occlusion.  Echocardiogram Study Conclusions - Left ventricle: The cavity size was normal. Wall thickness was increased in a pattern of mild LVH. Systolic function was moderately to severely reduced. The estimated ejection fraction was in the range of 30% to 35%. There was an increased relative contribution of atrial contraction to ventricular filling. Doppler parameters are consistent with abnormal left ventricular relaxation (grade 1 diastolic dysfunction). - Aortic valve: A bioprosthesis was present. There was mild stenosis. There was mild to moderate regurgitation. Valve area (VTI): 0.59 cm^2. Valve area (Vmax): 0.57 cm^2. - Right atrium: The atrium was mildly dilated. - Pulmonary arteries: PA peak pressure: 31 mm Hg (S).  EEG: Technically difficult record due to prominence of muscle and movement artifact. Adequate evaluation of the background rhythm could not be performed  Consults   Neurology  DVT Prophylaxis  Heparin  Lab Results  Component Value Date   PLT 95* 08/02/2013  Medications  Scheduled Meds: . aspirin EC  81 mg Oral Daily  . atorvastatin  40 mg Oral QHS  . brimonidine  1 drop Both Eyes BID  . clopidogrel  75 mg Oral Daily  . dorzolamide  1 drop Both Eyes TID  . heparin  5,000 Units Subcutaneous 3 times per day  . lacosamide  100 mg Oral Once  . lacosamide  50 mg Oral BID  . losartan  25 mg Oral Daily  . mometasone-formoterol  2 puff Inhalation BID  . multivitamin with minerals  1 tablet Oral Daily  . polyethylene glycol  17 g Oral Daily  . senna-docusate   1 tablet Oral Daily   Continuous Infusions: . sodium chloride 40 mL/hr at 08/02/13 1600   PRN Meds:.acetaminophen, albuterol, ondansetron (ZOFRAN) IV, oxyCODONE-acetaminophen  Antibiotics    Anti-infectives   None      Subjective:   Aaron Johnson seen and examined today.  Patient has no complaints, he states he is hungry. Patient denies any chest pain, dizziness, headache, shortness of breath at this time. Daughter is at bedside and states patient is back to his baseline.   Objective:   Filed Vitals:   08/03/13 0202 08/03/13 0401 08/03/13 0607 08/03/13 0958  BP: 91/49 125/48 143/53 119/53  Pulse: 62 62 62 68  Temp: 97.5 F (36.4 C) 97.9 F (36.6 C) 97.9 F (36.6 C) 98 F (36.7 C)  TempSrc: Axillary Axillary Axillary Oral  Resp: 12 15 15 14   Height:      Weight:      SpO2: 100% 98% 100% 97%    Wt Readings from Last 3 Encounters:  08/02/13 63.231 kg (139 lb 6.4 oz)  02/07/13 70.852 kg (156 lb 3.2 oz)  12/18/12 67.132 kg (148 lb)     Intake/Output Summary (Last 24 hours) at 08/03/13 1258 Last data filed at 08/03/13 0900  Gross per 24 hour  Intake    380 ml  Output    200 ml  Net    180 ml    Exam  General: Well developed, well nourished, NAD, appears stated age  HEENT: NCAT, Anicteic Sclera, mucous membranes moist.   Neck: Supple, no JVD, no masses  Cardiovascular: S1 S2 auscultated, 2/6SEM, Regular rate and rhythm.  Respiratory: Clear to auscultation bilaterally with equal chest rise  Abdomen: Soft, nontender, nondistended, + bowel sounds  Extremities: warm dry without cyanosis clubbing or edema  Neuro: AAOx3, Left eye blindness, otherwise CN 2-12 intact Strength 1/5 LUE, 3/5 LLE, 5/5 in RLE and RUE  Skin: Without rashes exudates or nodules  Psych: Normal affect and demeanor  Data Review   Micro Results No results found for this or any previous visit (from the past 240 hour(s)).  Radiology Reports Dg Chest 2 View  08/02/2013   CLINICAL  DATA:  Weakness. Previous stroke. History of hypertension, CHF, COPD.  EXAM: CHEST  2 VIEW  COMPARISON:  02/07/2013  FINDINGS: Patient has had median sternotomy and valve replacement. Heart is enlarged. Aorta is tortuous. There is central pulmonary vascular prominent but no overt edema. No focal consolidations. Small bilateral pleural effusions are present. There are chronic changes in both shoulders.  IMPRESSION: 1. Cardiomegaly without overt edema. 2. Small bilateral effusions.   Electronically Signed   By: Shon Hale M.D.   On: 08/02/2013 14:33   Ct Head Wo Contrast  08/02/2013   CLINICAL DATA:  Confusion.  Prior stroke.  EXAM: CT HEAD WITHOUT CONTRAST  TECHNIQUE: Contiguous axial images were  obtained from the base of the skull through the vertex without intravenous contrast.  COMPARISON:  01/10/2013.  FINDINGS: No intracranial hemorrhage.  Remote large right hemispheric infarct most notable involving the right frontal lobe and basal ganglia with encephalomalacia and subsequent dilation of the right lateral ventricle and wallerian degeneration.  Remote left thalamic infarct.  No CT evidence of large acute infarct.  Atrophy without hydrocephalus.  No intracranial mass lesion noted on this unenhanced exam.  Vascular calcifications.  Spinal stenosis upper cervical spine.  Minimal mucosal thickening right maxillary sinus.  IMPRESSION: No intracranial hemorrhage.  Remote large right hemispheric infarct most notable involving the right frontal lobe and basal ganglia with encephalomalacia and subsequent dilation of the right lateral ventricle and wallerian degeneration.  Remote left thalamic infarct.  No CT evidence of large acute infarct.  Spinal stenosis upper cervical spine.   Electronically Signed   By: Chauncey Cruel M.D.   On: 08/02/2013 12:42    CBC  Recent Labs Lab 08/02/13 1135  WBC 5.5  HGB 11.0*  HCT 33.7*  PLT 95*  MCV 86.6  MCH 28.3  MCHC 32.6  RDW 13.6  LYMPHSABS 1.7  MONOABS 0.6    EOSABS 0.2  BASOSABS 0.0    Chemistries   Recent Labs Lab 08/02/13 1135 08/02/13 1940  NA 142  --   K 3.6*  --   CL 108  --   CO2 22  --   GLUCOSE 85  --   BUN 17  --   CREATININE 0.78  --   CALCIUM 8.8  --   MG  --  1.9  AST 18  --   ALT 15  --   ALKPHOS 72  --   BILITOT 0.7  --    ------------------------------------------------------------------------------------------------------------------ estimated creatinine clearance is 60.3 ml/min (by C-G formula based on Cr of 0.78). ------------------------------------------------------------------------------------------------------------------  Recent Labs  08/02/13 1135  HGBA1C 5.1   ------------------------------------------------------------------------------------------------------------------  Recent Labs  08/03/13 0601  CHOL 123  HDL 76  LDLCALC 40  TRIG 33  CHOLHDL 1.6   ------------------------------------------------------------------------------------------------------------------ No results found for this basename: TSH, T4TOTAL, FREET3, T3FREE, THYROIDAB,  in the last 72 hours ------------------------------------------------------------------------------------------------------------------ No results found for this basename: VITAMINB12, FOLATE, FERRITIN, TIBC, IRON, RETICCTPCT,  in the last 72 hours  Coagulation profile  Recent Labs Lab 08/02/13 1135  INR 1.23    No results found for this basename: DDIMER,  in the last 72 hours  Cardiac Enzymes No results found for this basename: CK, CKMB, TROPONINI, MYOGLOBIN,  in the last 168 hours ------------------------------------------------------------------------------------------------------------------ No components found with this basename: POCBNP,     Mallory Enriques D.O. on 08/03/2013 at 12:58 PM  Between 7am to 7pm - Pager - [phone]  After 7pm go to [email or web address] - password [email or web address]  And look for the night coverage person covering for  me after hours  [facility] Office  [phone]

## 2013-08-04 DIAGNOSIS — M959 Acquired deformity of musculoskeletal system, unspecified: Secondary | ICD-10-CM

## 2013-08-04 DIAGNOSIS — G9340 Encephalopathy, unspecified: Secondary | ICD-10-CM | POA: Diagnosis not present

## 2013-08-04 LAB — GLUCOSE, CAPILLARY: Glucose-Capillary: 89 mg/dL (ref 70–99)

## 2013-08-04 LAB — BASIC METABOLIC PANEL WITH GFR
Anion gap: 10 (ref 5–15)
BUN: 13 mg/dL (ref 6–23)
CO2: 25 meq/L (ref 19–32)
Calcium: 8.7 mg/dL (ref 8.4–10.5)
Chloride: 107 meq/L (ref 96–112)
Creatinine, Ser: 0.81 mg/dL (ref 0.50–1.35)
GFR calc Af Amer: >90 mL/min
GFR calc non Af Amer: 79 mL/min — ABNORMAL LOW
Glucose, Bld: 91 mg/dL (ref 70–99)
Potassium: 4.2 meq/L (ref 3.7–5.3)
Sodium: 142 meq/L (ref 137–147)

## 2013-08-04 MED ORDER — LACOSAMIDE 50 MG PO TABS: 50.0000 mg | ORAL_TABLET | Freq: Two times a day (BID) | ORAL | Status: DC

## 2013-08-04 NOTE — Progress Notes (Signed)
Subjective: Patient remains stable  Objective: Current vital signs: BP 136/73  Pulse 74  Temp(Src) 97.4 F (36.3 C) (Oral)  Resp 18  Ht 5\' 9"  (1.753 m)  Wt 63.231 kg (139 lb 6.4 oz)  BMI 20.58 kg/m2  SpO2 96% Vital signs in last 24 hours: Temp:  [97.4 F (36.3 C)-98.2 F (36.8 C)] 97.4 F (36.3 C) (08/01 0922) Pulse Rate:  [65-77] 74 (08/01 0922) Resp:  [16-18] 18 (08/01 0922) BP: (117-136)/(63-77) 136/73 mmHg (08/01 0922) SpO2:  [96 %-100 %] 96 % (08/01 0922)  Intake/Output from previous day: 07/31 0701 - 08/01 0700 In: 720 [P.O.:720] Out: 1050 [Urine:1050] Intake/Output this shift: Total I/O In: 240 [P.O.:240] Out: -  Nutritional status: Dysphagia  Neurologic Exam: Mental Status:  Awake and alert. Follows simple commands. Speech slurred but fluent.  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  III,IV, VI: ptosis not present, extra-ocular motions intact bilaterally  V,VII: smile asymmetric, flattened in the left, with facial light touch sensation normal bilaterally  VIII: hearing decreased bilaterally with finger rub test  IX,X: gag reflex present  XI: normal shoulder shrug in the right, decreased in the left  XII: midline tongue extension without atrophy or fasciculations  Motor:  Right : Upper extremity 5/5    Left: Upper extremity 1/5   Lower extremity 5/5            Lower extremity 3/5  Tone increased in the left arm, normal in the right.  Sensory: Pinprick and light touch intact throughout, bilaterally  Deep Tendon Reflexes:  2+ throughout with 1+ ankle jerks bilaterally  Plantars:  Right: upgoing Left: upgoing  Cerebellar:  Normal right finger-to-nose test, unable to perform on left.    Lab Results: Basic Metabolic Panel:  Recent Labs Lab 08/02/13 1135  08/02/13 1940 08/04/13 0453  NA 142  --  142  K 3.6*  --  4.2  CL 108  --  107  CO2 22  --  25  GLUCOSE 85  --  91  BUN 17  --  13  CREATININE 0.78  --  0.81  CALCIUM 8.8  --  8.7  MG  --  1.9  --   PHOS  --  2.6  --     Liver Function Tests:  Recent Labs Lab 08/02/13 1135  AST 18  ALT 15  ALKPHOS 72  BILITOT 0.7  PROT 7.2  ALBUMIN 3.8   No results found for this basename: LIPASE, AMYLASE,  in the last 168 hours No results found for this basename: AMMONIA,  in the last 168 hours  CBC:  Recent Labs Lab 08/02/13 1135  WBC 5.5  NEUTROABS 2.9  HGB 11.0*  HCT 33.7*  MCV 86.6  PLT 95*    Cardiac Enzymes: No results found for this basename: CKTOTAL, CKMB, CKMBINDEX, TROPONINI,  in the last 168 hours  Lipid Panel:  Recent Labs Lab 08/03/13 0601  CHOL 123  TRIG 33  HDL 76  CHOLHDL 1.6  VLDL 7  LDLCALC 40    CBG:  Recent Labs Lab 08/03/13 0652 08/03/13 1220 08/03/13 1705 08/03/13 2136 08/04/13 0653  GLUCAP 95 122* 98 97 89    Microbiology: Results for orders placed during the hospital encounter of 02/06/13  CULTURE, BLOOD (ROUTINE X 2)  Status: None   Collection Time    02/06/13  8:15 AM      Result Value Ref Range Status   Specimen Description BLOOD RIGHT HAND   Final   Special Requests BOTTLES DRAWN AEROBIC AND ANAEROBIC 5CC   Final   Culture  Setup Time     Final   Value: 02/06/2013 13:15     Performed at [facility]   Culture     Final   Value: NO GROWTH 5 DAYS     Performed at [facility]   Report Status 02/12/2013 FINAL   Final  CULTURE, BLOOD (ROUTINE X 2)     Status: None   Collection Time    02/06/13  8:25 AM      Result Value Ref Range Status   Specimen Description BLOOD LEFT HAND   Final   Special Requests BOTTLES DRAWN AEROBIC AND ANAEROBIC 5CC   Final   Culture  Setup Time     Final   Value: 02/06/2013 13:15     Performed at [facility]   Culture     Final   Value: NO GROWTH 5 DAYS      Performed at [facility]   Report Status 02/12/2013 FINAL   Final    Coagulation Studies:  Recent Labs  08/02/13 1135  LABPROT 15.5*  INR 1.23    Imaging: Dg Chest 2 View  08/02/2013   CLINICAL DATA:  Weakness. Previous stroke. History of hypertension, CHF, COPD.  EXAM: CHEST  2 VIEW  COMPARISON:  02/07/2013  FINDINGS: Patient has had median sternotomy and valve replacement. Heart is enlarged. Aorta is tortuous. There is central pulmonary vascular prominent but no overt edema. No focal consolidations. Small bilateral pleural effusions are present. There are chronic changes in both shoulders.  IMPRESSION: 1. Cardiomegaly without overt edema. 2. Small bilateral effusions.   Electronically Signed   By: Shon Hale M.D.   On: 08/02/2013 14:33   Ct Head Wo Contrast  08/02/2013   CLINICAL DATA:  Confusion.  Prior stroke.  EXAM: CT HEAD WITHOUT CONTRAST  TECHNIQUE: Contiguous axial images were obtained from the base of the skull through the vertex without intravenous contrast.  COMPARISON:  01/10/2013.  FINDINGS: No intracranial hemorrhage.  Remote large right hemispheric infarct most notable involving the right frontal lobe and basal ganglia with encephalomalacia and subsequent dilation of the right lateral ventricle and wallerian degeneration.  Remote left thalamic infarct.  No CT evidence of large acute infarct.  Atrophy without hydrocephalus.  No intracranial mass lesion noted on this unenhanced exam.  Vascular calcifications.  Spinal stenosis upper cervical spine.  Minimal mucosal thickening right maxillary sinus.  IMPRESSION: No intracranial hemorrhage.  Remote large right hemispheric infarct most notable involving the right frontal lobe and basal ganglia with encephalomalacia and subsequent dilation of the right lateral ventricle and wallerian degeneration.  Remote left thalamic infarct.  No CT evidence of large acute infarct.  Spinal stenosis upper cervical spine.   Electronically Signed    By: Chauncey Cruel M.D.   On: 08/02/2013 12:42    Medications:  I have reviewed the patient's current medications. Scheduled: . aspirin EC  81 mg Oral Daily  . atorvastatin  40 mg Oral QHS  . brimonidine  1 drop Both Eyes BID  . clopidogrel  75 mg Oral Daily  . dorzolamide  1 drop Both Eyes TID  . heparin  5,000 Units Subcutaneous 3 times per day  . lacosamide  50 mg Oral BID  . losartan  25 mg Oral Daily  . mometasone-formoterol  2 puff Inhalation BID  . multivitamin with minerals  1 tablet Oral Daily  . polyethylene glycol  17 g Oral Daily  . senna-docusate  1 tablet Oral Daily    Assessment/Plan: Patient appears to be tolerating Vimpat without noted sided effects.  No further events noted.  Patient has refused MRI.  Recommendations: 1.  Continue Vimpat at current dose 2.  Follow up with [facility] as an outpatient  Case discussed with Dr. Ree Kida   LOS: 2 days   Alexis Goodell, MD [facility] [phone] 08/04/2013  9:59 AM

## 2013-08-04 NOTE — Progress Notes (Signed)
UR Completed.  Aaron Johnson [id number] 08/04/2013

## 2013-08-04 NOTE — Progress Notes (Signed)
Patient is discharged from room 4N20 at this time. Alert and in stable condition. IV site d/c'd as well as tele. Instructions read to daughter and understanding verbalized. Left unit via wheelchair with all belongings.

## 2013-08-04 NOTE — Discharge Summary (Signed)
Physician Discharge Summary  TERREON EKHOLM EYE:[id number] DOB: [birth year] DOA: 08/02/2013  PCP: Kandice Hams, MD  Admit date: 08/02/2013 Discharge date: 08/04/2013  Time spent: 45 minutes  Recommendations for Outpatient Follow-up:  Patient will be discharged to home. Patient is to continue his medications as prescribed. Patient should follow up with neurology within 3-4 weeks of discharge. Patient should also follow up with his primary care physician within one week of discharge.   Patient should continue dysphagia 1 diet.  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 male with a Past Medical History of prior CVA, left hemiparesis, HTN, and hyperlipidemia who presents today with the above noted complaint. Per patient's daughter, symptoms began morning of admission, around 2 am, when she noticed he was behaving abnormally, "speaking out of his mind, confused where he was" with decreased movements of left extremities compared to baseline and strong contraction of left arm. He was taken to the ED around 11am and according to family he had then returned to baseline. At the time of admission, he was in no pain and not experiencing any new symptoms.Per daughter he was back to his baseline. Patient denied chest pain, SOB, nausea and vomiting.  Hospital Course:  Acute encephalopathy, TIA versus CVA versus seizure  -Patient appears to baseline.  -CT showed no intracranial hemorrhage, remote large right hemispheric infarct and left thalamic infarct. Spinal stenosis of the upper cervical spine  -MRI  was not done due to patient's refusal -Neurology consulted and appreciated and has started patient on Vimpat  -Was placed on seizure precautions. -EEG: Technically difficult record due to prominence of muscle and movement artifact. Adequate evaluation of the background rhythm could not be performed  -LDL 40, HbA1c 5.1  -Continue statin and aspirin and Plavix   Hypertension  -Currently stable, continue losartan   Dyslipidemia  -Continue statin   Chronic diastolic congestive heart failure  -Appears compensated, continue monitoring daily weights and intake and output  -Resume Lasix upon discharge  History of aortic valve replacement   History of prior CVA in 1969  -With residual left-sided hemiparesis and left eye blindness  Procedures  Carotid Doppler: Bilateral carotid artery duplex completed: 1-39% ICA stenosis. Vertebral artery flow is antegrade. Right ECA occlusion.   Echocardiogram  Study Conclusions - Left ventricle: The cavity size was normal. Wall thickness was increased in a pattern of mild LVH. Systolic function was moderately to severely reduced. The estimated ejection fraction was in the range of 30% to 35%. There was an increased relative contribution of atrial contraction to ventricular filling. Doppler parameters are consistent with abnormal left ventricular relaxation (grade 1 diastolic dysfunction). - Aortic valve: A bioprosthesis was present. There was mild stenosis. There was mild to moderate regurgitation. Valve area (VTI): 0.59 cm^2. Valve area (Vmax): 0.57 cm^2. - Right atrium: The atrium was mildly dilated. - Pulmonary arteries: PA peak pressure: 31 mm Hg (S).   EEG: Technically difficult record due to prominence of muscle and movement artifact. Adequate evaluation of the background rhythm could not be performed  Consults  Neurology  Discharge Exam: Filed Vitals:   08/04/13 0922  BP: 136/73  Pulse: 74  Temp: 97.4 F (36.3 C)  Resp: 18   Exam    General: Well developed, well nourished, NAD, appears stated age  HEENT: NCAT, mucous membranes moist.  Neck: Supple, no JVD, no masses  Cardiovascular: S1 S2 auscultated, 2/6SEM, Regular rate and rhythm.  Respiratory: Clear to auscultation bilaterally with equal chest rise  Abdomen: Soft, nontender, nondistended, + bowel sounds  Extremities: warm dry without cyanosis clubbing or edema  Neuro: AAOx3, Left eye blindness, no new focal deficits Skin: Without rashes exudates or nodules  Psych: Normal affect and demeanor  Discharge Instructions      Discharge Instructions   Discharge instructions    Complete by:  As directed   Patient will be discharged to home. Patient is to continue his medications as prescribed. Patient should follow up with neurology within 3-4 weeks of discharge. Patient should also follow up with his primary care physician within one week of discharge.   Patient should continue dysphagia 1 diet.            Medication List         ADVAIR DISKUS 250-50 MCG/DOSE Aepb  Generic drug:  Fluticasone-Salmeterol  Inhale 2 puffs into the lungs daily.     albuterol-ipratropium 18-103 MCG/ACT inhaler  Commonly known as:  COMBIVENT  Inhale 1 puff into the lungs every 4 (four) hours as needed for wheezing or shortness of breath.     aspirin 81 MG tablet  Take 81 mg by mouth daily.     atorvastatin 40 MG tablet  Commonly known as:  LIPITOR  Take 40 mg by mouth at bedtime.     brimonidine 0.1 % Soln  Commonly known as:  ALPHAGAN P  Place 1 drop into both eyes 2 (two) times daily.     clopidogrel 75 MG tablet  Commonly known as:  PLAVIX  Take 75 mg by mouth daily.     dorzolamide 2 % ophthalmic solution  Commonly known as:  TRUSOPT  Place 1 drop into both eyes 3 (three) times daily.     furosemide 20 MG tablet  Commonly known as:  LASIX  Take 20 mg by mouth daily as needed for fluid or edema. For edema     lacosamide 50 MG Tabs tablet  Commonly known as:   VIMPAT  Take 1 tablet (50 mg total) by mouth 2 (two) times daily.     losartan 25 MG tablet  Commonly known as:  COZAAR  Take 25 mg by mouth daily.     multivitamin tablet  Take 1 tablet by mouth daily.     oxyCODONE-acetaminophen 5-325 MG per tablet  Commonly known as:  PERCOCET/ROXICET  Take 0.5-1 tablets by mouth every 4 (four) hours as needed for moderate pain.     polyethylene glycol packet  Commonly known as:  MIRALAX / GLYCOLAX  Take 17 g by mouth daily.     potassium chloride 20 MEQ packet  Commonly known as:  KLOR-CON  Take 20 mEq by mouth daily as needed (for edema when taking lasix).     senna-docusate 8.6-50 MG per tablet  Commonly known as:  Senokot-S  Take 1 tablet by mouth daily.       No Known Allergies Follow-up Information   Follow up with [facility]. Schedule an appointment as soon as possible for a visit in 1 month. [facility] follow, Possible seizure)    Specialty:  Radiology   Contact information:   [address] [place] [address] [phone]      Follow up with Kandice Hams, MD. Schedule an appointment as soon as possible for a visit in 1 week. [facility] followup)    Specialty:  Internal Medicine   Contact information:   [address]  [place] [address] [phone]        The results of significant diagnostics from this hospitalization (including imaging, microbiology, ancillary and laboratory) are listed below for reference.    Significant Diagnostic Studies: Dg Chest 2 View  08/02/2013   CLINICAL DATA:  Weakness. Previous stroke. History of hypertension, CHF, COPD.  EXAM: CHEST  2 VIEW  COMPARISON:  02/07/2013  FINDINGS: Patient has had median sternotomy and valve replacement. Heart is enlarged. Aorta is tortuous. There is central pulmonary vascular prominent but no overt edema. No focal consolidations. Small bilateral pleural effusions are present. There are chronic  changes in both shoulders.  IMPRESSION: 1. Cardiomegaly without overt edema. 2. Small bilateral effusions.   Electronically Signed   By: Shon Hale M.D.   On: 08/02/2013 14:33   Ct Head Wo Contrast  08/02/2013   CLINICAL DATA:  Confusion.  Prior stroke.  EXAM: CT HEAD WITHOUT CONTRAST  TECHNIQUE: Contiguous axial images were obtained from the base of the skull through the vertex without intravenous contrast.  COMPARISON:  01/10/2013.  FINDINGS: No intracranial hemorrhage.  Remote large right hemispheric infarct most notable involving the right frontal lobe and basal ganglia with encephalomalacia and subsequent dilation of the right lateral ventricle and wallerian degeneration.  Remote left thalamic infarct.  No CT evidence of large acute infarct.  Atrophy without hydrocephalus.  No intracranial mass lesion noted on this unenhanced exam.  Vascular calcifications.  Spinal stenosis upper cervical spine.  Minimal mucosal thickening right maxillary sinus.  IMPRESSION: No intracranial hemorrhage.  Remote large right hemispheric infarct most notable involving the right frontal lobe and basal ganglia with encephalomalacia and subsequent dilation of the right lateral ventricle and wallerian degeneration.  Remote left thalamic infarct.  No CT evidence of large acute infarct.  Spinal stenosis upper cervical spine.   Electronically Signed   By: Chauncey Cruel M.D.   On: 08/02/2013 12:42    Microbiology: No results found for this or any previous visit (from the past 240 hour(s)).   Labs: Basic Metabolic Panel:  Recent Labs Lab 08/02/13 1135 08/02/13 1940 08/04/13 0453  NA 142  --  142  K 3.6*  --  4.2  CL 108  --  107  CO2 22  --  25  GLUCOSE 85  --  91  BUN 17  --  13  CREATININE 0.78  --  0.81  CALCIUM 8.8  --  8.7  MG  --  1.9  --   PHOS  --  2.6  --    Liver Function Tests:  Recent Labs Lab 08/02/13 1135  AST 18  ALT 15  ALKPHOS 72  BILITOT 0.7  PROT 7.2  ALBUMIN 3.8   No results found  for this basename: LIPASE, AMYLASE,  in the last 168 hours No results found for this basename: AMMONIA,  in the last 168 hours CBC:  Recent Labs Lab 08/02/13 1135  WBC 5.5  NEUTROABS 2.9  HGB 11.0*  HCT 33.7*  MCV 86.6  PLT 95*   Cardiac Enzymes: No results found for this basename: CKTOTAL, CKMB, CKMBINDEX, TROPONINI,  in the last 168 hours BNP: BNP (last 3 results) No results found for this  basename: PROBNP,  in the last 8760 hours CBG:  Recent Labs Lab 08/03/13 0652 08/03/13 1220 08/03/13 1705 08/03/13 2136 08/04/13 0653  GLUCAP 95 122* 98 97 89       Signed:  Margart Zemanek  [facility] 08/04/2013, 9:40 AM

## 2013-08-04 NOTE — Discharge Instructions (Signed)

## 2013-08-15 ENCOUNTER — Other Ambulatory Visit: Payer: Self-pay | Admitting: *Deleted

## 2013-08-15 DIAGNOSIS — I499 Cardiac arrhythmia, unspecified: Secondary | ICD-10-CM

## 2013-08-15 NOTE — Progress Notes (Signed)
Per Vaughan Basta in [facility] this holter monitor should be ordered and placed under DOD Dr Rayann Heman.  This pt referred from an outside MD.

## 2013-08-16 ENCOUNTER — Encounter: Payer: Self-pay | Admitting: *Deleted

## 2013-08-16 ENCOUNTER — Encounter (INDEPENDENT_AMBULATORY_CARE_PROVIDER_SITE_OTHER): Payer: PRIVATE HEALTH INSURANCE

## 2013-08-16 DIAGNOSIS — I498 Other specified cardiac arrhythmias: Secondary | ICD-10-CM

## 2013-08-16 DIAGNOSIS — I499 Cardiac arrhythmia, unspecified: Secondary | ICD-10-CM

## 2013-08-16 NOTE — Progress Notes (Signed)
Patient ID: Aaron Johnson, male   DOB: 09-05-1926, 80 y.o.   MRN: [id number] [facility] 24 hour holter monitor applied to patient.

## 2013-08-23 ENCOUNTER — Telehealth: Payer: Self-pay | Admitting: Cardiology

## 2013-08-23 NOTE — Telephone Encounter (Signed)
Spoke with deanna, referred to dr polite

## 2013-08-23 NOTE — Telephone Encounter (Signed)
New Message  [employer] called.. Requests a call back to determine who ordered the labs for 08/16/2013.Marland Kitchen Please call back to confirm

## 2013-10-02 ENCOUNTER — Encounter: Payer: Self-pay | Admitting: Nurse Practitioner

## 2013-10-02 ENCOUNTER — Ambulatory Visit (INDEPENDENT_AMBULATORY_CARE_PROVIDER_SITE_OTHER): Payer: Medicare Other | Admitting: Nurse Practitioner

## 2013-10-02 VITALS — BP 119/69 | HR 61 | Ht 66.0 in | Wt 138.8 lb

## 2013-10-02 DIAGNOSIS — G8194 Hemiplegia, unspecified affecting left nondominant side: Secondary | ICD-10-CM

## 2013-10-02 DIAGNOSIS — G8190 Hemiplegia, unspecified affecting unspecified side: Secondary | ICD-10-CM

## 2013-10-02 DIAGNOSIS — R569 Unspecified convulsions: Secondary | ICD-10-CM

## 2013-10-02 NOTE — Progress Notes (Signed)
PATIENT: Aaron Johnson DOB: [birth year]  REASON FOR VISIT: routine follow up for stroke HISTORY FROM: patient  HISTORY OF PRESENT ILLNESS: Aaron Johnson is an 80 y.o. male, right handed, with a past medical history significant for HTN, hypercholesterolemia, chronic diastolic CHF, s/p aortic valve replacement, stroke with residual left hemiparesis and dysarthria in 1969, blindness left eye, brought to [facility] ED by his daughter 10/13/2012 due to worsening right sided weakness and dysarthria. She said that he is able to ambulate with a hemi walker, but last night 10/12/2012 she noticed that he was not able to use his right side as he usually does and couldn't even drink form a cup with the right hand. She stated that today he has been completely unable to use the right side. In addition, she thinks that his speech is significantly worse and he was drooling a lot earlier today. Mr. Blank denied HA, vertigo, confusion, or falls. No recent fever or infection. On aspirin and plavix daily. Patient was not a TPA candidate secondary to delay in arrival. He was admitted to the neuro floor for further evaluation and treatment.  MRI of the brain showed an acute nonhemorrhagic left thalamic infarct. MRA of the Head was significantly motion degraded. The patient is known to have an occluded right internal carotid artery. All that can be stated on the present examination is that there is flow within portions of the; vertebral arteries bilaterally, basilar artery and the left internal carotid artery. Carotid Doppler showed 1-39% internal carotid artery stenosis bilaterally. 2D Echocardiogram with EF 60-65% with no source of embolus.  He has completed home PT and ST. He is essentially back to his baseline. He is taking aspirin and Plavix with no signs of bleeding or significant bruising. BP is well controlled, is 125/62 in office today. He lives with his daughter and is able to ambulate with a hemi-walker for short  distances.   Update 10/02/13 (LL):  80 yr old man with PMH of CVA in 1969 with residual dysarthria and left UE and LE hemiparesis, left eye blindness, s/p aortic valve replacement, presented on 08/02/13 with acute onset mental status change, left arm stiffness which have resolved by the time he arrived to the ED but then returned. CThead reviewed and revealed a remote large right hemispheric infarct and left thalamic infarct, but not acute changes. He is on Plavix on ASA. He had work up for stroke in October 2014 with negative CT head but could not remain still for MRI/MRA. Carotid Doppler with 1-39% stenosis bilaterally, 2D echo with no PFO or thrombus.  EEG showed artifact and was suboptimal for evaluation of spell.  He continues to be unsteady on his feet and has fallen at home in the last 3 months. His daughter states he continues to loose weight despite eating well.  REVIEW OF SYSTEMS: Full 14 system review of systems performed and notable only for: eye redness, back pain, speech difficulty, weakness   ALLERGIES: No Known Allergies  HOME MEDICATIONS: Outpatient Prescriptions Prior to Visit  Medication Sig Dispense Refill  . albuterol-ipratropium (COMBIVENT) 18-103 MCG/ACT inhaler Inhale 1 puff into the lungs every 4 (four) hours as needed for wheezing or shortness of breath.       Marland Kitchen aspirin 81 MG tablet Take 81 mg by mouth daily.      Marland Kitchen atorvastatin (LIPITOR) 40 MG tablet Take 40 mg by mouth at bedtime.       . brimonidine (ALPHAGAN P) 0.1 % SOLN  Place 1 drop into both eyes 2 (two) times daily.      . dorzolamide (TRUSOPT) 2 % ophthalmic solution Place 1 drop into both eyes 3 (three) times daily.      . Fluticasone-Salmeterol (ADVAIR DISKUS) 250-50 MCG/DOSE AEPB Inhale 2 puffs into the lungs daily.       . furosemide (LASIX) 20 MG tablet Take 20 mg by mouth daily as needed for fluid or edema. For edema      . lacosamide (VIMPAT) 50 MG TABS tablet Take 1 tablet (50 mg total) by mouth 2 (two)  times daily.  60 tablet  0  . losartan (COZAAR) 25 MG tablet Take 25 mg by mouth daily.      . Multiple Vitamin (MULTIVITAMIN) tablet Take 1 tablet by mouth daily.        Marland Kitchen oxyCODONE-acetaminophen (PERCOCET) 5-325 MG per tablet Take 0.5-1 tablets by mouth every 4 (four) hours as needed for moderate pain.       . polyethylene glycol (MIRALAX / GLYCOLAX) packet Take 17 g by mouth daily.      . potassium chloride (KLOR-CON) 20 MEQ packet Take 20 mEq by mouth daily as needed (for edema when taking lasix).       Marland Kitchen senna-docusate (SENOKOT-S) 8.6-50 MG per tablet Take 1 tablet by mouth daily.       . clopidogrel (PLAVIX) 75 MG tablet Take 75 mg by mouth daily.       No facility-administered medications prior to visit.    PHYSICAL EXAM Filed Vitals:   10/02/13 0934  BP: 119/69  Pulse: 61  Height: 5\' 6"  (1.676 m)  Weight: 138 lb 12.8 oz (62.959 kg)   Body mass index is 22.41 kg/(m^2). No exam data present No flowsheet data found.  No flowsheet data found.   Generalized: Frail, cachectic african american elderly male not in distress  Head: normocephalic and atraumatic. Oropharynx benign  Neck: Supple, no carotid bruits  Cardiac: Regular rate rhythm, 2/6 murmur  Musculoskeletal: No deformity   Neurological examination  Mentation: Alert oriented to time, place, history taking. Follows all commands, dysarthric but can be understood  Cranial nerve II-XII: Decrease vision left eye old. Blind in right eye. left lower face weak. hearing was decreased to finger rubbing bilaterally. Uvula tongue midline. head turning and shoulder shrug decreased on the left.  Motor: Spastic left hemiplegia 1/5 LUE and 3/5 LLE strength. Rt side normal strength.  Sensory: normal and symmetric to light touch, pinprick, and vibration  Coordination: The patient is apraxic with finger nose finger, and heel to shin  Reflexes: Deep tendon reflexes in the upper and lower extremities are present and trace.  Gait and  Station: ambulation was not test; in wheelchair.  mRankin 3, NIHSS 11  DIAGNOSTIC DATA (LABS, IMAGING, TESTING) - I reviewed patient records, labs, notes, testing and imaging myself where available.  Lab Results  Component Value Date   WBC 5.5 08/02/2013   HGB 11.0* 08/02/2013   HCT 33.7* 08/02/2013   MCV 86.6 08/02/2013   PLT 95* 08/02/2013      Component Value Date/Time   NA 142 08/04/2013 0453   K 4.2 08/04/2013 0453   CL 107 08/04/2013 0453   CO2 25 08/04/2013 0453   GLUCOSE 91 08/04/2013 0453   BUN 13 08/04/2013 0453   CREATININE 0.81 08/04/2013 0453   CALCIUM 8.7 08/04/2013 0453   PROT 7.2 08/02/2013 1135   ALBUMIN 3.8 08/02/2013 1135   AST 18 08/02/2013 1135  ALT 15 08/02/2013 1135   ALKPHOS 72 08/02/2013 1135   BILITOT 0.7 08/02/2013 1135   GFRNONAA 79* 08/04/2013 0453   GFRAA >90 08/04/2013 0453   Lab Results  Component Value Date   CHOL 123 08/03/2013   HDL 76 08/03/2013   LDLCALC 40 08/03/2013   TRIG 33 08/03/2013   CHOLHDL 1.6 08/03/2013   Lab Results  Component Value Date   HGBA1C 5.1 08/02/2013    ASSESSMENT: Mr. ARNAV CREGG is a 80 y.o. male presenting with worsening of left sided weakness, dysphagia, and dysarthria in the setting of old left HP. Imaging confirmed a new left thalamic infarct on 10/12/12. Infarct felt to be thrombotic secondary to small vessel disease. Patient with resultant dysarthria, left hemiparesis is back to baseline. He has had 2 instances of altered mental status and left-sided weakness since last visit, evaluated both times at the hospital with dx of TIA vs. Seizure.  He was started on Vimpat with no further spells since late July.   PLAN:  Continue aspirin 81 mg orally every day and clopidogrel 75 mg orally every day for secondary stroke prevention and maintain strict control of hypertension with blood pressure goal below 140/90, lipids with LDL cholesterol goal below 100 mg/dL. Continue Vimpat at this time as spells are highly suspicious for partial seizure.  Repeat EEG. Return in about 6 months with Dr. Leonie Man, sooner as needed.  Orders Placed This Encounter  Procedures  . EEG adult   Rudi Rummage LAM, MSN, FNP-BC, A/GNP-C 10/05/2013, 12:30 PM [facility] [address] [place], [place] [address] [phone]  Note: This document was prepared with digital dictation and possible smart phrase technology. Any transcriptional errors that result from this process are unintentional.

## 2013-10-02 NOTE — Patient Instructions (Addendum)
PLAN:  Continue Vimpat for seizure prevention.  We will do a follow up EEG in our office another day, someone will call to schedule this test. Continue aspirin 81 mg orally every day and clopidogrel 75 mg orally every day for secondary stroke prevention and maintain strict control of hypertension with blood pressure goal below 140/90, lipids with LDL cholesterol goal below 100 mg/dL.  Return in about 6 months with Dr. Leonie Man, sooner as needed.  Stroke Prevention Some medical conditions and behaviors are associated with an increased chance of having a stroke. You may prevent a stroke by making healthy choices and managing medical conditions. HOW CAN I REDUCE MY RISK OF HAVING A STROKE?   Stay physically active. Get at least 30 minutes of activity on most or all days.  Do not smoke. It may also be helpful to avoid exposure to secondhand smoke.  Limit alcohol use. Moderate alcohol use is considered to be:  No more than 2 drinks per day for men.  No more than 1 drink per day for nonpregnant women.  Eat healthy foods. This involves:  Eating 5 or more servings of fruits and vegetables a day.  Making dietary changes that address high blood pressure (hypertension), high cholesterol, diabetes, or obesity.  Manage your cholesterol levels.  Making food choices that are high in fiber and low in saturated fat, trans fat, and cholesterol may control cholesterol levels.  Take any prescribed medicines to control cholesterol as directed by your health care provider.  Manage your diabetes.  Controlling your carbohydrate and sugar intake is recommended to manage diabetes.  Take any prescribed medicines to control diabetes as directed by your health care provider.  Control your hypertension.  Making food choices that are low in salt (sodium), saturated fat, trans fat, and cholesterol is recommended to manage hypertension.  Take any prescribed medicines to control hypertension as directed by your  health care provider.  Maintain a healthy weight.  Reducing calorie intake and making food choices that are low in sodium, saturated fat, trans fat, and cholesterol are recommended to manage weight.  Stop drug abuse.  Avoid taking birth control pills.  Talk to your health care provider about the risks of taking birth control pills if you are over 65 years old, smoke, get migraines, or have ever had a blood clot.  Get evaluated for sleep disorders (sleep apnea).  Talk to your health care provider about getting a sleep evaluation if you snore a lot or have excessive sleepiness.  Take medicines only as directed by your health care provider.  For some people, aspirin or blood thinners (anticoagulants) are helpful in reducing the risk of forming abnormal blood clots that can lead to stroke. If you have the irregular heart rhythm of atrial fibrillation, you should be on a blood thinner unless there is a good reason you cannot take them.  Understand all your medicine instructions.  Make sure that other conditions (such as anemia or atherosclerosis) are addressed. SEEK IMMEDIATE MEDICAL CARE IF:   You have sudden weakness or numbness of the face, arm, or leg, especially on one side of the body.  Your face or eyelid droops to one side.  You have sudden confusion.  You have trouble speaking (aphasia) or understanding.  You have sudden trouble seeing in one or both eyes.  You have sudden trouble walking.  You have dizziness.  You have a loss of balance or coordination.  You have a sudden, severe headache with no known cause.  You have new chest pain or an irregular heartbeat. Any of these symptoms may represent a serious problem that is an emergency. Do not wait to see if the symptoms will go away. Get medical help at once. Call your local emergency services (911 in [place] Do not drive yourself to the hospital. Document Released: 01/29/2004 Document Revised: 05/07/2013 Document  Reviewed: 06/23/2012 [facility] Patient Information 2015 [place], [place]. This information is not intended to replace advice given to you by your health care provider. Make sure you discuss any questions you have with your health care provider.

## 2013-10-04 ENCOUNTER — Encounter (HOSPITAL_COMMUNITY): Payer: Self-pay | Admitting: Emergency Medicine

## 2013-10-04 ENCOUNTER — Emergency Department (HOSPITAL_COMMUNITY)
Admission: EM | Admit: 2013-10-04 | Discharge: 2013-10-04 | Disposition: A | Discharge status: Home / Self Care | Payer: PRIVATE HEALTH INSURANCE | Attending: Emergency Medicine | Admitting: Emergency Medicine

## 2013-10-04 ENCOUNTER — Emergency Department (HOSPITAL_COMMUNITY): Payer: PRIVATE HEALTH INSURANCE

## 2013-10-04 ENCOUNTER — Other Ambulatory Visit: Payer: Medicare Other | Admitting: Radiology

## 2013-10-04 DIAGNOSIS — Z87891 Personal history of nicotine dependence: Secondary | ICD-10-CM | POA: Insufficient documentation

## 2013-10-04 DIAGNOSIS — W19XXXA Unspecified fall, initial encounter: Secondary | ICD-10-CM

## 2013-10-04 DIAGNOSIS — Z7982 Long term (current) use of aspirin: Secondary | ICD-10-CM | POA: Diagnosis not present

## 2013-10-04 DIAGNOSIS — I5032 Chronic diastolic (congestive) heart failure: Secondary | ICD-10-CM | POA: Insufficient documentation

## 2013-10-04 DIAGNOSIS — J449 Chronic obstructive pulmonary disease, unspecified: Secondary | ICD-10-CM | POA: Insufficient documentation

## 2013-10-04 DIAGNOSIS — H5412 Blindness, left eye, low vision right eye: Secondary | ICD-10-CM | POA: Diagnosis not present

## 2013-10-04 DIAGNOSIS — Z862 Personal history of diseases of the blood and blood-forming organs and certain disorders involving the immune mechanism: Secondary | ICD-10-CM | POA: Insufficient documentation

## 2013-10-04 DIAGNOSIS — I10 Essential (primary) hypertension: Secondary | ICD-10-CM | POA: Diagnosis not present

## 2013-10-04 DIAGNOSIS — Z8739 Personal history of other diseases of the musculoskeletal system and connective tissue: Secondary | ICD-10-CM | POA: Insufficient documentation

## 2013-10-04 DIAGNOSIS — Y9389 Activity, other specified: Secondary | ICD-10-CM | POA: Diagnosis not present

## 2013-10-04 DIAGNOSIS — E780 Pure hypercholesterolemia: Secondary | ICD-10-CM | POA: Insufficient documentation

## 2013-10-04 DIAGNOSIS — W010XXA Fall on same level from slipping, tripping and stumbling without subsequent striking against object, initial encounter: Secondary | ICD-10-CM | POA: Insufficient documentation

## 2013-10-04 DIAGNOSIS — Y9289 Other specified places as the place of occurrence of the external cause: Secondary | ICD-10-CM | POA: Diagnosis not present

## 2013-10-04 DIAGNOSIS — S3992XA Unspecified injury of lower back, initial encounter: Secondary | ICD-10-CM | POA: Diagnosis present

## 2013-10-04 DIAGNOSIS — S0083XA Contusion of other part of head, initial encounter: Secondary | ICD-10-CM | POA: Diagnosis not present

## 2013-10-04 DIAGNOSIS — Z7902 Long term (current) use of antithrombotics/antiplatelets: Secondary | ICD-10-CM | POA: Diagnosis not present

## 2013-10-04 DIAGNOSIS — S60212A Contusion of left wrist, initial encounter: Secondary | ICD-10-CM | POA: Diagnosis not present

## 2013-10-04 DIAGNOSIS — Z79899 Other long term (current) drug therapy: Secondary | ICD-10-CM | POA: Insufficient documentation

## 2013-10-04 DIAGNOSIS — T148XXA Other injury of unspecified body region, initial encounter: Secondary | ICD-10-CM

## 2013-10-04 DIAGNOSIS — S40012A Contusion of left shoulder, initial encounter: Secondary | ICD-10-CM | POA: Insufficient documentation

## 2013-10-04 DIAGNOSIS — Z7951 Long term (current) use of inhaled steroids: Secondary | ICD-10-CM | POA: Diagnosis not present

## 2013-10-04 DIAGNOSIS — Z8674 Personal history of sudden cardiac arrest: Secondary | ICD-10-CM | POA: Diagnosis not present

## 2013-10-04 DIAGNOSIS — Z8669 Personal history of other diseases of the nervous system and sense organs: Secondary | ICD-10-CM | POA: Diagnosis not present

## 2013-10-04 LAB — URINALYSIS, ROUTINE W REFLEX MICROSCOPIC
Bilirubin Urine: NEGATIVE
GLUCOSE, UA: NEGATIVE mg/dL
HGB URINE DIPSTICK: NEGATIVE
Ketones, ur: NEGATIVE mg/dL
Leukocytes, UA: NEGATIVE
Nitrite: NEGATIVE
PH: 6 (ref 5.0–8.0)
Protein, ur: NEGATIVE mg/dL
SPECIFIC GRAVITY, URINE: 1.019 (ref 1.005–1.030)
Urobilinogen, UA: 1 mg/dL (ref 0.0–1.0)

## 2013-10-04 NOTE — ED Notes (Signed)
Bed: ER15 Expected date:  Expected time:  Means of arrival:  Comments: EMS 80 yo male with fall/skin tear left shoulder

## 2013-10-04 NOTE — ED Notes (Signed)
Patient and daughter at bedside states patient fell while walking without his walker this evening. Patient denies LOC. Patient has abrasion to left eyebrow, skin tear to left shoulder, and left wrist. No bleeding. Patient daughter denies patient taking blood thinners, states he takes baby asa, and is no longer taking Plavix.

## 2013-10-04 NOTE — Discharge Instructions (Signed)
We saw you in the ER after you had a fall. °All the imaging results are normal, no fractures seen. No evidence of brain bleed. °Please be very careful with walking, and do everything possible to prevent falls. ° ° °Contusion °A contusion is a deep bruise. Contusions are the result of an injury that caused bleeding under the skin. The contusion may turn blue, purple, or yellow. Minor injuries will give you a painless contusion, but more severe contusions may stay painful and swollen for a few weeks.  °CAUSES  °A contusion is usually caused by a blow, trauma, or direct force to an area of the body. °SYMPTOMS  °· Swelling and redness of the injured area. °· Bruising of the injured area. °· Tenderness and soreness of the injured area. °· Pain. °DIAGNOSIS  °The diagnosis can be made by taking a history and physical exam. An X-ray, CT scan, or MRI may be needed to determine if there were any associated injuries, such as fractures. °TREATMENT  °Specific treatment will depend on what area of the body was injured. In general, the best treatment for a contusion is resting, icing, elevating, and applying cold compresses to the injured area. Over-the-counter medicines may also be recommended for pain control. Ask your caregiver what the best treatment is for your contusion. °HOME CARE INSTRUCTIONS  °· Put ice on the injured area. °· Put ice in a plastic bag. °· Place a towel between your skin and the bag. °· Leave the ice on for 15-20 minutes, 3-4 times a day, or as directed by your health care provider. °· Only take over-the-counter or prescription medicines for pain, discomfort, or fever as directed by your caregiver. Your caregiver may recommend avoiding anti-inflammatory medicines (aspirin, ibuprofen, and naproxen) for 48 hours because these medicines may increase bruising. °· Rest the injured area. °· If possible, elevate the injured area to reduce swelling. °SEEK IMMEDIATE MEDICAL CARE IF:  °· You have increased bruising  or swelling. °· You have pain that is getting worse. °· Your swelling or pain is not relieved with medicines. °MAKE SURE YOU:  °· Understand these instructions. °· Will watch your condition. °· Will get help right away if you are not doing well or get worse. °Document Released: 09/30/2004 Document Revised: 12/26/2012 Document Reviewed: 10/26/2010 °ExitCare® Patient Information ©2015 [employer]. This information is not intended to replace advice given to you by your health care provider. Make sure you discuss any questions you have with your health care provider. ° °Fall Prevention and Home Safety °Falls cause injuries and can affect all age groups. It is possible to use preventive measures to significantly decrease the likelihood of falls. There are many simple measures which can make your home safer and prevent falls. °OUTDOORS °· Repair cracks and edges of walkways and driveways. °· Remove high doorway thresholds. °· Trim shrubbery on the main path into your home. °· Have good outside lighting. °· Clear walkways of tools, rocks, debris, and clutter. °· Check that handrails are not broken and are securely fastened. Both sides of steps should have handrails. °· Have leaves, snow, and ice cleared regularly. °· Use sand or salt on walkways during winter months. °· In the garage, clean up grease or oil spills. °BATHROOM °· Install night lights. °· Install grab bars by the toilet and in the tub and shower. °· Use non-skid mats or decals in the tub or shower. °· Place a plastic non-slip stool in the shower to sit on, if needed. °·   Keep floors dry and clean up all water on the floor immediately. °· Remove soap buildup in the tub or shower on a regular basis. °· Secure bath mats with non-slip, double-sided rug tape. °· Remove throw rugs and tripping hazards from the floors. °BEDROOMS °· Install night lights. °· Make sure a bedside light is easy to reach. °· Do not use oversized bedding. °· Keep a telephone by your  bedside. °· Have a firm chair with side arms to use for getting dressed. °· Remove throw rugs and tripping hazards from the floor. °KITCHEN °· Keep handles on pots and pans turned toward the center of the stove. Use back burners when possible. °· Clean up spills quickly and allow time for drying. °· Avoid walking on wet floors. °· Avoid hot utensils and knives. °· Position shelves so they are not too high or low. °· Place commonly used objects within easy reach. °· If necessary, use a sturdy step stool with a grab bar when reaching. °· Keep electrical cables out of the way. °· Do not use floor polish or wax that makes floors slippery. If you must use wax, use non-skid floor wax. °· Remove throw rugs and tripping hazards from the floor. °STAIRWAYS °· Never leave objects on stairs. °· Place handrails on both sides of stairways and use them. Fix any loose handrails. Make sure handrails on both sides of the stairways are as long as the stairs. °· Check carpeting to make sure it is firmly attached along stairs. Make repairs to worn or loose carpet promptly. °· Avoid placing throw rugs at the top or bottom of stairways, or properly secure the rug with carpet tape to prevent slippage. Get rid of throw rugs, if possible. °· Have an electrician put in a light switch at the top and bottom of the stairs. °OTHER FALL PREVENTION TIPS °· Wear low-heel or rubber-soled shoes that are supportive and fit well. Wear closed toe shoes. °· When using a stepladder, make sure it is fully opened and both spreaders are firmly locked. Do not climb a closed stepladder. °· Add color or contrast paint or tape to grab bars and handrails in your home. Place contrasting color strips on first and last steps. °· Learn and use mobility aids as needed. Install an electrical emergency response system. °· Turn on lights to avoid dark areas. Replace light bulbs that burn out immediately. Get light switches that glow. °· Arrange furniture to create clear  pathways. Keep furniture in the same place. °· Firmly attach carpet with non-skid or double-sided tape. °· Eliminate uneven floor surfaces. °· Select a carpet pattern that does not visually hide the edge of steps. °· Be aware of all pets. °OTHER HOME SAFETY TIPS °· Set the water temperature for 120° F (48.8° C). °· Keep emergency numbers on or near the telephone. °· Keep smoke detectors on every level of the home and near sleeping areas. °Document Released: 12/11/2001 Document Revised: 06/22/2011 Document Reviewed: 03/12/2011 °ExitCare® Patient Information ©2015 [employer]. This information is not intended to replace advice given to you by your health care provider. Make sure you discuss any questions you have with your health care provider. ° °

## 2013-10-04 NOTE — ED Notes (Signed)
Pt able to ambulate with two-person assist---- pt's daughter reports that pt uses a quad cane to walk but pt uses his wheelchair for mobility most of the time.  Pt's gait and balance very jerky and unsteady--- pt has left-sided weakness from his stroke; pt's daughter reports that this is pt's norm.

## 2013-10-04 NOTE — ED Notes (Addendum)
Per PTAR, patient fell at home, patient states he was standing when he fell. Patient was sitting in the bedroom in the floor when PTAR arrived. Patient daughter states she placed patient on a "pad" and "drug" him to the bedroom s/p fall. Patient accompanied by his daughter who states the patient's face is swollen, and is requesting patient be evaluated. Patient has no complaints of pain or injury. Patient has small abrasion to left shoulder and "skin tear" to left wrist. PTAR denies swelling to patient's face. Patient daughter drove and has patient's wheelchair in her car.

## 2013-10-04 NOTE — ED Notes (Signed)
After reviewing medications by pharm tech, patient daughter states the patient is taking Plavix.

## 2013-10-04 NOTE — ED Provider Notes (Signed)
CSN: [id number]     Arrival date & time 10/04/13  0034 History   First MD Initiated Contact with Patient 10/04/13 0043     Chief Complaint  Patient presents with  . Fall    abrasion to left shoulder and left wrist     (Consider location/radiation/quality/duration/timing/severity/associated sxs/prior Treatment) HPI Comments: Pt comes in with cc of fall. Pt was found on the floor by his daughter. Pt was in the living room w/o his walker. PT reports that he slipped and fell, and was on the floor for about 30 minutes. He denies any pain. There is no headache, loc, seizures, confusion, visual complains. Pt has bruises on the left side of his body.   Patient is a 80 y.o. male presenting with fall. The history is provided by the patient.  Fall Pertinent negatives include no chest pain, no abdominal pain, no headaches and no shortness of breath.    Past Medical History  Diagnosis Date  . Acquired musculoskeletal deformity of unspecified site   . Chronic diastolic heart failure     EF >= 50%  . HTN (hypertension), benign     systematic  . Pure hypercholesterolemia   . COPD (chronic obstructive pulmonary disease)   . Benign neoplasm of colon   . 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 Iron deficiency anemia, unspecified   . LFTs abnormal 3/06    LFTs wnl, Cr - 1.3 stable. 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 colonoscopy 3/05- adenomatous polyps, mild gastritis-diffuse interosseous skeletal hypertosis (DISH), h/o H. pylori s/p tx.  . Stroke   . Hemiparesis, left   . Aortic valve insufficiency   . CHF (congestive heart failure)   . 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 Cardiac catheterization  1/99    mild CAD; EF 35-40%  . Aortic valve replacement      with pericardial tissue valve   . Cardiac valve replacement    . Multiple tooth extractions     Family History  Problem Relation Age of Onset  . Colon cancer Neg Hx   . Heart disease Father    History  Substance Use Topics  . Smoking status: Former Smoker    Types: Cigarettes  . Smokeless tobacco: Never Used     Comment: 10/13/2012 "smoked a little bit a long time ago"  . Alcohol Use: Yes     Comment: 10/13/2012 "haven't had anything to drink for years"    Review of Systems  Constitutional: Negative for activity change and appetite change.  Respiratory: Negative for cough and shortness of breath.   Cardiovascular: Negative for chest pain.  Gastrointestinal: Negative for abdominal pain.  Genitourinary: Negative for dysuria.  Musculoskeletal: Positive for back pain. Negative for arthralgias, gait problem and joint swelling.  Skin: Positive for wound.  Neurological: Negative for headaches.  Hematological: Does not bruise/bleed easily.      Allergies  Review of patient's allergies indicates no known allergies.  Home Medications   Prior to Admission medications   Medication Sig Start Date End Date Taking? Authorizing Provider  albuterol-ipratropium (COMBIVENT) 18-103 MCG/ACT inhaler Inhale 1 puff into the lungs every 4 (four) hours as needed for wheezing or shortness of breath.    Yes Historical Provider, MD  aspirin 81 MG tablet  Take 81 mg by mouth daily.   Yes Historical Provider, MD  atorvastatin (LIPITOR) 40 MG tablet Take 40 mg by mouth at bedtime.    Yes Historical Provider, MD  brimonidine (ALPHAGAN P) 0.1 % SOLN Place 1 drop into both eyes 2 (two) times daily.   Yes Historical Provider, MD  clopidogrel (PLAVIX) 75 MG tablet Take 75 mg by mouth daily.   Yes Historical Provider, MD  dorzolamide (TRUSOPT) 2 % ophthalmic solution Place 1 drop into both eyes 3 (three) times daily.   Yes Historical Provider, MD  Fluticasone-Salmeterol (ADVAIR DISKUS) 250-50 MCG/DOSE AEPB Inhale 2 puffs into the lungs  daily.    Yes Historical Provider, MD  lacosamide (VIMPAT) 50 MG TABS tablet Take 1 tablet (50 mg total) by mouth 2 (two) times daily. 08/04/13  Yes Maryann Mikhail, DO  losartan (COZAAR) 25 MG tablet Take 25 mg by mouth daily.   Yes Historical Provider, MD  Multiple Vitamin (MULTIVITAMIN) tablet Take 1 tablet by mouth daily.     Yes Historical Provider, MD  oxyCODONE-acetaminophen (PERCOCET) 5-325 MG per tablet Take 0.5-1 tablets by mouth every 4 (four) hours as needed for moderate pain.    Yes Historical Provider, MD  polyethylene glycol (MIRALAX / GLYCOLAX) packet Take 17 g by mouth daily.   Yes Historical Provider, MD  senna-docusate (SENOKOT-S) 8.6-50 MG per tablet Take 1 tablet by mouth daily.    Yes Historical Provider, MD  Solifenacin Succinate (VESICARE PO) Take 1 tablet by mouth every morning.   Yes Historical Provider, MD  furosemide (LASIX) 20 MG tablet Take 20 mg by mouth daily as needed for fluid or edema. For edema    Historical Provider, MD  potassium chloride (KLOR-CON) 20 MEQ packet Take 20 mEq by mouth daily as needed (for edema when taking lasix).     Historical Provider, MD   BP 129/74  Pulse 76  Temp(Src) 98.4 F (36.9 C) (Oral)  Resp 18  SpO2 95% Physical Exam  Nursing note and vitals reviewed. Constitutional: He is oriented to person, place, and time. He appears well-developed.  HENT:  Head: Normocephalic and atraumatic.  Left sided periorbital ecchymoses  Eyes: Conjunctivae and EOM are normal. Pupils are equal, round, and reactive to light.  Neck: Normal range of motion. Neck supple.  Cardiovascular: Normal rate and regular rhythm.   Pulmonary/Chest: Effort normal and breath sounds normal.  Abdominal: Soft. Bowel sounds are normal. He exhibits no distension. There is no tenderness. There is no rebound and no guarding.  Musculoskeletal:  Lumbar spine tenderness to palpation. Left shoulder and left wrist has ecchymoses and swelling, with no scaphoid tenderness.   Neurological: He is alert and oriented to person, place, and time.  Skin: Skin is warm. Rash noted.    ED Course  Procedures (including critical care time) Labs Review Labs Reviewed  URINALYSIS, ROUTINE W REFLEX MICROSCOPIC    Imaging Review Dg Lumbar Spine Complete  10/04/2013   CLINICAL DATA:  Fall, low back pain.  EXAM: LUMBAR SPINE - COMPLETE 4+ VIEW  COMPARISON:  Lumbar spine radiographs March 23, 2012  FINDINGS: Lumbar vertebral bodies appear intact. Relatively straightened lumbar lordosis. Severe L5-S1 disc degeneration with interbody arthrodesis. Moderate L3-4 and L4-5 disc degeneration. Bone mineral density is decreased without destructive bony lesions. No convincing evidence of pars interarticularis defects. Possible L5-S1 laminectomy. Mild broad dextroscoliosis is similar. Severe lower lumbar facet arthropathy.  Mild vascular calcifications.  IMPRESSION: Similar degenerative change without acute fracture deformity or malalignment.  Electronically Signed   By: Elon Alas   On: 10/04/2013 02:55   Dg Pelvis 1-2 Views  10/04/2013   ADDENDUM REPORT: 10/04/2013 02:56  COMPARISON:  LEFT hip radiograph March 23, 2012.   Electronically Signed   By: Elon Alas   On: 10/04/2013 02:56   10/04/2013   CLINICAL DATA:  Fall with pelvic pain.  Unable to straighten leg.  EXAM: PELVIS - 1-2 VIEW  COMPARISON:  None.  FINDINGS: There is no evidence of pelvic fracture or diastasis. No pelvic bone lesions are seen. Osteopenia. Mild vascular calcifications.  IMPRESSION: No acute fracture deformity or dislocation.  Osteopenia, decreases sensitivity for acute nondisplaced fractures.  Electronically Signed: By: Elon Alas On: 10/04/2013 02:51   Dg Wrist Complete Left  10/04/2013   CLINICAL DATA:  Fall, arm pain.  EXAM: LEFT WRIST - COMPLETE 3+ VIEW  COMPARISON:  None.  FINDINGS: No acute fracture deformity. No dislocation. No destructive bony lesions. Bone mineral density is decreased.  Moderate vascular calcifications. Flexed position of the wrist in all images.  IMPRESSION: No acute fracture deformity or dislocation though, wrist is flexed in all images limiting evaluation. Osteopenic, which decreases sensitivity for acute nondisplaced fractures.   Electronically Signed   By: Elon Alas   On: 10/04/2013 02:49   Ct Head Wo Contrast  10/04/2013   CLINICAL DATA:  Fall, left eyebrow abrasion  EXAM: CT HEAD WITHOUT CONTRAST  TECHNIQUE: Contiguous axial images were obtained from the base of the skull through the vertex without intravenous contrast.  COMPARISON:  08/02/2013  FINDINGS: No evidence of parenchymal hemorrhage or extra-axial fluid collection. No mass lesion, mass effect, or midline shift.  No CT evidence of acute infarction.  Encephalomalacic changes related to prior right frontoparietal infarct.  Subcortical white matter and periventricular small vessel ischemic changes. Intracranial atherosclerosis.  Global cortical atrophy. Ex vacuo dilatation of the right lateral ventricle without ventriculomegaly.  The visualized paranasal sinuses are essentially clear. The mastoid air cells are unopacified.  No evidence of calvarial fracture.  IMPRESSION: No evidence of acute intracranial abnormality.  Encephalomalacic changes related to prior right frontoparietal infarct.  Atrophy with small vessel ischemic changes and intracranial atherosclerosis.   Electronically Signed   By: Julian Hy M.D.   On: 10/04/2013 02:10   Dg Shoulder Left  10/04/2013   CLINICAL DATA:  Abrasions to the anterior shoulder after a fall.  EXAM: LEFT SHOULDER - 2+ VIEW  COMPARISON:  None.  FINDINGS: Diffuse bone demineralization. Degenerative changes in the glenohumeral joint and acromioclavicular joint. No evidence of acute fracture or dislocation in the left shoulder. Vascular calcifications in the soft tissues. Coracoclavicular and acromioclavicular spaces are not increased.  IMPRESSION: Diffuse bone  demineralization. Degenerative changes. No acute bony abnormalities.   Electronically Signed   By: Lucienne Capers M.D.   On: 10/04/2013 02:52     EKG Interpretation None      MDM   Final diagnoses:  Fall, initial encounter  Contusion    DDx includes: - Mechanical falls - ICH - Fractures - Contusions - Soft tissue injury  Pt comes in post fall. Based on exam findings - appropriate xrays were ordered. Results are neg for imaging. Pt ambulated with assistance. Stable for d/c.  Varney Biles, MD 10/04/13 [phone]

## 2013-10-04 NOTE — ED Notes (Signed)
Dr. Nanavati at bedside 

## 2013-10-04 NOTE — ED Notes (Signed)
MD aware patient has not voided at this time, no new orders

## 2013-10-05 NOTE — Progress Notes (Signed)
I agree with the above plan 

## 2013-10-11 ENCOUNTER — Encounter: Payer: Self-pay | Admitting: Cardiology

## 2013-10-11 ENCOUNTER — Ambulatory Visit (INDEPENDENT_AMBULATORY_CARE_PROVIDER_SITE_OTHER): Payer: PRIVATE HEALTH INSURANCE | Admitting: Cardiology

## 2013-10-11 VITALS — BP 100/50 | HR 60 | Ht 66.0 in | Wt 136.6 lb

## 2013-10-11 DIAGNOSIS — G458 Other transient cerebral ischemic attacks and related syndromes: Secondary | ICD-10-CM

## 2013-10-11 DIAGNOSIS — I10 Essential (primary) hypertension: Secondary | ICD-10-CM

## 2013-10-11 DIAGNOSIS — I420 Dilated cardiomyopathy: Secondary | ICD-10-CM | POA: Insufficient documentation

## 2013-10-11 NOTE — Progress Notes (Signed)
HPI: Aaron Johnson is a pleasant gentleman who has a history of aortic valve replacement with a pericardial tissue valve. His most recent echocardiogram was performed in July 2015 and showed EF 61-44, grade 1 diastolic dysfunction, AVR with elevated mean gradient of 28 mmHg and mild to moderate AI, mild RAE. Note, at the time of his aortic valve replacement a catheterization in 1999 showed no obstructive disease prior to the procedure. Carotid dopplers 7/15 showed bilaeral 1-39 stenosis. Holter monitorAugust 2015 showed sinus rhythm with frequent PVCs, occasional couplet and rare triplet. There was a 3.38 second pause. Seeing neurology for CVA/TIA. Since I last saw him, He apparently is losing weight and following. He denies dyspnea, chest pain or syncope.   Current Outpatient Prescriptions  Medication Sig Dispense Refill  . albuterol-ipratropium (COMBIVENT) 18-103 MCG/ACT inhaler Inhale 1 puff into the lungs every 4 (four) hours as needed for wheezing or shortness of breath.       Marland Kitchen aspirin 81 MG tablet Take 81 mg by mouth daily.      Marland Kitchen atorvastatin (LIPITOR) 40 MG tablet Take 40 mg by mouth at bedtime.       . brimonidine (ALPHAGAN P) 0.1 % SOLN Place 1 drop into both eyes 2 (two) times daily.      . clopidogrel (PLAVIX) 75 MG tablet Take 75 mg by mouth daily.      . dorzolamide (TRUSOPT) 2 % ophthalmic solution Place 1 drop into both eyes 3 (three) times daily.      . Fluticasone-Salmeterol (ADVAIR DISKUS) 250-50 MCG/DOSE AEPB Inhale 2 puffs into the lungs daily.       . furosemide (LASIX) 20 MG tablet Take 20 mg by mouth daily as needed for fluid or edema. For edema      . lacosamide (VIMPAT) 50 MG TABS tablet Take 1 tablet (50 mg total) by mouth 2 (two) times daily.  60 tablet  0  . losartan (COZAAR) 25 MG tablet Take 25 mg by mouth daily.      . Multiple Vitamin (MULTIVITAMIN) tablet Take 1 tablet by mouth daily.        Marland Kitchen oxyCODONE-acetaminophen (PERCOCET) 5-325 MG per tablet Take 0.5-1  tablets by mouth every 4 (four) hours as needed for moderate pain.       . polyethylene glycol (MIRALAX / GLYCOLAX) packet Take 17 g by mouth daily.      . potassium chloride (KLOR-CON) 20 MEQ packet Take 20 mEq by mouth daily as needed (for edema when taking lasix).       Marland Kitchen senna-docusate (SENOKOT-S) 8.6-50 MG per tablet Take 1 tablet by mouth daily.       . Solifenacin Succinate (VESICARE PO) Take 1 tablet by mouth every morning.       No current facility-administered medications for this visit.     Past Medical History  Diagnosis Date  . Acquired musculoskeletal deformity of unspecified site   . Chronic diastolic heart failure     EF >= 50%  . HTN (hypertension), benign     systematic  . Pure hypercholesterolemia   . COPD (chronic obstructive pulmonary disease)   . Benign neoplasm of colon   . 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 Iron deficiency anemia, unspecified   . LFTs abnormal 3/06    LFTs wnl, Cr - 1.3 stable. 4/06 likely TIA; pt may also have DISH syndrome ( thick c vertebrae), ataxic gait 2/2 to L hemiparesis, bx of scalp lesions=blue nevus (  benign), 6/04. colonoscopy 3/05- adenomatous polyps, mild gastritis-diffuse interosseous skeletal hypertosis (DISH), h/o H. pylori s/p tx.  . Stroke   . Hemiparesis, left   . Aortic valve insufficiency   . CHF (congestive heart failure)   . 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 Cardiac catheterization  1/99    mild CAD; EF 35-40%  . Aortic valve replacement      with pericardial tissue valve  . Cardiac valve replacement    . Multiple tooth extractions      History   Social History  . Marital Status: Widowed    Spouse Name: N/A    Number of Children: 4  . Years of Education: 7   Occupational History  . disabled    Social History Main Topics  . Smoking status: Former Smoker    Types: Cigarettes  . Smokeless tobacco: Never  Used     Comment: 10/13/2012 "smoked a little bit a long time ago"  . Alcohol Use: Yes     Comment: 10/13/2012 "haven't had anything to drink for years"  . Drug Use: No  . Sexual Activity: No   Other Topics Concern  . Not on file   Social History Narrative   Married to [place]all [place]; lives with daughter. Children involved in his life.     ROS: no fevers or chills, productive cough, hemoptysis, dysphasia, odynophagia, melena, hematochezia, dysuria, hematuria, rash, seizure activity, orthopnea, PND, pedal edema, claudication. Remaining systems are negative.  Physical Exam: Well-developed well-nourished in no acute distress.  Skin is warm and dry.  HEENT is normal.  Neck is supple.  Chest is clear to auscultation with normal expansion.  Cardiovascular exam is regular rate and rhythm. 2/6 systolic murmur left sternal border. No diastolic murmur. Abdominal exam nontender or distended. No masses palpated. Extremities show no edema. neuro grossly intact  08/02/2013-sinus rhythm, first degree AV block, septal infarct, nonspecific T-wave changes.

## 2013-10-11 NOTE — Assessment & Plan Note (Signed)
Continue SBE prophylaxis. He has deteriorated since I saw him previously. He is losing weight and falling. He has had a recent CVA. I initiated discussions of [facility]. They will consider.

## 2013-10-11 NOTE — Assessment & Plan Note (Signed)
Continue statin. 

## 2013-10-11 NOTE — Assessment & Plan Note (Signed)
Blood pressure controlled. Continue present medications. 

## 2013-10-11 NOTE — Patient Instructions (Signed)
Your physician wants you to follow-up in: 6 MONTHS WITH DR CRENSHAW You will receive a reminder letter in the mail two months in advance. If you don't receive a letter, please call our office to schedule the follow-up appointment.  

## 2013-10-11 NOTE — Assessment & Plan Note (Addendum)
LV function reduced on most recent echocardiogram. Continue ACE inhibitor. I will not add a beta blocker given recent pause on monitor and borderline blood pressure. I think conservative management is appropriate given his overall medical condition.

## 2013-10-22 ENCOUNTER — Other Ambulatory Visit: Payer: Medicare Other | Admitting: Radiology

## 2013-10-25 ENCOUNTER — Telehealth: Payer: Self-pay | Admitting: Radiology

## 2013-10-25 ENCOUNTER — Other Ambulatory Visit: Payer: Medicare Other | Admitting: Radiology

## 2013-10-25 NOTE — Telephone Encounter (Signed)
No show for EEG on 10/25/13.

## 2013-11-01 ENCOUNTER — Other Ambulatory Visit (INDEPENDENT_AMBULATORY_CARE_PROVIDER_SITE_OTHER): Payer: Medicare Other

## 2013-11-01 DIAGNOSIS — R569 Unspecified convulsions: Secondary | ICD-10-CM

## 2013-11-09 ENCOUNTER — Telehealth: Payer: Self-pay | Admitting: *Deleted

## 2013-11-09 NOTE — Telephone Encounter (Signed)
-----   Message from Philmore Pali, NP sent at 11/07/2013  9:50 AM EST ----- EEG does not show seizure activity. Please call patient.

## 2013-11-09 NOTE — Telephone Encounter (Signed)
Spoke with patient's wife, gave results, she would like to know if patient will need to continue with seizure medication, please advise.

## 2013-11-11 NOTE — Telephone Encounter (Signed)
I would like for him to stay on it until next follow up visit with Dr. Leonie Man.

## 2013-11-12 NOTE — Telephone Encounter (Signed)
Spoke with patient's wife and informed her of NP LL's message to stay on seizure medication til next visit with Dr Leonie Man, wife verbalized understanding and informed her of next appointment.

## 2013-11-24 ENCOUNTER — Emergency Department (HOSPITAL_COMMUNITY): Payer: PRIVATE HEALTH INSURANCE

## 2013-11-24 ENCOUNTER — Encounter (HOSPITAL_COMMUNITY): Payer: Self-pay | Admitting: Vascular Surgery

## 2013-11-24 ENCOUNTER — Inpatient Hospital Stay (HOSPITAL_COMMUNITY)
Admission: EM | Admit: 2013-11-24 | Discharge: 2013-11-26 | DRG: 086 | Disposition: A | Discharge status: Home / Self Care | Payer: PRIVATE HEALTH INSURANCE | Attending: Internal Medicine | Admitting: Internal Medicine

## 2013-11-24 DIAGNOSIS — I2510 Atherosclerotic heart disease of native coronary artery without angina pectoris: Secondary | ICD-10-CM | POA: Diagnosis not present

## 2013-11-24 DIAGNOSIS — Z8673 Personal history of transient ischemic attack (TIA), and cerebral infarction without residual deficits: Secondary | ICD-10-CM

## 2013-11-24 DIAGNOSIS — I5032 Chronic diastolic (congestive) heart failure: Secondary | ICD-10-CM | POA: Diagnosis not present

## 2013-11-24 DIAGNOSIS — F0391 Unspecified dementia with behavioral disturbance: Secondary | ICD-10-CM

## 2013-11-24 DIAGNOSIS — Z7982 Long term (current) use of aspirin: Secondary | ICD-10-CM

## 2013-11-24 DIAGNOSIS — Z87891 Personal history of nicotine dependence: Secondary | ICD-10-CM | POA: Diagnosis not present

## 2013-11-24 DIAGNOSIS — Z8249 Family history of ischemic heart disease and other diseases of the circulatory system: Secondary | ICD-10-CM | POA: Diagnosis not present

## 2013-11-24 DIAGNOSIS — H540 Blindness, both eyes: Secondary | ICD-10-CM | POA: Diagnosis not present

## 2013-11-24 DIAGNOSIS — I351 Nonrheumatic aortic (valve) insufficiency: Secondary | ICD-10-CM | POA: Diagnosis present

## 2013-11-24 DIAGNOSIS — G40909 Epilepsy, unspecified, not intractable, without status epilepticus: Secondary | ICD-10-CM | POA: Diagnosis not present

## 2013-11-24 DIAGNOSIS — Z66 Do not resuscitate: Secondary | ICD-10-CM | POA: Diagnosis not present

## 2013-11-24 DIAGNOSIS — E780 Pure hypercholesterolemia: Secondary | ICD-10-CM | POA: Diagnosis present

## 2013-11-24 DIAGNOSIS — G8110 Spastic hemiplegia affecting unspecified side: Secondary | ICD-10-CM | POA: Diagnosis present

## 2013-11-24 DIAGNOSIS — S066X0A Traumatic subarachnoid hemorrhage without loss of consciousness, initial encounter: Principal | ICD-10-CM | POA: Diagnosis present

## 2013-11-24 DIAGNOSIS — W010XXA Fall on same level from slipping, tripping and stumbling without subsequent striking against object, initial encounter: Secondary | ICD-10-CM | POA: Diagnosis not present

## 2013-11-24 DIAGNOSIS — D509 Iron deficiency anemia, unspecified: Secondary | ICD-10-CM | POA: Diagnosis not present

## 2013-11-24 DIAGNOSIS — Z952 Presence of prosthetic heart valve: Secondary | ICD-10-CM

## 2013-11-24 DIAGNOSIS — J45909 Unspecified asthma, uncomplicated: Secondary | ICD-10-CM | POA: Diagnosis not present

## 2013-11-24 DIAGNOSIS — E785 Hyperlipidemia, unspecified: Secondary | ICD-10-CM | POA: Diagnosis present

## 2013-11-24 DIAGNOSIS — W19XXXA Unspecified fall, initial encounter: Secondary | ICD-10-CM

## 2013-11-24 DIAGNOSIS — J449 Chronic obstructive pulmonary disease, unspecified: Secondary | ICD-10-CM | POA: Diagnosis not present

## 2013-11-24 DIAGNOSIS — D126 Benign neoplasm of colon, unspecified: Secondary | ICD-10-CM | POA: Diagnosis present

## 2013-11-24 DIAGNOSIS — F03918 Unspecified dementia, unspecified severity, with other behavioral disturbance: Secondary | ICD-10-CM

## 2013-11-24 DIAGNOSIS — F0390 Unspecified dementia without behavioral disturbance: Secondary | ICD-10-CM | POA: Diagnosis present

## 2013-11-24 DIAGNOSIS — I10 Essential (primary) hypertension: Secondary | ICD-10-CM | POA: Diagnosis not present

## 2013-11-24 DIAGNOSIS — I619 Nontraumatic intracerebral hemorrhage, unspecified: Secondary | ICD-10-CM

## 2013-11-24 DIAGNOSIS — I629 Nontraumatic intracranial hemorrhage, unspecified: Secondary | ICD-10-CM

## 2013-11-24 LAB — COMPREHENSIVE METABOLIC PANEL
ALBUMIN: 3.6 g/dL (ref 3.5–5.2)
ALT: 20 U/L (ref 0–53)
AST: 19 U/L (ref 0–37)
Alkaline Phosphatase: 82 U/L (ref 39–117)
Anion gap: 11 (ref 5–15)
BUN: 28 mg/dL — ABNORMAL HIGH (ref 6–23)
CALCIUM: 8.9 mg/dL (ref 8.4–10.5)
CO2: 25 mEq/L (ref 19–32)
CREATININE: 0.82 mg/dL (ref 0.50–1.35)
Chloride: 105 mEq/L (ref 96–112)
GFR calc Af Amer: >90 mL/min (ref >90)
GFR calc non Af Amer: 78 mL/min — ABNORMAL LOW (ref >90)
Glucose, Bld: 81 mg/dL (ref 70–99)
Potassium: 4.1 mEq/L (ref 3.7–5.3)
Sodium: 141 mEq/L (ref 137–147)
TOTAL PROTEIN: 7.3 g/dL (ref 6.0–8.3)
Total Bilirubin: 0.5 mg/dL (ref 0.3–1.2)

## 2013-11-24 LAB — CBC
HCT: 33.4 % — ABNORMAL LOW (ref 39.0–52.0)
Hemoglobin: 10.5 g/dL — ABNORMAL LOW (ref 13.0–17.0)
MCH: 28.1 pg (ref 26.0–34.0)
MCHC: 31.4 g/dL (ref 30.0–36.0)
MCV: 89.3 fL (ref 78.0–100.0)
PLATELETS: 96 10*3/uL — AB (ref 150–400)
RBC: 3.74 MIL/uL — ABNORMAL LOW (ref 4.22–5.81)
RDW: 13.2 % (ref 11.5–15.5)
WBC: 5.7 10*3/uL (ref 4.0–10.5)

## 2013-11-24 LAB — PROTIME-INR
INR: 1.07 (ref 0.00–1.49)
PROTHROMBIN TIME: 14 s (ref 11.6–15.2)

## 2013-11-24 LAB — I-STAT TROPONIN, ED: TROPONIN I, POC: 0 ng/mL (ref 0.00–0.08)

## 2013-11-24 MED ORDER — LOSARTAN POTASSIUM 50 MG PO TABS
25.0000 mg | ORAL_TABLET | Freq: Every day | ORAL | Status: DC
  Administered 2013-11-25 – 2013-11-26 (×2): 25 mg via ORAL
  Filled 2013-11-24 (×2): qty 1

## 2013-11-24 MED ORDER — BRIMONIDINE TARTRATE 0.2 % OP SOLN
1.0000 [drp] | Freq: Three times a day (TID) | OPHTHALMIC | Status: DC
  Administered 2013-11-24 – 2013-11-25 (×4): 1 [drp] via OPHTHALMIC
  Filled 2013-11-24: qty 5

## 2013-11-24 MED ORDER — MOMETASONE FURO-FORMOTEROL FUM 100-5 MCG/ACT IN AERO
2.0000 | INHALATION_SPRAY | Freq: Two times a day (BID) | RESPIRATORY_TRACT | Status: DC
  Administered 2013-11-24 – 2013-11-26 (×4): 2 via RESPIRATORY_TRACT
  Filled 2013-11-24: qty 8.8

## 2013-11-24 MED ORDER — TETANUS-DIPHTH-ACELL PERTUSSIS 5-2.5-18.5 LF-MCG/0.5 IM SUSP
0.5000 mL | Freq: Once | INTRAMUSCULAR | Status: AC
Start: 2013-11-24 — End: 2013-11-24
  Administered 2013-11-24: 0.5 mL via INTRAMUSCULAR
  Filled 2013-11-24: qty 0.5

## 2013-11-24 MED ORDER — IPRATROPIUM-ALBUTEROL 0.5-2.5 (3) MG/3ML IN SOLN: 3.0000 mL | RESPIRATORY_TRACT | Status: DC | PRN

## 2013-11-24 MED ORDER — ONDANSETRON HCL 4 MG PO TABS: 4.0000 mg | ORAL_TABLET | Freq: Four times a day (QID) | ORAL | Status: DC | PRN

## 2013-11-24 MED ORDER — ACETAMINOPHEN 650 MG RE SUPP: 650.0000 mg | Freq: Four times a day (QID) | RECTAL | Status: DC | PRN

## 2013-11-24 MED ORDER — OXYCODONE-ACETAMINOPHEN 5-325 MG PO TABS
0.5000 | ORAL_TABLET | ORAL | Status: DC | PRN
  Administered 2013-11-24 – 2013-11-25 (×2): 1 via ORAL
  Filled 2013-11-24 (×2): qty 1

## 2013-11-24 MED ORDER — DARIFENACIN HYDROBROMIDE ER 7.5 MG PO TB24
7.5000 mg | ORAL_TABLET | Freq: Every day | ORAL | Status: DC
  Administered 2013-11-25 – 2013-11-26 (×2): 7.5 mg via ORAL
  Filled 2013-11-24 (×2): qty 1

## 2013-11-24 MED ORDER — ONE-DAILY MULTI VITAMINS PO TABS: 1.0000 | ORAL_TABLET | Freq: Every day | ORAL | Status: DC

## 2013-11-24 MED ORDER — ATORVASTATIN CALCIUM 40 MG PO TABS
40.0000 mg | ORAL_TABLET | Freq: Every day | ORAL | Status: DC
  Administered 2013-11-24 – 2013-11-25 (×2): 40 mg via ORAL
  Filled 2013-11-24 (×2): qty 1

## 2013-11-24 MED ORDER — ONDANSETRON HCL 4 MG/2ML IJ SOLN: 4.0000 mg | Freq: Four times a day (QID) | INTRAMUSCULAR | Status: DC | PRN

## 2013-11-24 MED ORDER — IPRATROPIUM-ALBUTEROL 18-103 MCG/ACT IN AERO: 1.0000 | INHALATION_SPRAY | RESPIRATORY_TRACT | Status: DC | PRN

## 2013-11-24 MED ORDER — ACETAMINOPHEN 325 MG PO TABS
650.0000 mg | ORAL_TABLET | Freq: Four times a day (QID) | ORAL | Status: DC | PRN
  Filled 2013-11-24: qty 2

## 2013-11-24 MED ORDER — DORZOLAMIDE HCL 2 % OP SOLN
1.0000 [drp] | Freq: Three times a day (TID) | OPHTHALMIC | Status: DC
  Administered 2013-11-24 – 2013-11-26 (×5): 1 [drp] via OPHTHALMIC
  Filled 2013-11-24: qty 10

## 2013-11-24 MED ORDER — LATANOPROST 0.005 % OP SOLN
1.0000 [drp] | Freq: Every day | OPHTHALMIC | Status: DC
  Administered 2013-11-24 – 2013-11-25 (×2): 1 [drp] via OPHTHALMIC
  Filled 2013-11-24: qty 2.5

## 2013-11-24 MED ORDER — LACOSAMIDE 50 MG PO TABS
50.0000 mg | ORAL_TABLET | Freq: Two times a day (BID) | ORAL | Status: DC
  Administered 2013-11-24 – 2013-11-26 (×4): 50 mg via ORAL
  Filled 2013-11-24 (×4): qty 1

## 2013-11-24 MED ORDER — ADULT MULTIVITAMIN W/MINERALS CH
1.0000 | ORAL_TABLET | Freq: Every day | ORAL | Status: DC
  Administered 2013-11-25 – 2013-11-26 (×2): 1 via ORAL
  Filled 2013-11-24 (×2): qty 1

## 2013-11-24 MED ORDER — SODIUM CHLORIDE 0.9 % IJ SOLN
3.0000 mL | Freq: Two times a day (BID) | INTRAMUSCULAR | Status: DC
  Administered 2013-11-24 – 2013-11-26 (×3): 3 mL via INTRAVENOUS

## 2013-11-24 NOTE — H&P (Signed)
[facility] History and Physical  Aaron Johnson OZD:[id number] DOB: [birth year] DOA: 11/24/2013  Referring physician: er PCP: Kandice Hams, MD   Chief Complaint: fall  HPI: Aaron Johnson is a 80 y.o. male  With h/o CVA.  Lives with daughter and gets around with a walker.  He was walking down the hall, tried to turn and fell into the bathroom.  He hit his head on the sink.  Daughter reports to me no loss of consciousness nor seizure activity.  He was confused/"shocked" acting after the fall. No fever, no chills, in normal state of health before fall Patient has been having eye trouble- blind in right but daughter states patient has had a hard time opening his eyes.    Decrease in UPO since Vimpat added.   In the ER, patient received a head CT that showed Atrophy with small vessel chronic ischemic changes of deep cerebral white matter.  Large old RIGHT frontal parietal and RIGHT basal ganglia infarcts.  Foci of acute hemorrhage identified at the RIGHT frontoparietal region question subarachnoid versus gyriform. Patient is on ASA/plavix.  ER doc spoke with NS on call who states no intervention needed and patient can be observed overnight.    Review of Systems:  Unable to do as patient has dementia  Past Medical History  Diagnosis Date  . Acquired musculoskeletal deformity of unspecified site   . Chronic diastolic heart failure     EF >= 50%  . HTN (hypertension), benign     systematic  . Pure hypercholesterolemia   . COPD (chronic obstructive pulmonary disease)   . Benign neoplasm of colon   . 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 Iron deficiency anemia, unspecified   . LFTs abnormal 3/06    LFTs wnl, Cr - 1.3 stable. 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 colonoscopy 3/05- adenomatous polyps, mild gastritis-diffuse interosseous skeletal hypertosis  (DISH), h/o H. pylori s/p tx.  . Stroke   . Hemiparesis, left   . Aortic valve insufficiency   . CHF (congestive heart failure)   . 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 Cardiac catheterization  1/99    mild CAD; EF 35-40%  . Aortic valve replacement      with pericardial tissue valve  . Cardiac valve replacement    . Multiple tooth extractions     Social History:  reports that he has quit smoking. His smoking use included Cigarettes. He smoked 0.00 packs per day. He has never used smokeless tobacco. He reports that he drinks alcohol. He reports that he does not use illicit drugs.  No Known Allergies  Family History  Problem Relation Age of Onset  . Colon cancer Neg Hx   . Heart disease Father      Prior to Admission medications   Medication Sig Start Date End Date Taking? Authorizing Provider  albuterol-ipratropium (COMBIVENT) 18-103 MCG/ACT inhaler Inhale 1 puff into the lungs every 4 (four) hours as needed for wheezing or shortness of breath.    Yes Historical Provider, MD  aspirin 81 MG tablet Take 81 mg by mouth daily.   Yes Historical Provider, MD  atorvastatin (LIPITOR) 40 MG tablet Take 40 mg by mouth at bedtime.    Yes Historical Provider, MD  brimonidine [facility]  P) 0.1 % SOLN Place 1 drop into both eyes 2 (two) times daily.   Yes Historical Provider, MD  clopidogrel (PLAVIX) 75 MG tablet Take 75 mg by mouth daily.   Yes Historical Provider, MD  dorzolamide (TRUSOPT) 2 % ophthalmic solution Place 1 drop into both eyes 3 (three) times daily.   Yes Historical Provider, MD  Fluticasone-Salmeterol (ADVAIR DISKUS) 250-50 MCG/DOSE AEPB Inhale 2 puffs into the lungs daily.    Yes Historical Provider, MD  furosemide (LASIX) 20 MG tablet Take 20 mg by mouth daily as needed for fluid or edema. For edema   Yes Historical Provider, MD  lacosamide (VIMPAT) 50 MG TABS tablet Take 1 tablet (50 mg total)  by mouth 2 (two) times daily. 08/04/13  Yes Maryann Mikhail, DO  losartan (COZAAR) 25 MG tablet Take 25 mg by mouth daily.   Yes Historical Provider, MD  Multiple Vitamin (MULTIVITAMIN) tablet Take 1 tablet by mouth daily.     Yes Historical Provider, MD  oxyCODONE-acetaminophen (PERCOCET) 5-325 MG per tablet Take 0.5-1 tablets by mouth every 4 (four) hours as needed for moderate pain.    Yes Historical Provider, MD  polyethylene glycol (MIRALAX / GLYCOLAX) packet Take 17 g by mouth daily.   Yes Historical Provider, MD  potassium chloride (KLOR-CON) 20 MEQ packet Take 20 mEq by mouth daily as needed (for edema when taking lasix).    Yes Historical Provider, MD  senna-docusate (SENOKOT-S) 8.6-50 MG per tablet Take 1 tablet by mouth daily.    Yes Historical Provider, MD  Solifenacin Succinate (VESICARE PO) Take 1 tablet by mouth every morning.   Yes Historical Provider, MD  Travoprost, BAK Free, (TRAVATAN) 0.004 % SOLN ophthalmic solution Place 1 drop into both eyes at bedtime.   Yes Historical Provider, MD   Physical Exam: Filed Vitals:   11/24/13 1545 11/24/13 1645 11/24/13 1700 11/24/13 1715  BP: 147/78 145/77 163/67 164/58  Pulse: 53 65  68  Temp:      TempSrc:      Resp: 21 13 18 25   SpO2: 100% 100%  100%    Wt Readings from Last 3 Encounters:  10/11/13 61.961 kg (136 lb 9.6 oz)  10/02/13 62.959 kg (138 lb 12.8 oz)  08/02/13 63.231 kg (139 lb 6.4 oz)    General:  Appears calm and comfortable Eyes: conjuctiva red when patient will open eyes ENT: grossly normal hearing, lips & tongue Neck: no LAD, masses or thyromegaly Cardiovascular: RRR, no m/r/g. No LE edema. Respiratory: CTA bilaterally, no w/r/r. Normal respiratory effort. Abdomen: soft, ntnd Skin: left sided facial cut Psychiatric: mildly delayed speech Neurologic: left arm and leg are weak (old per record)          Labs on Admission:  Basic Metabolic Panel:  Recent Labs Lab 11/24/13 1543  NA 141  K 4.1  CL 105    CO2 25  GLUCOSE 81  BUN 28*  CREATININE 0.82  CALCIUM 8.9   Liver Function Tests:  Recent Labs Lab 11/24/13 1543  AST 19  ALT 20  ALKPHOS 82  BILITOT 0.5  PROT 7.3  ALBUMIN 3.6   No results for input(s): LIPASE, AMYLASE in the last 168 hours. No results for input(s): AMMONIA in the last 168 hours. CBC:  Recent Labs Lab 11/24/13 1543  WBC 5.7  HGB 10.5*  HCT 33.4*  MCV 89.3  PLT 96*   Cardiac Enzymes: No results for input(s): CKTOTAL, CKMB, CKMBINDEX, TROPONINI in the last 168 hours.  BNP (  last 3 results) No results for input(s): PROBNP in the last 8760 hours. CBG: No results for input(s): GLUCAP in the last 168 hours.  Radiological Exams on Admission: Ct Head Wo Contrast  11/24/2013   CLINICAL DATA:  [place] [address] today, history of blindness in RIGHT eye and partial blindness LEFT thigh, tripped and fell coming out of bathroom striking LEFT side of head, LEFT periorbital laceration, on Plavix, history stroke with residual speech deficits and LEFT side weakness, history hypertension, COPD, CHF  EXAM: CT HEAD WITHOUT CONTRAST  TECHNIQUE: Contiguous axial images were obtained from the base of the skull through the vertex without intravenous contrast.  COMPARISON:  10/04/2013  FINDINGS: Marked atrophy.  Ex vacuo dilatation of the RIGHT lateral ventricle anteriorly.  No midline shift.  Small vessel chronic ischemic changes of deep cerebral white matter.  Encephalomalacia secondary to old RIGHT frontal parietal infarct.  Old RIGHT basal ganglia infarcts.  Foci of high attenuation identified at the RIGHT frontal region new since previous exam consistent with acute intracranial hemorrhage question subarachnoid versus gyriform.  No subdural collections seen.  No evidence acute infarct or mass lesion.  Scattered motion artifacts limit exam.  No definite calvaria fracture.  IMPRESSION: Atrophy with small vessel chronic ischemic changes of deep cerebral white matter.  Large old RIGHT frontal  parietal and RIGHT basal ganglia infarcts.  Foci of acute hemorrhage identified at the RIGHT frontoparietal region question subarachnoid versus gyriform.  Critical Value/emergent results were called by telephone at the time of interpretation on 11/24/2013 at 1614 hr to Dr. Evelina Bucy , who verbally acknowledged these results.   Electronically Signed   By: Lavonia Dana M.D.   On: 11/24/2013 16:15    EKG: Independently reviewed. Sinus 68 with PVC  Assessment/Plan Active Problems:   Intracranial hemorrhage   Fall   Dementia   Fall with ICH- hold ASA/plavix, neuro checks.  PT eval in AM--family would like to take patient back home.  Dementia- at baseline, spoke with daughter about expectations and she would like to make him a DNR  H/o AVR- pericardial tissue valve   Code Status: DNR- discussed with daughter DVT Prophylaxis: Family Communication: daughter at bedside Disposition Plan: Time spent: 70 min  Eulogio Bear [facility] Pager [phone]

## 2013-11-24 NOTE — ED Notes (Addendum)
Pt reports to the ED via [facility] following a fall that occurred today. Pt is blind in his right eye and partially blind in his left eye. Pts family reports he got tripped up and fell whe he came out of the bathroom. He struck the left side of his head. Approx 2 cm laceration noted to his left periorbital area, bleeding controlled, edges well approximated. Pt denies any neck or back pain. Pt is on Plavix. He has a hx of a stroke with speech deficits and left sided weakness. No new neuro deficits noted. Pt alert and oriented at baseline. Resp e/u and skin warm and dry.

## 2013-11-24 NOTE — ED Provider Notes (Signed)
CSN: [id number]     Arrival date & time 11/24/13  1326 History   First MD Initiated Contact with Patient 11/24/13 1501     Chief Complaint  Patient presents with  . Fall     (Consider location/radiation/quality/duration/timing/severity/associated sxs/prior Treatment) HPI Comments: Was walking to the bathroom with daughter and he fell. She believes he tried to turn before he was supposed to an got tripped up. Hit his head. 2-3 minutes of loss of consciousness, no seizure like activity. Lives with daughter, acting normally now.  Patient is a 80 y.o. male presenting with fall. The history is provided by the patient.  Fall This is a new problem. The current episode started 1 to 2 hours ago. Episode frequency: once. The problem has been resolved. Pertinent negatives include no abdominal pain and no shortness of breath. Nothing aggravates the symptoms. Nothing relieves the symptoms.    Past Medical History  Diagnosis Date  . Acquired musculoskeletal deformity of unspecified site   . Chronic diastolic heart failure     EF >= 50%  . HTN (hypertension), benign     systematic  . Pure hypercholesterolemia   . COPD (chronic obstructive pulmonary disease)   . Benign neoplasm of colon   . 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 Iron deficiency anemia, unspecified   . LFTs abnormal 3/06    LFTs wnl, Cr - 1.3 stable. 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 colonoscopy 3/05- adenomatous polyps, mild gastritis-diffuse interosseous skeletal hypertosis (DISH), h/o H. pylori s/p tx.  . Stroke   . Hemiparesis, left   . Aortic valve insufficiency   . CHF (congestive heart failure)   . 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 Cardiac catheterization  1/99    mild CAD; EF 35-40%  . Aortic valve  replacement      with pericardial tissue valve  . Cardiac valve replacement    . Multiple tooth extractions     Family History  Problem Relation Age of Onset  . Colon cancer Neg Hx   . Heart disease Father    History  Substance Use Topics  . Smoking status: Former Smoker    Types: Cigarettes  . Smokeless tobacco: Never Used     Comment: 10/13/2012 "smoked a little bit a long time ago"  . Alcohol Use: Yes     Comment: 10/13/2012 "haven't had anything to drink for years"    Review of Systems  Constitutional: Negative for fever.  Respiratory: Negative for cough and shortness of breath.   Gastrointestinal: Negative for vomiting and abdominal pain.  All other systems reviewed and are negative.     Allergies  Review of patient's allergies indicates no known allergies.  Home Medications   Prior to Admission medications   Medication Sig Start Date End Date Taking? Authorizing Provider  albuterol-ipratropium (COMBIVENT) 18-103 MCG/ACT inhaler Inhale 1 puff into the lungs every 4 (four) hours as needed for wheezing or shortness of breath.    Yes Historical Provider, MD  aspirin 81 MG tablet Take 81 mg by mouth daily.   Yes Historical Provider, MD  atorvastatin (LIPITOR) 40 MG tablet Take 40 mg by mouth at bedtime.    Yes Historical Provider, MD  brimonidine (ALPHAGAN P) 0.1 % SOLN Place 1 drop  into both eyes 2 (two) times daily.   Yes Historical Provider, MD  clopidogrel (PLAVIX) 75 MG tablet Take 75 mg by mouth daily.   Yes Historical Provider, MD  dorzolamide (TRUSOPT) 2 % ophthalmic solution Place 1 drop into both eyes 3 (three) times daily.   Yes Historical Provider, MD  Fluticasone-Salmeterol (ADVAIR DISKUS) 250-50 MCG/DOSE AEPB Inhale 2 puffs into the lungs daily.    Yes Historical Provider, MD  furosemide (LASIX) 20 MG tablet Take 20 mg by mouth daily as needed for fluid or edema. For edema   Yes Historical Provider, MD  lacosamide (VIMPAT) 50 MG TABS tablet Take 1 tablet (50  mg total) by mouth 2 (two) times daily. 08/04/13  Yes Maryann Mikhail, DO  losartan (COZAAR) 25 MG tablet Take 25 mg by mouth daily.   Yes Historical Provider, MD  Multiple Vitamin (MULTIVITAMIN) tablet Take 1 tablet by mouth daily.     Yes Historical Provider, MD  oxyCODONE-acetaminophen (PERCOCET) 5-325 MG per tablet Take 0.5-1 tablets by mouth every 4 (four) hours as needed for moderate pain.    Yes Historical Provider, MD  polyethylene glycol (MIRALAX / GLYCOLAX) packet Take 17 g by mouth daily.   Yes Historical Provider, MD  potassium chloride (KLOR-CON) 20 MEQ packet Take 20 mEq by mouth daily as needed (for edema when taking lasix).    Yes Historical Provider, MD  senna-docusate (SENOKOT-S) 8.6-50 MG per tablet Take 1 tablet by mouth daily.    Yes Historical Provider, MD  Solifenacin Succinate (VESICARE PO) Take 1 tablet by mouth every morning.   Yes Historical Provider, MD  Travoprost, BAK Free, (TRAVATAN) 0.004 % SOLN ophthalmic solution Place 1 drop into both eyes at bedtime.   Yes Historical Provider, MD   BP 141/57 mmHg  Pulse 62  Temp(Src) 97.5 F (36.4 C) (Oral)  Resp 20  SpO2 100% Physical Exam  Constitutional: He appears well-developed and well-nourished. No distress.  HENT:  Head: Normocephalic.    Mouth/Throat: No oropharyngeal exudate, posterior oropharyngeal edema or posterior oropharyngeal erythema.  Eyes: EOM are normal. Pupils are equal, round, and reactive to light.  Neck: Normal range of motion. Neck supple.  Cardiovascular: Normal rate and regular rhythm.  Exam reveals no friction rub.   No murmur heard. Pulmonary/Chest: Effort normal and breath sounds normal. No respiratory distress. He has no wheezes. He has no rales.  Abdominal: He exhibits no distension. There is no tenderness. There is no rebound.  Musculoskeletal: Normal range of motion. He exhibits no edema.  Neurological: He is alert. No cranial nerve deficit or sensory deficit. He exhibits abnormal  muscle tone (L arm and leg weakness, chronic). GCS eye subscore is 4. GCS verbal subscore is 5. GCS motor subscore is 6.  Skin: He is not diaphoretic.  Nursing note and vitals reviewed.   ED Course  Procedures (including critical care time) Labs Review Labs Reviewed  CBC  COMPREHENSIVE METABOLIC PANEL  URINALYSIS, ROUTINE W REFLEX MICROSCOPIC  I-STAT  [place], ED    Imaging Review Ct Head Wo Contrast  11/24/2013   CLINICAL DATA:  [place] [address] today, history of blindness in RIGHT eye and partial blindness LEFT thigh, tripped and fell coming out of bathroom striking LEFT side of head, LEFT periorbital laceration, on Plavix, history stroke with residual speech deficits and LEFT side weakness, history hypertension, COPD, CHF  EXAM: CT HEAD WITHOUT CONTRAST  TECHNIQUE: Contiguous axial images were obtained from the base of the skull through the vertex without intravenous contrast.  COMPARISON:  10/04/2013  FINDINGS: Marked atrophy.  Ex vacuo dilatation of the RIGHT lateral ventricle anteriorly.  No midline shift.  Small vessel chronic ischemic changes of deep cerebral white matter.  Encephalomalacia secondary to old RIGHT frontal parietal infarct.  Old RIGHT basal ganglia infarcts.  Foci of high attenuation identified at the RIGHT frontal region new since previous exam consistent with acute intracranial hemorrhage question subarachnoid versus gyriform.  No subdural collections seen.  No evidence acute infarct or mass lesion.  Scattered motion artifacts limit exam.  No definite calvaria fracture.  IMPRESSION: Atrophy with small vessel chronic ischemic changes of deep cerebral white matter.  Large old RIGHT frontal parietal and RIGHT basal ganglia infarcts.  Foci of acute hemorrhage identified at the RIGHT frontoparietal region question subarachnoid versus gyriform.  Critical Value/emergent results were called by telephone at the time of interpretation on 11/24/2013 at 1614 hr to Dr. Evelina Bucy , who verbally  acknowledged these results.   Electronically Signed   By: Lavonia Dana M.D.   On: 11/24/2013 16:15     EKG Interpretation   Date/Time:  Saturday November 24 2013 15:46:23 EST Ventricular Rate:  68 PR Interval:  70 QRS Duration: 105 QT Interval:  447 QTC Calculation: 475 R Axis:   -11 Text Interpretation:  Sinus rhythm Ventricular premature complex Short PR  interval Left ventricular hypertrophy Anterior Q waves, possibly due to  LVH Borderline T abnormalities, inferior leads Similar to prior Confirmed  by Mingo Amber  MD, [place] ([id number]) on 11/24/2013 3:57:03 PM     CRITICAL CARE Performed by: Osvaldo Shipper   Total critical care time: 30 minutes  Critical care time was exclusive of separately billable procedures and treating other patients.  Critical care was necessary to treat or prevent imminent or life-threatening deterioration.  Critical care was time spent personally by me on the following activities: development of treatment plan with patient and/or surrogate as well as nursing, discussions with consultants, evaluation of patient's response to treatment, examination of patient, obtaining history from patient or surrogate, ordering and performing treatments and interventions, ordering and review of laboratory studies, ordering and review of radiographic studies, pulse oximetry and re-evaluation of patient's condition.  MDM   Final diagnoses:  Fall  Intracranial hemorrhage    80 year old male with history of aortic valve replacement, stroke presents with fall. Unclear if follows mechanical work patient had syncopal episode. He did hit the left side of his head has a small 2 cm well approximated laceration to the left lateral orbit. On exam he has some chronic left-sided weakness due to her prior stroke. He has no extraocular movement entrapment, pupils normal reactive bilaterally.  Patient lives with daughter, reports no chest pain or shortness of breath. Daughter confirms  this. No preceding symptoms to fall. Daughter did not see patient's face, was walking behind him. Will do workup and look for possible reason for fall or for trauma resulting from fall. CT head shows small SAH vs gyriform hemorrhage. Dr. Arnoldo Morale with Neurosurgery states nothing to do at this time. He was ok with observation. Dr. Eliseo Squires with hospitalists admitting.     Evelina Bucy, MD 11/24/13 2038

## 2013-11-25 ENCOUNTER — Encounter (HOSPITAL_COMMUNITY): Payer: Self-pay | Admitting: Radiology

## 2013-11-25 ENCOUNTER — Inpatient Hospital Stay (HOSPITAL_COMMUNITY): Payer: PRIVATE HEALTH INSURANCE

## 2013-11-25 ENCOUNTER — Observation Stay (HOSPITAL_COMMUNITY): Payer: PRIVATE HEALTH INSURANCE

## 2013-11-25 DIAGNOSIS — S06369A Traumatic hemorrhage of cerebrum, unspecified, with loss of consciousness of unspecified duration, initial encounter: Secondary | ICD-10-CM

## 2013-11-25 DIAGNOSIS — E785 Hyperlipidemia, unspecified: Secondary | ICD-10-CM

## 2013-11-25 DIAGNOSIS — Z66 Do not resuscitate: Secondary | ICD-10-CM | POA: Diagnosis present

## 2013-11-25 DIAGNOSIS — Z7982 Long term (current) use of aspirin: Secondary | ICD-10-CM | POA: Diagnosis not present

## 2013-11-25 DIAGNOSIS — D509 Iron deficiency anemia, unspecified: Secondary | ICD-10-CM | POA: Diagnosis present

## 2013-11-25 DIAGNOSIS — S066X0A Traumatic subarachnoid hemorrhage without loss of consciousness, initial encounter: Secondary | ICD-10-CM | POA: Diagnosis present

## 2013-11-25 DIAGNOSIS — I10 Essential (primary) hypertension: Secondary | ICD-10-CM

## 2013-11-25 DIAGNOSIS — W010XXA Fall on same level from slipping, tripping and stumbling without subsequent striking against object, initial encounter: Secondary | ICD-10-CM | POA: Diagnosis present

## 2013-11-25 DIAGNOSIS — D126 Benign neoplasm of colon, unspecified: Secondary | ICD-10-CM | POA: Diagnosis present

## 2013-11-25 DIAGNOSIS — I2510 Atherosclerotic heart disease of native coronary artery without angina pectoris: Secondary | ICD-10-CM | POA: Diagnosis present

## 2013-11-25 DIAGNOSIS — Z8673 Personal history of transient ischemic attack (TIA), and cerebral infarction without residual deficits: Secondary | ICD-10-CM | POA: Diagnosis not present

## 2013-11-25 DIAGNOSIS — F0390 Unspecified dementia without behavioral disturbance: Secondary | ICD-10-CM | POA: Diagnosis present

## 2013-11-25 DIAGNOSIS — G8110 Spastic hemiplegia affecting unspecified side: Secondary | ICD-10-CM | POA: Diagnosis present

## 2013-11-25 DIAGNOSIS — Z8249 Family history of ischemic heart disease and other diseases of the circulatory system: Secondary | ICD-10-CM | POA: Diagnosis not present

## 2013-11-25 DIAGNOSIS — H540 Blindness, both eyes: Secondary | ICD-10-CM | POA: Diagnosis present

## 2013-11-25 DIAGNOSIS — J449 Chronic obstructive pulmonary disease, unspecified: Secondary | ICD-10-CM | POA: Diagnosis present

## 2013-11-25 DIAGNOSIS — J45909 Unspecified asthma, uncomplicated: Secondary | ICD-10-CM | POA: Diagnosis present

## 2013-11-25 DIAGNOSIS — E780 Pure hypercholesterolemia: Secondary | ICD-10-CM | POA: Diagnosis present

## 2013-11-25 DIAGNOSIS — G40909 Epilepsy, unspecified, not intractable, without status epilepticus: Secondary | ICD-10-CM | POA: Diagnosis present

## 2013-11-25 DIAGNOSIS — Z952 Presence of prosthetic heart valve: Secondary | ICD-10-CM | POA: Diagnosis not present

## 2013-11-25 DIAGNOSIS — I351 Nonrheumatic aortic (valve) insufficiency: Secondary | ICD-10-CM | POA: Diagnosis present

## 2013-11-25 DIAGNOSIS — I5032 Chronic diastolic (congestive) heart failure: Secondary | ICD-10-CM | POA: Diagnosis present

## 2013-11-25 DIAGNOSIS — Z87891 Personal history of nicotine dependence: Secondary | ICD-10-CM | POA: Diagnosis not present

## 2013-11-25 LAB — URINALYSIS, ROUTINE W REFLEX MICROSCOPIC
Bilirubin Urine: NEGATIVE
Glucose, UA: 250 mg/dL — AB
Ketones, ur: 15 mg/dL — AB
LEUKOCYTES UA: NEGATIVE
Nitrite: NEGATIVE
Protein, ur: NEGATIVE mg/dL
SPECIFIC GRAVITY, URINE: 1.021 (ref 1.005–1.030)
UROBILINOGEN UA: 1 mg/dL (ref 0.0–1.0)
pH: 6 (ref 5.0–8.0)

## 2013-11-25 LAB — BASIC METABOLIC PANEL
Anion gap: 19 — ABNORMAL HIGH (ref 5–15)
BUN: 21 mg/dL (ref 6–23)
CO2: 18 meq/L — AB (ref 19–32)
Calcium: 8.7 mg/dL (ref 8.4–10.5)
Chloride: 101 mEq/L (ref 96–112)
Creatinine, Ser: 0.76 mg/dL (ref 0.50–1.35)
GFR calc Af Amer: >90 mL/min (ref >90)
GFR, EST NON AFRICAN AMERICAN: 81 mL/min — AB (ref >90)
Glucose, Bld: 122 mg/dL — ABNORMAL HIGH (ref 70–99)
POTASSIUM: 3.8 meq/L (ref 3.7–5.3)
Sodium: 138 mEq/L (ref 137–147)

## 2013-11-25 LAB — CBC
HEMATOCRIT: 32.4 % — AB (ref 39.0–52.0)
HEMOGLOBIN: 10.3 g/dL — AB (ref 13.0–17.0)
MCH: 27.8 pg (ref 26.0–34.0)
MCHC: 31.8 g/dL (ref 30.0–36.0)
MCV: 87.3 fL (ref 78.0–100.0)
PLATELETS: 90 10*3/uL — AB (ref 150–400)
RBC: 3.71 MIL/uL — AB (ref 4.22–5.81)
RDW: 13.1 % (ref 11.5–15.5)
WBC: 8 10*3/uL (ref 4.0–10.5)

## 2013-11-25 LAB — URINE MICROSCOPIC-ADD ON

## 2013-11-25 NOTE — Consult Note (Signed)
Reason for Consult: Right traumatic subarachnoid hemorrhage Referring Physician: Dr. Hulda Humphrey is an 80 y.o. male.  HPI: The patient is an 80 year old black male with a history of a prior right frontal cerebrovascular accident and chronic left dense hemiparesis. The patient took a fall yesterday. He was worked up in the emergency department with a head CT which demonstrated a small traumatic right subarachnoid hemorrhage. The patient was admitted for observation.  Presently the patient is accompanied by his daughter. He has no complaints. According to the patient's daughter he is at his baseline neurologic status.  Past Medical History  Diagnosis Date  . Acquired musculoskeletal deformity of unspecified site   . Chronic diastolic heart failure     EF >= 50%  . HTN (hypertension), benign     systematic  . Pure hypercholesterolemia   . COPD (chronic obstructive pulmonary disease)   . Benign neoplasm of colon   . 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 Iron deficiency anemia, unspecified   . LFTs abnormal 3/06    LFTs wnl, Cr - 1.3 stable. 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 colonoscopy 3/05- adenomatous polyps, mild gastritis-diffuse interosseous skeletal hypertosis (DISH), h/o H. pylori s/p tx.  . Stroke   . Hemiparesis, left   . Aortic valve insufficiency   . CHF (congestive heart failure)   . 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 Cardiac catheterization  1/99    mild CAD; EF 35-40%  . Aortic valve replacement      with pericardial tissue valve  . Cardiac valve replacement    . Multiple tooth extractions      Family History  Problem Relation Age of Onset  . Colon cancer Neg Hx   . Heart disease Father     Social History:  reports that he has quit  smoking. His smoking use included Cigarettes. He smoked 0.00 packs per day. He has never used smokeless tobacco. He reports that he drinks alcohol. He reports that he does not use illicit drugs.  Allergies: No Known Allergies  Medications:  I have reviewed the patient's current medications. Prior to Admission:  Prescriptions prior to admission  Medication Sig Dispense Refill Last Dose  . albuterol-ipratropium (COMBIVENT) 18-103 MCG/ACT inhaler Inhale 1 puff into the lungs every 4 (four) hours as needed for wheezing or shortness of breath.    Past Week at Unknown time  . aspirin 81 MG tablet Take 81 mg by mouth daily.   11/24/2013 at Unknown time  . atorvastatin (LIPITOR) 40 MG tablet Take 40 mg by mouth at bedtime.    11/23/2013 at Unknown time  . brimonidine (ALPHAGAN P) 0.1 % SOLN Place 1 drop into both eyes 2 (two) times daily.   11/24/2013 at Unknown time  . clopidogrel (PLAVIX) 75 MG tablet Take 75 mg by mouth daily.   11/24/2013 at Unknown time  . dorzolamide (TRUSOPT) 2 % ophthalmic solution Place 1 drop into both eyes 3 (three) times daily.   11/24/2013 at Unknown time  . Fluticasone-Salmeterol (ADVAIR DISKUS) 250-50 MCG/DOSE AEPB Inhale 2 puffs into the lungs daily.    11/23/2013 at Unknown time  . furosemide (LASIX) 20 MG tablet Take 20 mg by mouth daily as needed for fluid or edema. For  edema   Past Month at Unknown time  . lacosamide (VIMPAT) 50 MG TABS tablet Take 1 tablet (50 mg total) by mouth 2 (two) times daily. 60 tablet 0 11/24/2013 at Unknown time  . losartan (COZAAR) 25 MG tablet Take 25 mg by mouth daily.   11/24/2013 at Unknown time  . Multiple Vitamin (MULTIVITAMIN) tablet Take 1 tablet by mouth daily.     11/24/2013 at Unknown time  . oxyCODONE-acetaminophen (PERCOCET) 5-325 MG per tablet Take 0.5-1 tablets by mouth every 4 (four) hours as needed for moderate pain.    Past Month at Unknown time  . polyethylene glycol (MIRALAX / GLYCOLAX) packet Take 17 g by mouth daily.    11/24/2013 at Unknown time  . potassium chloride (KLOR-CON) 20 MEQ packet Take 20 mEq by mouth daily as needed (for edema when taking lasix).    Past Month at Unknown time  . senna-docusate (SENOKOT-S) 8.6-50 MG per tablet Take 1 tablet by mouth daily.    11/24/2013 at Unknown time  . Solifenacin Succinate (VESICARE PO) Take 1 tablet by mouth every morning.   Past Week at Unknown time  . Travoprost, BAK Free, (TRAVATAN) 0.004 % SOLN ophthalmic solution Place 1 drop into both eyes at bedtime.   11/23/2013 at Unknown time   Scheduled: . atorvastatin  40 mg Oral QHS  . brimonidine  1 drop Both Eyes 3 times per day  . darifenacin  7.5 mg Oral Daily  . dorzolamide  1 drop Both Eyes TID  . lacosamide  50 mg Oral BID  . latanoprost  1 drop Both Eyes QHS  . losartan  25 mg Oral Daily  . mometasone-formoterol  2 puff Inhalation BID  . multivitamin with minerals  1 tablet Oral Daily  . sodium chloride  3 mL Intravenous Q12H   Continuous:  [id number]:[id number] **OR** acetaminophen, ipratropium-albuterol, ondansetron **OR** ondansetron (ZOFRAN) IV, oxyCODONE-acetaminophen Anti-infectives    None       Results for orders placed or performed during the hospital encounter of 11/24/13 (from the past 48 hour(s))  CBC     Status: Abnormal   Collection Time: 11/24/13  3:43 PM  Result Value Ref Range   WBC 5.7 4.0 - 10.5 K/uL   RBC 3.74 (L) 4.22 - 5.81 MIL/uL   Hemoglobin 10.5 (L) 13.0 - 17.0 g/dL   HCT 33.4 (L) 39.0 - 52.0 %   MCV 89.3 78.0 - 100.0 fL   MCH 28.1 26.0 - 34.0 pg   MCHC 31.4 30.0 - 36.0 g/dL   RDW 13.2 11.5 - 15.5 %   Platelets 96 (L) 150 - 400 K/uL    Comment: PLATELET COUNT CONFIRMED BY SMEAR  Comprehensive metabolic panel     Status: Abnormal   Collection Time: 11/24/13  3:43 PM  Result Value Ref Range   Sodium 141 137 - 147 mEq/L   Potassium 4.1 3.7 - 5.3 mEq/L   Chloride 105 96 - 112 mEq/L   CO2 25 19 - 32 mEq/L   Glucose, Bld 81 70 - 99 mg/dL   BUN 28 (H) 6 - 23 mg/dL    Creatinine, Ser 0.82 0.50 - 1.35 mg/dL   Calcium 8.9 8.4 - 10.5 mg/dL   Total Protein 7.3 6.0 - 8.3 g/dL   Albumin 3.6 3.5 - 5.2 g/dL   AST 19 0 - 37 U/L   ALT 20 0 - 53 U/L   Alkaline Phosphatase 82 39 - 117 U/L   Total Bilirubin 0.5 0.3 - 1.2  mg/dL   GFR calc non Af Amer 78 (L) >90 mL/min   GFR calc Af Amer >90 >90 mL/min    Comment: (NOTE) The eGFR has been calculated using the CKD EPI equation. This calculation has not been validated in all clinical situations. eGFR's persistently <90 mL/min signify possible Chronic Kidney Disease.    Anion gap 11 5 - 15  I-stat troponin, ED     Status: None   Collection Time: 11/24/13  4:01 PM  Result Value Ref Range   Troponin i, poc 0.00 0.00 - 0.08 ng/mL   Comment 3            Comment: Due to the release kinetics of cTnI, a negative result within the first hours of the onset of symptoms does not rule out myocardial infarction with certainty. If myocardial infarction is still suspected, repeat the test at appropriate intervals.   Protime-INR     Status: None   Collection Time: 11/24/13  5:02 PM  Result Value Ref Range   Prothrombin Time 14.0 11.6 - 15.2 seconds   INR 1.07 0.00 - 1.49  Urinalysis, Routine w reflex microscopic     Status: Abnormal   Collection Time: 11/24/13 11:31 PM  Result Value Ref Range   Color, Urine YELLOW YELLOW   APPearance CLEAR CLEAR   Specific Gravity, Urine 1.021 1.005 - 1.030   pH 6.0 5.0 - 8.0   Glucose, UA 250 (A) NEGATIVE mg/dL   Hgb urine dipstick TRACE (A) NEGATIVE   Bilirubin Urine NEGATIVE NEGATIVE   Ketones, ur 15 (A) NEGATIVE mg/dL   Protein, ur NEGATIVE NEGATIVE mg/dL   Urobilinogen, UA 1.0 0.0 - 1.0 mg/dL   Nitrite NEGATIVE NEGATIVE   Leukocytes, UA NEGATIVE NEGATIVE  Urine microscopic-add on     Status: None   Collection Time: 11/24/13 11:31 PM  Result Value Ref Range   Squamous Epithelial / LPF RARE RARE   RBC / HPF 0-2 <3 RBC/hpf  Basic metabolic panel     Status: Abnormal    Collection Time: 11/25/13  4:58 AM  Result Value Ref Range   Sodium 138 137 - 147 mEq/L   Potassium 3.8 3.7 - 5.3 mEq/L   Chloride 101 96 - 112 mEq/L   CO2 18 (L) 19 - 32 mEq/L   Glucose, Bld 122 (H) 70 - 99 mg/dL   BUN 21 6 - 23 mg/dL   Creatinine, Ser 0.76 0.50 - 1.35 mg/dL   Calcium 8.7 8.4 - 10.5 mg/dL   GFR calc non Af Amer 81 (L) >90 mL/min   GFR calc Af Amer >90 >90 mL/min    Comment: (NOTE) The eGFR has been calculated using the CKD EPI equation. This calculation has not been validated in all clinical situations. eGFR's persistently <90 mL/min signify possible Chronic Kidney Disease.    Anion gap 19 (H) 5 - 15  CBC     Status: Abnormal   Collection Time: 11/25/13  4:58 AM  Result Value Ref Range   WBC 8.0 4.0 - 10.5 K/uL   RBC 3.71 (L) 4.22 - 5.81 MIL/uL   Hemoglobin 10.3 (L) 13.0 - 17.0 g/dL   HCT 32.4 (L) 39.0 - 52.0 %   MCV 87.3 78.0 - 100.0 fL   MCH 27.8 26.0 - 34.0 pg   MCHC 31.8 30.0 - 36.0 g/dL   RDW 13.1 11.5 - 15.5 %   Platelets 90 (L) 150 - 400 K/uL    Comment: CONSISTENT WITH PREVIOUS RESULT  Ct Head Wo Contrast  11/24/2013   CLINICAL DATA:  [place] today, history of blindness in RIGHT eye and partial blindness LEFT thigh, tripped and fell coming out of bathroom striking LEFT side of head, LEFT periorbital laceration, on Plavix, history stroke with residual speech deficits and LEFT side weakness, history hypertension, COPD, CHF  EXAM: CT HEAD WITHOUT CONTRAST  TECHNIQUE: Contiguous axial images were obtained from the base of the skull through the vertex without intravenous contrast.  COMPARISON:  10/04/2013  FINDINGS: Marked atrophy.  Ex vacuo dilatation of the RIGHT lateral ventricle anteriorly.  No midline shift.  Small vessel chronic ischemic changes of deep cerebral white matter.  Encephalomalacia secondary to old RIGHT frontal parietal infarct.  Old RIGHT basal ganglia infarcts.  Foci of high attenuation identified at the RIGHT frontal region new since  previous exam consistent with acute intracranial hemorrhage question subarachnoid versus gyriform.  No subdural collections seen.  No evidence acute infarct or mass lesion.  Scattered motion artifacts limit exam.  No definite calvaria fracture.  IMPRESSION: Atrophy with small vessel chronic ischemic changes of deep cerebral white matter.  Large old RIGHT frontal parietal and RIGHT basal ganglia infarcts.  Foci of acute hemorrhage identified at the RIGHT frontoparietal region question subarachnoid versus gyriform.  Critical Value/emergent results were called by telephone at the time of interpretation on 11/24/2013 at 1614 hr to Dr. Evelina Bucy , who verbally acknowledged these results.   Electronically Signed   By: Lavonia Dana M.D.   On: 11/24/2013 16:15    ROS as above Blood pressure 139/66, pulse 83, temperature 99.7 F (37.6 C), temperature source Oral, resp. rate 18, height _0  (1.676 m), weight 63.231 kg (139 lb 6.4 oz), SpO2 98 %. Physical Exam  General: A thin pleasant 80 year old black male in no apparent distress  HEENT: Normocephalic, the patient's pupils are approximately 4 mm bilaterally. He has conjugate gaze.  Neck: Supple  Thorax: Symmetric  Abdomen: Soft  Ms.: No obvious abnormalities  Back exam: Unremarkable  Neurologic exam: The patient is alert and oriented 3. Glasgow Coma Scale 15. The patient's motor strength is grossly normal his right upper and lower extremities. The patient is densely left hemiparetic more so in the upper extremity than the lower extremity. His pupils are equal as above. Sensation is grossly normal.  I have reviewed the patient's head CT performed yesterday. It demonstrates a small amount of traumatic subarachnoid hemorrhage in the region of his right encephalomalacia from his prior stroke. There is no mass effect.  Assessment/Plan: Traumatic subarachnoid hemorrhage: The patient is at his baseline. A repeat CAT scan has been planned. If it is  without changes of the patient discharge. I would stop his Plavix for a week. He can follow-up with his primary doctor. Please call if I can be of further assistance.  Dhanya Bogle D 11/25/2013, 10:28 AM   .

## 2013-11-25 NOTE — Progress Notes (Signed)
PATIENT DETAILS Name: Aaron Johnson Age: 80 y.o. Sex: male Date of Birth: 05-Dec-1926 Admit Date: 11/24/2013 Admitting Physician Geradine Girt, DO NFA:OZHYQM,VHQION D, MD  Subjective: No major issues overnight.  Assessment/Plan: Active Problems: Traumatic SAH: Following a mechanical fall. Has chronic left-sided deficits, per daughter at baseline. Aspirin, Plavix currently on hold, will repeat CT of the head today. If hematoma stable, will stop all antiplatelet agents for a week, then plan on only resuming aspirin. Briefly curb sided Dr. Fabiola Backer primary neurologist, who recommended only aspirin after a week or so.  History of CVA/TIA: Previously on aspirin and Plavix prior to admission. Currently on hold due to above. Would only plan on resuming aspirin in a week (see above). Continue with statin. Has spastic left hemiplegia at baseline-left upper extremity around 1/5, left lower extremity 3/5.  Hypertension: Stable, continue with losartan  History of seizure disorder: Continue with Vimpat.  Dyslipidemia: Continue with statin.  History of COPD/asthma: Lungs clear. Appears stable. Continue with as needed next.  Dementia: Suspect multi-infarct dementia. Current mental status at baseline.  History of aortic valve replacement-bioprosthetic valve.  Disposition: Remain inpatient-suspect home with PT when work up complete  Antibiotics:  Anti-infectives    None     DVT Prophylaxis: SCD's  Code Status:  DNR  Family Communication Daughter at bedside  Procedures:  None  CONSULTS:  Neurosurgery  Time spent 40 minutes-which includes 50% of the time with face-to-face with patient/ family and coordinating care related to the above assessment and plan.  MEDICATIONS: Scheduled Meds: . atorvastatin  40 mg Oral QHS  . brimonidine  1 drop Both Eyes 3 times per day  . darifenacin  7.5 mg Oral Daily  . dorzolamide  1 drop Both Eyes TID  . lacosamide  50 mg  Oral BID  . latanoprost  1 drop Both Eyes QHS  . losartan  25 mg Oral Daily  . mometasone-formoterol  2 puff Inhalation BID  . multivitamin with minerals  1 tablet Oral Daily  . sodium chloride  3 mL Intravenous Q12H   Continuous Infusions:  PRN Meds:.acetaminophen **OR** acetaminophen, ipratropium-albuterol, ondansetron **OR** ondansetron (ZOFRAN) IV, oxyCODONE-acetaminophen    PHYSICAL EXAM: Vital signs in last 24 hours: Filed Vitals:   11/24/13 2342 11/25/13 0225 11/25/13 0604 11/25/13 0740  BP:  126/53 139/66   Pulse:  72 83   Temp:  100 F (37.8 C) 99.7 F (37.6 C)   TempSrc:  Oral Oral   Resp:  24 18   Height: 5\' 6"  (1.676 m)     Weight: 63.231 kg (139 lb 6.4 oz)     SpO2:  100% 100% 98%    Weight change:  Filed Weights   11/24/13 2342  Weight: 63.231 kg (139 lb 6.4 oz)   Body mass index is 22.51 kg/(m^2).   Gen Exam: Awake and alert  Neck: Supple, No JVD.   Chest: B/L Clear.   CVS: S1 S2 Regular, no murmurs.  Abdomen: soft, BS +, non tender, non distended.  Extremities: no edema, lower extremities warm to touch. Neurologic:LUE 1/5, LLE 3/5. Right side- 5/5 Skin: No Rash.   Wounds: N/A.    Intake/Output from previous day:  Intake/Output Summary (Last 24 hours) at 11/25/13 1050 Last data filed at 11/24/13 2250  Gross per 24 hour  Intake      3 ml  Output      0 ml  Net      3  ml     LAB RESULTS: CBC  Recent Labs Lab 11/24/13 1543 11/25/13 0458  WBC 5.7 8.0  HGB 10.5* 10.3*  HCT 33.4* 32.4*  PLT 96* 90*  MCV 89.3 87.3  MCH 28.1 27.8  MCHC 31.4 31.8  RDW 13.2 13.1    Chemistries   Recent Labs Lab 11/24/13 1543 11/25/13 0458  NA 141 138  K 4.1 3.8  CL 105 101  CO2 25 18*  GLUCOSE 81 122*  BUN 28* 21  CREATININE 0.82 0.76  CALCIUM 8.9 8.7    CBG: No results for input(s): GLUCAP in the last 168 hours.  GFR Estimated Creatinine Clearance: 60.3 mL/min (by C-G formula based on Cr of 0.76).  Coagulation profile  Recent  Labs Lab 11/24/13 1702  INR 1.07    Cardiac Enzymes No results for input(s): CKMB, TROPONINI, MYOGLOBIN in the last 168 hours.  Invalid input(s): CK  Invalid input(s): POCBNP No results for input(s): DDIMER in the last 72 hours. No results for input(s): HGBA1C in the last 72 hours. No results for input(s): CHOL, HDL, LDLCALC, TRIG, CHOLHDL, LDLDIRECT in the last 72 hours. No results for input(s): TSH, T4TOTAL, T3FREE, THYROIDAB in the last 72 hours.  Invalid input(s): FREET3 No results for input(s): VITAMINB12, FOLATE, FERRITIN, TIBC, IRON, RETICCTPCT in the last 72 hours. No results for input(s): LIPASE, AMYLASE in the last 72 hours.  Urine Studies No results for input(s): UHGB, CRYS in the last 72 hours.  Invalid input(s): UACOL, UAPR, USPG, UPH, UTP, UGL, UKET, UBIL, UNIT, UROB, ULEU, UEPI, UWBC, URBC, UBAC, CAST, UCOM, BILUA  MICROBIOLOGY: No results found for this or any previous visit (from the past 240 hour(s)).  RADIOLOGY STUDIES/RESULTS: Ct Head Wo Contrast  11/24/2013   CLINICAL DATA:  [place] today, history of blindness in RIGHT eye and partial blindness LEFT thigh, tripped and fell coming out of bathroom striking LEFT side of head, LEFT periorbital laceration, on Plavix, history stroke with residual speech deficits and LEFT side weakness, history hypertension, COPD, CHF  EXAM: CT HEAD WITHOUT CONTRAST  TECHNIQUE: Contiguous axial images were obtained from the base of the skull through the vertex without intravenous contrast.  COMPARISON:  10/04/2013  FINDINGS: Marked atrophy.  Ex vacuo dilatation of the RIGHT lateral ventricle anteriorly.  No midline shift.  Small vessel chronic ischemic changes of deep cerebral white matter.  Encephalomalacia secondary to old RIGHT frontal parietal infarct.  Old RIGHT basal ganglia infarcts.  Foci of high attenuation identified at the RIGHT frontal region new since previous exam consistent with acute intracranial hemorrhage question  subarachnoid versus gyriform.  No subdural collections seen.  No evidence acute infarct or mass lesion.  Scattered motion artifacts limit exam.  No definite calvaria fracture.  IMPRESSION: Atrophy with small vessel chronic ischemic changes of deep cerebral white matter.  Large old RIGHT frontal parietal and RIGHT basal ganglia infarcts.  Foci of acute hemorrhage identified at the RIGHT frontoparietal region question subarachnoid versus gyriform.  Critical Value/emergent results were called by telephone at the time of interpretation on 11/24/2013 at 1614 hr to Dr. Evelina Bucy , who verbally acknowledged these results.   Electronically Signed   By: Lavonia Dana M.D.   On: 11/24/2013 16:15    Oren Binet, MD  [facility] Pager:[phone]  If 7PM-7AM, please contact night-coverage [email or web address] Password [email or web address] 11/25/2013, 10:50 AM   LOS: 1 day

## 2013-11-25 NOTE — Evaluation (Signed)
Clinical/Bedside Swallow Evaluation Patient Details  Name: Aaron Johnson MRN: [id number] Date of Birth: 05-18-1926  Today's Date: 11/25/2013 Time: 1810-1825 SLP Time Calculation (min) (ACUTE ONLY): 15 min  Past Medical History:  Past Medical History  Diagnosis Date  . Acquired musculoskeletal deformity of unspecified site   . Chronic diastolic heart failure     EF >= 50%  . HTN (hypertension), benign     systematic  . Pure hypercholesterolemia   . COPD (chronic obstructive pulmonary disease)   . Benign neoplasm of colon   . 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 Iron deficiency anemia, unspecified   . LFTs abnormal 3/06    LFTs wnl, Cr - 1.3 stable. 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 colonoscopy 3/05- adenomatous polyps, mild gastritis-diffuse interosseous skeletal hypertosis (DISH), h/o H. pylori s/p tx.  . Stroke   . Hemiparesis, left   . Aortic valve insufficiency   . CHF (congestive heart failure)   . 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 Cardiac catheterization  1/99    mild CAD; EF 35-40%  . Aortic valve replacement      with pericardial tissue valve  . Cardiac valve replacement    . Multiple tooth extractions     HPI:  80 yo male with PMH of dementia, COPD, CHF,  and CVA  was walking down the hall, tried to turn and fell into the bathroom. He hit his head on the sink. Daughter reports to me no loss of consciousness nor seizure activity. He was confused/"shocked" acting after the fall. Head CT shows foci of acute hemorrhage identified at the RIGHT frontoparietal region question subarachnoid versus gyriform. Patient with PMH of dysphagia, primary oral in nature with all three MBSS (10/14/12, 02/07/13, and 08/03/13) noting a significant delay in  swallow initiation but with relatively good airway protection with only trace and infrequent penetration observed. All recommended dysphagia 1 with thin liquids.    Assessment / Plan / Recommendation Clinical Impression  Swallowing function appears consistent with previous MBS results in which patient presented with prolonged but functional oral phase and what appears to be a delay in swallow initiation without any gross appearance of aspiration. Subtle throat clearing noted x1, suspect penetration which cleared. Discussed with daugther who reported that patient consumes soft solids at home. Although length of mastication time increased, feel this is appropriate for patient. No further SLP f/u indicated at this time for dysphagia. Recommend congitive-linguistic evaluation given new TBI.     Aspiration Risk  Mild    Diet Recommendation Dysphagia 3 (Mechanical Soft);Thin liquid   Liquid Administration via: Cup;Straw Medication Administration: Whole meds with liquid Supervision: Staff to assist with self feeding Compensations: Slow rate;Small sips/bites Postural Changes and/or Swallow Maneuvers: Seated upright 90 degrees    Other  Recommendations Oral Care Recommendations: Oral care BID   Follow Up Recommendations  None (for dysphagia)               Swallow Study    General HPI: 80 yo male with PMH of dementia, COPD, CHF,  and CVA  was walking down the hall, tried to turn and fell into the bathroom. He hit his head on the sink. Daughter reports to me no loss of consciousness  nor seizure activity. He was confused/"shocked" acting after the fall. Head CT shows foci of acute hemorrhage identified at the RIGHT frontoparietal region question subarachnoid versus gyriform. Patient with PMH of dysphagia, primary oral in nature with all three MBSS (10/14/12, 02/07/13, and 08/03/13) noting a significant delay in swallow initiation but with relatively good airway protection with only trace and  infrequent penetration observed. All recommended dysphagia 1 with thin liquids.  Type of Study: Bedside swallow evaluation Previous Swallow Assessment: none Diet Prior to this Study: Dysphagia 3 (soft);Thin liquids Temperature Spikes Noted: Yes Respiratory Status: Room air History of Recent Intubation: No Behavior/Cognition: Alert;Cooperative;Pleasant mood;Confused Oral Cavity - Dentition: Edentulous Self-Feeding Abilities: Needs assist Patient Positioning: Upright in chair Baseline Vocal Quality: Clear Volitional Cough: Strong Volitional Swallow: Able to elicit    Oral/Motor/Sensory Function Overall Oral Motor/Sensory Function: Impaired at baseline (?) Labial ROM: Within Functional Limits Labial Symmetry: Abnormal symmetry left Labial Strength: Within Functional Limits Lingual ROM: Reduced left Lingual Symmetry: Abnormal symmetry left Lingual Strength: Reduced Facial ROM: Within Functional Limits Facial Symmetry: Within Functional Limits Facial Strength: Within Functional Limits Velum: Within Functional Limits Mandible: Within Functional Limits   Ice Chips Ice chips: Not tested   Thin Liquid Thin Liquid: Impaired Presentation: Straw Pharyngeal  Phase Impairments: Suspected delayed Swallow;Multiple swallows;Throat Clearing - Immediate    Nectar Thick Nectar Thick Liquid: Not tested   Honey Thick Honey Thick Liquid: Not tested   Puree Puree: Within functional limits Presentation: [place] MA, CCC-SLP [phone]  Solid: Within functional limits Presentation: [place] 11/25/2013,6:25 PM

## 2013-11-25 NOTE — Progress Notes (Signed)
Pt complains of severe left arm pain when moved.  Pts arm is currently contracted secondary to a prior stroke.  Daughter concerned over pain and would like for MD to assess tomorrow.  Will make a note in the patient summary for MD to address.  Medication and heat packs were applied with little relief.

## 2013-11-25 NOTE — Progress Notes (Signed)
Utilization Review Completed.   Fannie Gathright, RN, BSN Nurse Case Manager  

## 2013-11-25 NOTE — Evaluation (Signed)
Physical Therapy Evaluation Patient Details Name: Aaron Johnson MRN: [id number] DOB: [birth year] Today's Date: 11/25/2013   History of Present Illness  80 yo male was walking down the hall, tried to turn and fell into the bathroom. He hit his head on the sink. Daughter reports to me no loss of consciousness nor seizure activity. He was confused/"shocked" acting after the fall.CT in Ed shows Atrophy with small vessel chronic ischemic changes of deep cerebral  Past Medical History  Diagnosis Date  . Acquired musculoskeletal deformity of unspecified site   . Chronic diastolic heart failure     EF >= 50%  . HTN (hypertension), benign     systematic  . Pure hypercholesterolemia   . COPD (chronic obstructive pulmonary disease)   . Benign neoplasm of colon   . 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 Iron deficiency anemia, unspecified   . LFTs abnormal 3/06    LFTs wnl, Cr - 1.3 stable. 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 colonoscopy 3/05- adenomatous polyps, mild gastritis-diffuse interosseous skeletal hypertosis (DISH), h/o H. pylori s/p tx.  . Stroke   . Hemiparesis, left   . Aortic valve insufficiency   . CHF (congestive heart failure)   . 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 Cardiac catheterization  1/99    mild CAD; EF 35-40%  . Aortic valve replacement      with pericardial tissue valve  . Cardiac valve replacement    . Multiple tooth extractions       Clinical Impression   Pt admitted with above. Pt currently with functional limitations due to the deficits listed below (see PT Problem List).  Pt will benefit from skilled PT to increase their independence and safety with mobility to allow discharge to the venue listed below.         Follow Up Recommendations Home  health PT;Supervision/Assistance - 24 hour;Other (comment) (postacute rehab would be optimal to maximize independence and safety with mobility, it is unlikely that pt and daughter would agree, and doubtful that insurance would cover it, given obs status )    Equipment Recommendations  Other (comment) (is there any way to get specialized lift equipment like the STEDY for home use?)    Recommendations for Other Services OT consult     Precautions / Restrictions Precautions Precautions: Fall;Other (comment) (significantly decr vision) Precaution Comments: L shoulder very painful Restrictions Weight Bearing Restrictions: No      Mobility  Bed Mobility Overal bed mobility: Needs Assistance Bed Mobility: Supine to Sit     Supine to sit: Max assist     General bed mobility comments: Required max physical assist to elevate trunk from bed while getting up on R side; Cues for technique, and to initiate with roll, and getting feet off of bed; once up, required at least mod assist to square off hips at EOB (used bed pad)  Transfers Overall transfer level: Needs assistance   Transfers: Sit to/from Stand;Stand Pivot Transfers Sit to Stand: Mod assist;From elevated surface Stand pivot transfers: Max assist;+2 safety/equipment       General transfer comment: Able to power up to stand using mostly RLE, however with significant posterior lean resulting in need for max support and assist to translate center of mass anteriorly over  feet; max assist to weight shift and take small pivot steps to recliner; second person present for safety; pt very hesitant to take steps  Ambulation/Gait   Ambulation Distance (Feet):  (small, pivot steps bed to chair only) Assistive device: Hemi-walker Gait Pattern/deviations: Decreased step length - right;Decreased step length - left;Decreased stance time - left;Decreased weight shift to left        Stairs            Wheelchair Mobility    Modified  Rankin (Stroke Patients Only)       Balance Overall balance assessment: Needs assistance Sitting-balance support: Bilateral upper extremity supported;Single extremity supported Sitting balance-Leahy Scale: Poor   Postural control: Posterior lean Standing balance support: Single extremity supported;During functional activity Standing balance-Leahy Scale: Zero Standing balance comment: significant posterior lean                             Pertinent Vitals/Pain Pain Assessment: Faces Faces Pain Scale: Hurts even more Pain Location: L shoulder with any ROM (even with elbow/upper arm supported); some pain with palpation; somewhat consistent with neuro shoulder; daughter reports pt fell on L side; MD notified Pain Descriptors / Indicators: Grimacing Pain Intervention(s): Limited activity within patient's tolerance;Monitored during session;Repositioned    Home Living Family/patient expects to be discharged to:: Private residence Living Arrangements: Children Available Help at Discharge: Family Type of Home: House Home Access: Ramped entrance;Stairs to enter Entrance Stairs-Rails: Right Entrance Stairs-Number of Steps: 4 Home Layout: One level Home Equipment: Wheelchair - manual;Hospital bed;Bedside commode Photographer) Additional Comments: has tub with "cut side" so he can get in/out easier    Prior Function Level of Independence: Needs assistance   Gait / Transfers Assistance Needed: short distances with hemiwalker  ADL's / Homemaking Assistance Needed: family assisted with ADL        Hand Dominance   Dominant Hand: Right    Extremity/Trunk Assessment   Upper Extremity Assessment: LUE deficits/detail       LUE Deficits / Details: Hemiparetic arm since remote stroke (per daughter over 20 years ago); L shoulder painful with any active or passive ROM   Lower Extremity Assessment: Generalized weakness;LLE deficits/detail   LLE Deficits / Details: Grossly  3/5 throughout with noted decr coordinationa nd tendency to use flexion pattern to lift LLE against gravity     Communication   Communication: Receptive difficulties;Other (comment);Expressive difficulties (Difficulty understanding pt's speech; had to repeat a lot)  Cognition Arousal/Alertness: Awake/alert (but with eyes closed greater part of session) Behavior During Therapy: WFL for tasks assessed/performed Overall Cognitive Status: History of cognitive impairments - at baseline                      General Comments      Exercises        Assessment/Plan    PT Assessment Patient needs continued PT services  PT Diagnosis Difficulty walking;Abnormality of gait;Generalized weakness;Acute pain;Hemiplegia non-dominant side   PT Problem List Decreased strength;Decreased range of motion;Decreased activity tolerance;Decreased balance;Decreased mobility;Decreased coordination;Decreased knowledge of use of DME;Pain;Decreased knowledge of precautions  PT Treatment Interventions DME instruction;Gait training;Functional mobility training;Therapeutic activities;Therapeutic exercise;Balance training;Neuromuscular re-education;Cognitive remediation;Patient/family education;Wheelchair mobility training   PT Goals (Current goals can be found in the Care Plan section) Acute Rehab PT Goals Patient Stated Goal: did not state PT Goal Formulation: With patient/family Time For Goal Achievement: 12/09/13 Potential to Achieve Goals: Fair    Frequency Min 3X/week  Barriers to discharge        Co-evaluation               End of Session Equipment Utilized During Treatment: Gait belt Activity Tolerance: Patient tolerated treatment well;Patient limited by fatigue Patient left: in chair;with call bell/phone within reach;with chair alarm set;with family/visitor present Nurse Communication: Mobility status;Need for lift equipment    Functional Assessment Tool Used: Clinical  Judgement Functional Limitation: Mobility: Walking and moving around Mobility: Walking and Moving Around Current Status [phone]): At least 60 percent but less than 80 percent impaired, limited or restricted Mobility: Walking and Moving Around Goal Status [phone]): At least 1 percent but less than 20 percent impaired, limited or restricted    Time: 0836-0928 PT Time Calculation (min) (ACUTE ONLY): 52 min   Charges:   PT Evaluation $Initial PT Evaluation Tier I: 1 Procedure PT Treatments $Therapeutic Activity: 38-52 mins   PT G Codes:   Functional Assessment Tool Used: Clinical Judgement Functional Limitation: Mobility: Walking and moving around    [place] 11/25/2013, 10:39 AM  Roney Marion, PT  Acute Rehabilitation Services Pager [phone] Office [phone]

## 2013-11-26 ENCOUNTER — Telehealth: Payer: Self-pay | Admitting: Neurology

## 2013-11-26 ENCOUNTER — Inpatient Hospital Stay (HOSPITAL_COMMUNITY): Payer: PRIVATE HEALTH INSURANCE

## 2013-11-26 DIAGNOSIS — R569 Unspecified convulsions: Secondary | ICD-10-CM

## 2013-11-26 MED ORDER — ASPIRIN 81 MG PO TABS: 81.0000 mg | ORAL_TABLET | Freq: Every day | ORAL | Status: AC

## 2013-11-26 NOTE — Progress Notes (Signed)
Talked to patient with daughter Levada Dy present about [facility] choices; daughter is the primary caregiver for the patient, Levada Dy chose [place]; [facility] with [place] called for arrangements; Aneta Mins [phone]

## 2013-11-26 NOTE — Progress Notes (Signed)
Discharge orders received.  Discharge instructions and follow-up appointments reviewed with the patient's daughter.  VSS.  IV removed and education complete.  Transported out via wheelchair, daughter present. Cori Razor, RN

## 2013-11-26 NOTE — Telephone Encounter (Signed)
Patient can see Dr Erlinda Hong  If he cannot wait to see me in january

## 2013-11-26 NOTE — Telephone Encounter (Signed)
Pt seen in the ER and needs to f/u with dr Leonie Man please call pt as Dr Leonie Man is out of the office til January.

## 2013-11-26 NOTE — Progress Notes (Signed)
Physical Therapy Treatment Patient Details Name: Aaron Johnson MRN: [id number] DOB: 1926/04/28 Today's Date: 11/26/2013    History of Present Illness 80 yo male was walking down the hall, tried to turn and fell into the bathroom. He hit his head on the sink. Daughter reports to me no loss of consciousness nor seizure activity. He was confused/"shocked" acting after the fall.CT in Ed shows Atrophy with small vessel chronic ischemic changes of deep cerebral    PT Comments    Attempted to use steady for transfer but patient unable to gain full upright standing thus SPT with +2 assist to recliner. Patients daughter present throughout session and stated that she does have assistance at home and is set up with equipment that is needed. She understand that at this time patient with be transfer to [facility] at home. Will continue with current POC  Follow Up Recommendations  Home health PT;Supervision/Assistance - 24 hour;Other (comment) (daughter declining SNF rehab)     Equipment Recommendations       Recommendations for Other Services       Precautions / Restrictions Precautions Precautions: Fall Precaution Comments: L shoulder very painful    Mobility  Bed Mobility Overal bed mobility: Needs Assistance Bed Mobility: Supine to Sit     Supine to sit: Max assist        Transfers Overall transfer level: Needs assistance   Transfers: Sit to/from Stand;Stand Pivot Transfers Sit to Stand: Mod assist;From elevated surface;+2 physical assistance Stand pivot transfers: Max assist;+2 safety/equipment       General transfer comment: Able to power up to stand using mostly RLE, however with significant posterior lean resulting in need for max support and assist to translate center of mass anteriorly over feet; Attempted to use steady to stand but unable to stand upright enough to shift seat flaps under patient. Able to SPT with +2 assist to recliner. Patient taking some small pivotal  steps  Ambulation/Gait                 Stairs            Wheelchair Mobility    Modified Rankin (Stroke Patients Only)       Balance     Sitting balance-Leahy Scale: Poor       Standing balance-Leahy Scale: Zero                      Cognition Arousal/Alertness: Awake/alert Behavior During Therapy: WFL for tasks assessed/performed Overall Cognitive Status: History of cognitive impairments - at baseline                      Exercises      General Comments        Pertinent Vitals/Pain Faces Pain Scale: Hurts even more Pain Location: L shoulder Pain Descriptors / Indicators: Sore Pain Intervention(s): Monitored during session    Home Living                      Prior Function            PT Goals (current goals can now be found in the care plan section) Progress towards PT goals: Progressing toward goals    Frequency  Min 3X/week    PT Plan Current plan remains appropriate    Co-evaluation             End of Session Equipment Utilized During Treatment: Gait belt Activity Tolerance: Patient tolerated  treatment well;Patient limited by fatigue Patient left: in chair;with call bell/phone within reach;with chair alarm set;with family/visitor present     Time: 0272-5366 PT Time Calculation (min) (ACUTE ONLY): 16 min  Charges:  $Therapeutic Activity: 8-22 mins                    G Codes:      Jacqualyn Posey 11/26/2013, 12:08 PM  11/26/2013 Jacqualyn Posey PTA [phone] pager [phone] office

## 2013-11-26 NOTE — Discharge Summary (Signed)
PATIENT DETAILS Name: Aaron Johnson Age: 80 y.o. Sex: male Date of Birth: Jun 12, 1926 MRN: [id number]. Admitting Physician: Geradine Girt, DO UUE:KCMKLK,JZPHXT D, MD  Admit Date: 11/24/2013 Discharge date: 11/26/2013  Recommendations for Outpatient Follow-up:  1. Stopping both aspirin and Plavix for a week, to restart aspirin in 1 week.  PRIMARY DISCHARGE DIAGNOSIS:  Active Problems:   Intracranial hemorrhage   Fall   Dementia   Intracerebral hemorrhage      PAST MEDICAL HISTORY: Past Medical History  Diagnosis Date  . Acquired musculoskeletal deformity of unspecified site   . Chronic diastolic heart failure     EF >= 50%  . HTN (hypertension), benign     systematic  . Pure hypercholesterolemia   . COPD (chronic obstructive pulmonary disease)   . Benign neoplasm of colon   . 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 Iron deficiency anemia, unspecified   . LFTs abnormal 3/06    LFTs wnl, Cr - 1.3 stable. 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 colonoscopy 3/05- adenomatous polyps, mild gastritis-diffuse interosseous skeletal hypertosis (DISH), h/o H. pylori s/p tx.  . Stroke   . Hemiparesis, left   . Aortic valve insufficiency   . CHF (congestive heart failure)   . 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 Resume after 7 days from 11/26/13 Qty: 30 tablet      CONTINUE these medications which have NOT CHANGED   Details  albuterol-ipratropium (COMBIVENT) 18-103 MCG/ACT inhaler Inhale 1 puff into the lungs every 4 (four) hours as needed for wheezing or shortness of breath.     atorvastatin (LIPITOR) 40 MG tablet Take 40 mg by mouth at  bedtime.     brimonidine (ALPHAGAN P) 0.1 % SOLN Place 1 drop into both eyes 2 (two) times daily.    dorzolamide (TRUSOPT) 2 % ophthalmic solution Place 1 drop into both eyes 3 (three) times daily.    Fluticasone-Salmeterol (ADVAIR DISKUS) 250-50 MCG/DOSE AEPB Inhale 2 puffs into the lungs daily.     furosemide (LASIX) 20 MG tablet Take 20 mg by mouth daily as needed for fluid or edema. For edema    lacosamide (VIMPAT) 50 MG TABS tablet Take 1 tablet (50 mg total) by mouth 2 (two) times daily. Qty: 60 tablet, Refills: 0    losartan (COZAAR) 25 MG tablet Take 25 mg by mouth daily.    Multiple Vitamin (MULTIVITAMIN) tablet Take 1 tablet by mouth daily.      oxyCODONE-acetaminophen (PERCOCET) 5-325 MG per tablet Take 0.5-1 tablets by mouth every 4 (four) hours as needed for moderate pain.     polyethylene glycol (MIRALAX / GLYCOLAX) packet Take 17 g by mouth daily.    potassium chloride (KLOR-CON) 20 MEQ packet Take 20 mEq by mouth daily as needed (for edema when taking lasix).     senna-docusate (SENOKOT-S) 8.6-50 MG per tablet Take 1 tablet by mouth daily.     Solifenacin Succinate (VESICARE PO) Take 1 tablet by mouth every morning.    Travoprost, BAK Free, (TRAVATAN) 0.004 % SOLN ophthalmic solution Place 1 drop into both eyes at bedtime.  STOP taking these medications     clopidogrel (PLAVIX) 75 MG tablet         ALLERGIES:  No Known Allergies  BRIEF HPI:  See H&P, Labs, Consult and Test reports for all details in brief, patient is 80 year old male with a history of prior CVA, chronic left sided hemiparesis who presented to the ED following a fall. CT of the head showed a traumatic right subarachnoid hemorrhage. Patient was admitted for evaluation and treatment  CONSULTATIONS:   Neurosurgery  PERTINENT RADIOLOGIC STUDIES: Ct Head Wo Contrast  11/26/2013   CLINICAL DATA:  80 year old male with intracranial hemorrhage after fall. Followup evaluation to assess  hemorrhage.  EXAM: CT HEAD WITHOUT CONTRAST  TECHNIQUE: Contiguous axial images were obtained from the base of the skull through the vertex without intravenous contrast.  COMPARISON:  The most recent prior head CT 11/25/2013  FINDINGS: No significant interval change in the appearance of multiple foci of high attenuation within the residual gyri in the right frontoparietal region in the area of encephalomalacia from a remote prior infarct. Slight differences and the appearance of the foci of hemorrhage likely represent redistribution of blood products rather than continued hemorrhage. No evidence of a new hemorrhage, intraventricular or subarachnoid hemorrhage. Stable asymmetric ventriculomegaly, atrophy and sequelae of chronic microvascular ischemic white matter disease. Remote lacunar infarcts in the bilateral basal ganglia. Remote right frontoparietal infarct likely involving the ACA and MCA distributions. No acute soft tissue or calvarial abnormality. Advanced degenerative change at the atlantodental interval noted on the inferior images. Atherosclerotic calcifications present in the bilateral cavernous and supra clinoid carotid arteries.  IMPRESSION: 1. Mild redistribution of blood products without significant interval change in the multiple foci of gyriform hemorrhage in the right frontoparietal cortex. 2. Otherwise, stable sequelae of remote prior right frontoparietal infarct with associated encephalomalacia, asymmetric ventriculomegaly, generalized atrophy, chronic microvascular ischemic white matter disease, bilateral basal ganglia lacunar infarcts and intracranial atherosclerotic calcifications.   Electronically Signed   By: Jacqulynn Cadet M.D.   On: 11/26/2013 09:48   Ct Head Wo Contrast  11/25/2013   CLINICAL DATA:  Intracranial hemorrhage, follow-up, history chronic diastolic heart failure, hypertension, COPD, stroke  EXAM: CT HEAD WITHOUT CONTRAST  TECHNIQUE: Contiguous axial images were obtained  from the base of the skull through the vertex without intravenous contrast.  COMPARISON:  11/24/2013  FINDINGS: Generalized atrophy.  Stable ventricular morphology with ex vacuo dilatation of the RIGHT lateral ventricle.  Large old RIGHT frontoparietal and RIGHT basal ganglia infarcts.  Foci of acute hemorrhage are identified at the RIGHT frontoparietal region consistent with acute hemorrhage, question gyriform or less likely subarachnoid.  A few of the small foci of hemorrhage are new since the previous exam.  No additional intracranial hemorrhage, mass lesion or new extra-axial collections.  Bones stable.  Atherosclerotic calcification is skullbase.  IMPRESSION: Atrophy with small vessel chronic ischemic changes of deep cerebral white matter and old infarcts involving the RIGHT frontal parietal lobes in RIGHT basal ganglia.  Foci of likely gyriform hemorrhage at the LEFT frontal and parietal lobes minimally increased since previous exam.   Electronically Signed   By: Lavonia Dana M.D.   On: 11/25/2013 11:38   Ct Head Wo Contrast  11/24/2013   CLINICAL DATA:  [place] today, history of blindness in RIGHT eye and partial blindness LEFT thigh, tripped and fell coming out of bathroom striking LEFT side of head, LEFT periorbital laceration, on Plavix, history stroke with residual speech deficits and LEFT side weakness, history  hypertension, COPD, CHF  EXAM: CT HEAD WITHOUT CONTRAST  TECHNIQUE: Contiguous axial images were obtained from the base of the skull through the vertex without intravenous contrast.  COMPARISON:  10/04/2013  FINDINGS: Marked atrophy.  Ex vacuo dilatation of the RIGHT lateral ventricle anteriorly.  No midline shift.  Small vessel chronic ischemic changes of deep cerebral white matter.  Encephalomalacia secondary to old RIGHT frontal parietal infarct.  Old RIGHT basal ganglia infarcts.  Foci of high attenuation identified at the RIGHT frontal region new since previous exam consistent with acute  intracranial hemorrhage question subarachnoid versus gyriform.  No subdural collections seen.  No evidence acute infarct or mass lesion.  Scattered motion artifacts limit exam.  No definite calvaria fracture.  IMPRESSION: Atrophy with small vessel chronic ischemic changes of deep cerebral white matter.  Large old RIGHT frontal parietal and RIGHT basal ganglia infarcts.  Foci of acute hemorrhage identified at the RIGHT frontoparietal region question subarachnoid versus gyriform.  Critical Value/emergent results were called by telephone at the time of interpretation on 11/24/2013 at 1614 hr to Dr. Evelina Bucy , who verbally acknowledged these results.   Electronically Signed   By: Lavonia Dana M.D.   On: 11/24/2013 16:15   Dg Shoulder Left  11/25/2013   CLINICAL DATA:  Left shoulder pain.  EXAM: LEFT SHOULDER - 2+ VIEW  COMPARISON:  10/04/2013  FINDINGS: There is moderate diffuse decreased bone mineralization. There are mild degenerative changes of the glenohumeral joint. There is no definite fracture or dislocation.  IMPRESSION: No acute findings.   Electronically Signed   By: Marin Olp M.D.   On: 11/25/2013 13:27     PERTINENT LAB RESULTS: CBC:  Recent Labs  11/24/13 1543 11/25/13 0458  WBC 5.7 8.0  HGB 10.5* 10.3*  HCT 33.4* 32.4*  PLT 96* 90*   CMET CMP     Component Value Date/Time   NA 138 11/25/2013 0458   K 3.8 11/25/2013 0458   CL 101 11/25/2013 0458   CO2 18* 11/25/2013 0458   GLUCOSE 122* 11/25/2013 0458   BUN 21 11/25/2013 0458   CREATININE 0.76 11/25/2013 0458   CALCIUM 8.7 11/25/2013 0458   PROT 7.3 11/24/2013 1543   ALBUMIN 3.6 11/24/2013 1543   AST 19 11/24/2013 1543   ALT 20 11/24/2013 1543   ALKPHOS 82 11/24/2013 1543   BILITOT 0.5 11/24/2013 1543   GFRNONAA 81* 11/25/2013 0458   GFRAA >90 11/25/2013 0458    GFR Estimated Creatinine Clearance: 60.3 mL/min (by C-G formula based on Cr of 0.76). No results for input(s): LIPASE, AMYLASE in the last 72  hours. No results for input(s): CKTOTAL, CKMB, CKMBINDEX, TROPONINI in the last 72 hours. Invalid input(s): POCBNP No results for input(s): DDIMER in the last 72 hours. No results for input(s): HGBA1C in the last 72 hours. No results for input(s): CHOL, HDL, LDLCALC, TRIG, CHOLHDL, LDLDIRECT in the last 72 hours. No results for input(s): TSH, T4TOTAL, T3FREE, THYROIDAB in the last 72 hours.  Invalid input(s): FREET3 No results for input(s): VITAMINB12, FOLATE, FERRITIN, TIBC, IRON, RETICCTPCT in the last 72 hours. Coags:  Recent Labs  11/24/13 1702  INR 1.07   Microbiology: No results found for this or any previous visit (from the past 240 hour(s)).   BRIEF HOSPITAL COURSE:   Active Problems: Traumatic SAH: Following a mechanical fall. Has chronic left-sided deficits, per daughter at baseline. Aspirin, Plavix placed on hold, CT scan of the head was repeated on 11/22 and 11/23. Repeat CT  scans of the head showed stability of the hematoma. Neurosurgery was consulted, current recommendations are to stop both aspirin and Plavix for a week. We also briefly curb sided Dr. Fabiola Backer primary neurologist, who reviewed her CT head images and recommended to resume only aspirin after a week.  History of CVA/TIA: Previously on aspirin and Plavix prior to admission. Currently on hold due to above. Would only plan on resuming aspirin in a week (see above). Continue with statin. Has spastic left hemiplegia at baseline-left upper extremity around 1/5, left lower extremity 3/5.  Hypertension: Stable, continue with losartan  History of seizure disorder: Continue with Vimpat.  Dyslipidemia: Continue with statin.  History of COPD/asthma: Lungs clear. Appears stable. Continue with as needed next.  Dementia: Suspect multi-infarct dementia. Current mental status at baseline.  History of aortic valve replacement-bioprosthetic valve.  TODAY-DAY OF DISCHARGE:  Subjective:   Aaron Johnson today  has no headache,no chest abdominal pain,no new weakness tingling or numbness.  Objective:   Blood pressure 147/97, pulse 66, temperature 98.4 F (36.9 C), temperature source Axillary, resp. rate 18, height 5\' 6"  (1.676 m), weight 63.231 kg (139 lb 6.4 oz), SpO2 100 %.  Intake/Output Summary (Last 24 hours) at 11/26/13 1016 Last data filed at 11/26/13 0635  Gross per 24 hour  Intake    600 ml  Output   1201 ml  Net   -601 ml   Filed Weights   11/24/13 2342  Weight: 63.231 kg (139 lb 6.4 oz)    Exam Awake Alert, Oriented *3, No new F.N deficits, Normal affect. Has chronic left dense hemiparesis. [place].AT,PERRAL Supple Neck,No JVD, No cervical lymphadenopathy appriciated.  Symmetrical Chest wall movement, Good air movement bilaterally, CTAB RRR,No Gallops,Rubs or new Murmurs, No Parasternal Heave +ve B.Sounds, Abd Soft, Non tender, No organomegaly appriciated, No rebound -guarding or rigidity. No Cyanosis, Clubbing or edema, No new Rash or bruise  DISCHARGE CONDITION: Stable  DISPOSITION: Home with home health services  DISCHARGE INSTRUCTIONS:    Activity:  As tolerated with Full fall precautions use walker/cane & assistance as needed  Diet recommendation: Heart Healthy diet Dysphagia 3 diet  Discharge Instructions    Call MD for:    Complete by:  As directed   Recurrent fall, worsening left-sided weakness.     Diet - low sodium heart healthy    Complete by:  As directed   Dysphagia 3 diet.     Increase activity slowly    Complete by:  As directed            Follow-up Information    Follow up with POLITE,RONALD D, MD. Schedule an appointment as soon as possible for a visit in 1 week.   Specialty:  Internal Medicine   Contact information:   [address] [place] [address] [phone]       Follow up with SETHI,PRAMOD, MD. Schedule an appointment as soon as possible for a visit in 1 week.   Specialties:  Neurology, Radiology   Contact  information:   [address] [place] [address] [phone]         Total Time spent on discharge equals 45 minutes.  SignedOren Binet 11/26/2013 10:16 AM

## 2013-11-27 NOTE — Telephone Encounter (Signed)
Patient has been scheduled for 12/07/13 with Dr Erlinda Hong at 10:30 am.

## 2013-12-07 ENCOUNTER — Ambulatory Visit (INDEPENDENT_AMBULATORY_CARE_PROVIDER_SITE_OTHER): Payer: Medicare Other | Admitting: Neurology

## 2013-12-07 ENCOUNTER — Encounter: Payer: Self-pay | Admitting: Neurology

## 2013-12-07 VITALS — BP 110/57 | HR 58 | Temp 98.9°F

## 2013-12-07 DIAGNOSIS — R569 Unspecified convulsions: Secondary | ICD-10-CM | POA: Insufficient documentation

## 2013-12-07 DIAGNOSIS — I10 Essential (primary) hypertension: Secondary | ICD-10-CM

## 2013-12-07 DIAGNOSIS — G8190 Hemiplegia, unspecified affecting unspecified side: Secondary | ICD-10-CM

## 2013-12-07 DIAGNOSIS — I480 Paroxysmal atrial fibrillation: Secondary | ICD-10-CM | POA: Insufficient documentation

## 2013-12-07 DIAGNOSIS — E7800 Pure hypercholesterolemia, unspecified: Secondary | ICD-10-CM

## 2013-12-07 DIAGNOSIS — G8194 Hemiplegia, unspecified affecting left nondominant side: Secondary | ICD-10-CM

## 2013-12-07 DIAGNOSIS — S06369A Traumatic hemorrhage of cerebrum, unspecified, with loss of consciousness of unspecified duration, initial encounter: Secondary | ICD-10-CM

## 2013-12-07 DIAGNOSIS — W19XXXD Unspecified fall, subsequent encounter: Secondary | ICD-10-CM

## 2013-12-07 NOTE — Progress Notes (Signed)
STROKE NEUROLOGY FOLLOW UP NOTE  NAME: Aaron Johnson DOB: [birth year]  REASON FOR VISIT: stroke follow up HISTORY FROM: chart and daughter  Today we had the pleasure of seeing Aaron Johnson in follow-up at our Neurology Clinic. Pt was accompanied by daughter.   History Summary Aaron Johnson is an 80 y.o. 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 She said that he is able to ambulate with a hemi walker, but last night 10/12/2012 she noticed that he was not able to use his right side as he usually does and couldn't even drink form a cup with the right hand. She stated that today he has been completely unable to use the right side. In addition, she thinks that his speech is significantly worse and he was drooling a lot earlier today. Aaron Johnson denied HA, vertigo, confusion, or falls. No recent fever or infection. On aspirin and plavix daily. Patient was not a TPA candidate secondary to delay in arrival. He was admitted to the neuro floor for further evaluation and treatment. MRI of the brain showed an acute nonhemorrhagic left thalamic infarct. MRA of the Head was significantly motion degraded. The patient is known to have an occluded right internal carotid artery. All that can be stated on the present examination is that there is flow within portions of the vertebral arteries bilaterally, basilar artery and the left internal carotid artery. Carotid Doppler showed 1-39% internal carotid artery stenosis bilaterally. 2D Echocardiogram with EF 60-65% with no source of embolus.  He has completed home PT and ST. He is essentially back to his baseline. He is taking aspirin and Plavix with no signs of bleeding or significant bruising. BP is well  controlled, is 125/62 in office today. He lives with his daughter and is able to ambulate with a hemi-walker for short distances.   Update 10/02/13 (LL): 08/02/13 he had acute onset mental status change, left arm stiffness which have resolved by the time he arrived to the ED but then returned. CThead reviewed and revealed a remote large right hemispheric infarct and left thalamic infarct, but not acute changes. He is on Plavix on ASA. He had work up for stroke in October 2014 with negative CT head but could not remain still for MRI/MRA. Carotid Doppler with 1-39% stenosis bilaterally, 2D echo with no PFO or thrombus. EEG showed artifact and was suboptimal for evaluation of spell.Due to 2 instances of altered mental status and left-sided weakness since last visit, ddx includes TIA vs. Seizure. He was started on Vimpat with no further spells since late July. He continues to be unsteady on his feet and has fallen at home in the last 3 months. His daughter states he continues to loose weight despite eating well.  Interval History During the interval time, the patient has been doing worse. He had fall on 10/04/13 and was evaluated in ER. Had bruise on the left side of the body. CT negative for acute changes and he was d/c in stable condition. Had repeat EEG on 11/06/13 did not show seizure activity but diffuse slowing. Followed up with cardiology Dr. Stanford Breed and continued on ASA and plavix as well as statin. Code status being addressed. Today pt and her daughter actually leaning to DNR. He also followed  up with ophthalmology and was told actually progressed to bilateral blindness now. Up to then, he still using semiwalker to walk at home. On 11/24/13, pt had another fall, went to ER, CT showed traumatic right small SAH around the old right MCA infarct. Repeat CT stable and he was discharged on 11/26/13. His ASA and plavix was stopped for a week and then ASA 81mg  was resumed but not plavix yet. Since then, most of  the times, he stayed in wheelchair as per daughter due to bilateral blindness and fall risk. Pt seems more sleepy drowsy and keep eyes closed most of time. He was still able to respond to questions, but very dysarthic and with short answers. Tracking objects according to sound.   REVIEW OF SYSTEMS: Full 14 system review of systems performed and notable only for those listed below and in HPI above, all others are negative:  Constitutional: N/A  Cardiovascular: N/A  Ear/Nose/Throat: N/A  Skin: N/A  Eyes: loss of vision, eye discharges  Respiratory: N/A  Gastroitestinal: N/A  Genitourinary: N/A Hematology/Lymphatic: N/A  Endocrine: N/A  Musculoskeletal: N/A  Allergy/Immunology: N/A  Neurological: N/A  Psychiatric: N/A  The following represents the patient's updated allergies and side effects list: No Known Allergies  The neurologically relevant items on the patient's problem list were reviewed on today's visit.  Neurologic Examination  A problem focused neurological exam (12 or more points of the single system neurologic examination, vital signs counts as 1 point, cranial nerves count for 8 points) was performed.  Blood pressure 110/57, pulse 58, temperature 98.9 F (37.2 C), temperature source Oral.  General - Well nourished, well developed, sleepy and drowsy, eyes closed most of the time but able to open on demand but not able to keep open.  Ophthalmologic - not cooperative on exam.  Cardiovascular - irregular heart rate with premature beats.  Neuro - pt sleepy and drowsy, easily arousable but back to drowsy state without constant stimulation. Follows some simple commands, but not all of them. Very dysarthria, paucity of speech, naming and repetition not able to test. Mild ptosis bilaterally, PERRL, sluggish to light, eye movement to bilateral horizontal direction, conjugated, not blinking to visual threat bilaterally. Left facial droop, tongue deviated to left. Left UE painful on  movement, 2/5 proximal and 0/5 distally. LLE 3/5 proximal and 2/5 distally, RUE 4/5 and RLE 4/5. Reflex 3+ on the left and 1+ on the right. Gait and coordination not able to test.  Data reviewed: I personally reviewed the images and agree with the radiology interpretations.  04/2010 MRI and MRA -  No acute or reversible findings. Generalized atrophy. Widespread chronic small vessel disease. Old infarction in the right middle cerebral artery territory. Right internal carotid artery occlusion, presumably chronic. Left internal carotid artery is patent. Vertebral basilar system is patent. More distal branch vessels show atherosclerotic change diffusely.  10/2012 MRI and MRA Acute nonhemorrhagic left thalamic infarct. Exam was attempted twice with sedation and remains significantly motion degraded. The patient is known to have an occluded right internal carotid artery. All that can be stated on the present examination is that there is flow within portions of the; vertebral arteries bilaterally, basilar artery and the left internal carotid artery.  10/04/13 CT head No evidence of acute intracranial abnormality. Encephalomalacic changes related to prior right frontoparietal infarct. Atrophy with small vessel ischemic changes and intracranial atherosclerosis.  11/26/13 CT head 1. Mild redistribution of blood products without significant interval change in the multiple foci of gyriform hemorrhage  in the right frontoparietal cortex. 2. Otherwise, stable sequelae of remote prior right frontoparietal infarct with associated encephalomalacia, asymmetric ventriculomegaly, generalized atrophy, chronic microvascular ischemic white matter disease, bilateral basal ganglia lacunar infarcts and intracranial atherosclerotic calcifications.  EEG 11/06/13 This is a severely abnormal EEG due to the presence of moderate bihemispheric slowing which is a nonspecific finding seen in a vareity of  degenerative, toxic, metabolic or hypoxic etiologies. No definitive epileptiform activity is seen.  EEG 07/2013 This is a technically difficult record due to the prominence of  muscle and movement artifact. Adequate evaluation of the  background rhythm could not be performed.  CUS 07/2013 Bilateral: 1-39% ICA stenosis. Vertebral artery flow is antegrade. Right: ICA/CCA ratio is 1.37. ECA is occluded distal to the origin.  2D echo 07/2013 - Left ventricle: The cavity size was normal. Wall thickness was increased in a pattern of mild LVH. Systolic function was moderately to severely reduced. The estimated ejection fraction was in the range of 30% to 35%. There was an increased relative contribution of atrial contraction to ventricular filling. Doppler parameters are consistent with abnormal left ventricular relaxation (grade 1 diastolic dysfunction). - Aortic valve: A bioprosthesis was present. There was mild stenosis. There was mild to moderate regurgitation. Valve area (VTI): 0.59 cm^2. Valve area (Vmax): 0.57 cm^2. - Right atrium: The atrium was mildly dilated. - Pulmonary arteries: PA peak pressure: 31 mm Hg (S).  Component     Latest Ref Rng 10/14/2012 08/02/2013  Cholesterol     0 - 200 mg/dL 140   Triglycerides     <150 mg/dL 43   HDL     >39 mg/dL 76   Total CHOL/HDL Ratio      1.8   VLDL     0 - 40 mg/dL 9   LDL (calc)     0 - 99 mg/dL 55   Hgb A1c MFr Bld     <5.7 % 5.2 5.1  Mean Plasma Glucose     <117 mg/dL 103 100    Assessment: As you may recall, he is a 80 y.o. 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 MRI showed right ICA occlusion. He was continued on ASA and plavix. In 07/2013 he had two episode of AMS, CT negative and EEG no seizure, but due to concern of seizure, he was  put on vimpat. Since then he was gradually declined with falling and gait disturbance. He had fall in Oct and again in Nov., when he was admitted for right [facility] at the region of his old right MCA stroke. Repeat CT head stable, he was discharged with discontinuation of dural antiplatelet for a week and resumed ASA 81mg  currently. Since discharge, pt was more sleepy and drowsy with eye closed but easily arousable. Most time spent in wheelchair. His recent ophthalmological exam showed bilateral blindness. Exam also showed not blinking to bilateral visual threat. He is currently on ASA 81mg , lipitor and on vimpat.   Plan:  - continue ASA and lipitor for now for stroke prevention - will repeat CT head in 2 weeks  - if blood product is gone, may resume plavix. - continue to follow up with ophthalmology and cardiology - Follow up with your primary care physician for stroke risk factor modification. Recommend maintain blood pressure goal <130/80, diabetes with hemoglobin A1c goal below 6.5% and lipids  with LDL cholesterol goal below 70 mg/dL.  - check BP at home - continue home PT/OT, agree with wheelchair to avoid fall - may consider NH placement soon - RTC with Dr. Leonie Man in 3 months.   Orders Placed This Encounter  Procedures  . CT Head Wo Contrast    Standing Status: Future     Number of Occurrences:      Standing Expiration Date: 03/08/2015    Order Specific Question:  Reason for Exam (SYMPTOM  OR DIAGNOSIS REQUIRED)    Answer:  traumatic ICH right MCA    Order Specific Question:  Preferred imaging location?    Answer:  Internal    No orders of the defined types were placed in this encounter.    Patient Instructions  - continue ASA and lipitor for stroke prevention for now - will do CT repeat in 2 weeks and if blood all absorbed will resume plavix - continue to follow up with cardiology and ophthalmology - Follow up with your primary care physician for stroke risk factor modification.  Recommend maintain blood pressure goal <130/80, diabetes with hemoglobin A1c goal below 6.5% and lipids with LDL cholesterol goal below 70 mg/dL.  - compliant with medication - check BP at home - continue home PT/OT, stay in wheelchair and avoid fall - follow up with Dr. Leonie Man in 3 months.     Rosalin Hawking, MD PhD [facility] [address], [place], [place] [address] [phone]

## 2013-12-07 NOTE — Patient Instructions (Signed)
-   continue ASA and lipitor for stroke prevention for now - will do CT repeat in 2 weeks and if blood all absorbed will resume plavix - continue to follow up with cardiology and ophthalmology - Follow up with your primary care physician for stroke risk factor modification. Recommend maintain blood pressure goal <130/80, diabetes with hemoglobin A1c goal below 6.5% and lipids with LDL cholesterol goal below 70 mg/dL.  - compliant with medication - check BP at home - continue home PT/OT, stay in wheelchair and avoid fall - follow up with Dr. Leonie Man in 3 months.

## 2013-12-16 ENCOUNTER — Emergency Department (HOSPITAL_COMMUNITY): Payer: PRIVATE HEALTH INSURANCE

## 2013-12-16 ENCOUNTER — Emergency Department (HOSPITAL_COMMUNITY)
Admission: EM | Admit: 2013-12-16 | Discharge: 2013-12-16 | Disposition: A | Discharge status: Home / Self Care | Payer: PRIVATE HEALTH INSURANCE | Attending: Emergency Medicine | Admitting: Emergency Medicine

## 2013-12-16 ENCOUNTER — Encounter (HOSPITAL_COMMUNITY): Payer: Self-pay

## 2013-12-16 DIAGNOSIS — Z954 Presence of other heart-valve replacement: Secondary | ICD-10-CM | POA: Insufficient documentation

## 2013-12-16 DIAGNOSIS — Z862 Personal history of diseases of the blood and blood-forming organs and certain disorders involving the immune mechanism: Secondary | ICD-10-CM | POA: Diagnosis not present

## 2013-12-16 DIAGNOSIS — Z8673 Personal history of transient ischemic attack (TIA), and cerebral infarction without residual deficits: Secondary | ICD-10-CM | POA: Diagnosis not present

## 2013-12-16 DIAGNOSIS — Z8739 Personal history of other diseases of the musculoskeletal system and connective tissue: Secondary | ICD-10-CM | POA: Insufficient documentation

## 2013-12-16 DIAGNOSIS — I5032 Chronic diastolic (congestive) heart failure: Secondary | ICD-10-CM | POA: Diagnosis not present

## 2013-12-16 DIAGNOSIS — Z87891 Personal history of nicotine dependence: Secondary | ICD-10-CM | POA: Diagnosis not present

## 2013-12-16 DIAGNOSIS — Z7982 Long term (current) use of aspirin: Secondary | ICD-10-CM | POA: Insufficient documentation

## 2013-12-16 DIAGNOSIS — E780 Pure hypercholesterolemia: Secondary | ICD-10-CM | POA: Insufficient documentation

## 2013-12-16 DIAGNOSIS — R079 Chest pain, unspecified: Secondary | ICD-10-CM | POA: Diagnosis not present

## 2013-12-16 DIAGNOSIS — Z9889 Other specified postprocedural states: Secondary | ICD-10-CM | POA: Diagnosis not present

## 2013-12-16 DIAGNOSIS — R4781 Slurred speech: Secondary | ICD-10-CM | POA: Diagnosis not present

## 2013-12-16 DIAGNOSIS — R011 Cardiac murmur, unspecified: Secondary | ICD-10-CM | POA: Insufficient documentation

## 2013-12-16 DIAGNOSIS — J449 Chronic obstructive pulmonary disease, unspecified: Secondary | ICD-10-CM | POA: Insufficient documentation

## 2013-12-16 DIAGNOSIS — I10 Essential (primary) hypertension: Secondary | ICD-10-CM | POA: Diagnosis not present

## 2013-12-16 DIAGNOSIS — Z79899 Other long term (current) drug therapy: Secondary | ICD-10-CM | POA: Insufficient documentation

## 2013-12-16 DIAGNOSIS — Z7951 Long term (current) use of inhaled steroids: Secondary | ICD-10-CM | POA: Insufficient documentation

## 2013-12-16 DIAGNOSIS — Z7902 Long term (current) use of antithrombotics/antiplatelets: Secondary | ICD-10-CM | POA: Diagnosis not present

## 2013-12-16 DIAGNOSIS — R0789 Other chest pain: Secondary | ICD-10-CM

## 2013-12-16 DIAGNOSIS — R4701 Aphasia: Secondary | ICD-10-CM | POA: Diagnosis not present

## 2013-12-16 LAB — I-STAT TROPONIN, ED
Troponin i, poc: 0.01 ng/mL (ref 0.00–0.08)
Troponin i, poc: 0.02 ng/mL (ref 0.00–0.08)

## 2013-12-16 LAB — I-STAT CHEM 8, ED
BUN: 16 mg/dL (ref 6–23)
CREATININE: 0.8 mg/dL (ref 0.50–1.35)
Calcium, Ion: 1.15 mmol/L (ref 1.13–1.30)
Chloride: 105 mEq/L (ref 96–112)
Glucose, Bld: 143 mg/dL — ABNORMAL HIGH (ref 70–99)
HCT: 36 % — ABNORMAL LOW (ref 39.0–52.0)
Hemoglobin: 12.2 g/dL — ABNORMAL LOW (ref 13.0–17.0)
Potassium: 3.3 mEq/L — ABNORMAL LOW (ref 3.7–5.3)
Sodium: 142 mEq/L (ref 137–147)
TCO2: 24 mmol/L (ref 0–100)

## 2013-12-16 LAB — COMPREHENSIVE METABOLIC PANEL
ALK PHOS: 102 U/L (ref 39–117)
ALT: 19 U/L (ref 0–53)
ANION GAP: 13 (ref 5–15)
AST: 24 U/L (ref 0–37)
Albumin: 3.6 g/dL (ref 3.5–5.2)
BUN: 15 mg/dL (ref 6–23)
CO2: 24 meq/L (ref 19–32)
Calcium: 9.3 mg/dL (ref 8.4–10.5)
Chloride: 102 mEq/L (ref 96–112)
Creatinine, Ser: 0.74 mg/dL (ref 0.50–1.35)
GFR, EST NON AFRICAN AMERICAN: 82 mL/min — AB (ref >90)
Glucose, Bld: 141 mg/dL — ABNORMAL HIGH (ref 70–99)
POTASSIUM: 3.5 meq/L — AB (ref 3.7–5.3)
Sodium: 139 mEq/L (ref 137–147)
TOTAL PROTEIN: 7.8 g/dL (ref 6.0–8.3)
Total Bilirubin: 0.7 mg/dL (ref 0.3–1.2)

## 2013-12-16 LAB — DIFFERENTIAL
Basophils Absolute: 0 10*3/uL (ref 0.0–0.1)
Basophils Relative: 0 % (ref 0–1)
EOS PCT: 1 % (ref 0–5)
Eosinophils Absolute: 0.1 10*3/uL (ref 0.0–0.7)
LYMPHS ABS: 1.9 10*3/uL (ref 0.7–4.0)
Lymphocytes Relative: 24 % (ref 12–46)
MONOS PCT: 12 % (ref 3–12)
Monocytes Absolute: 1 10*3/uL (ref 0.1–1.0)
NEUTROS PCT: 62 % (ref 43–77)
Neutro Abs: 5 10*3/uL (ref 1.7–7.7)

## 2013-12-16 LAB — URINALYSIS, ROUTINE W REFLEX MICROSCOPIC
BILIRUBIN URINE: NEGATIVE
GLUCOSE, UA: NEGATIVE mg/dL
HGB URINE DIPSTICK: NEGATIVE
Ketones, ur: 15 mg/dL — AB
Leukocytes, UA: NEGATIVE
Nitrite: NEGATIVE
Protein, ur: NEGATIVE mg/dL
SPECIFIC GRAVITY, URINE: 1.021 (ref 1.005–1.030)
Urobilinogen, UA: 0.2 mg/dL (ref 0.0–1.0)
pH: 5.5 (ref 5.0–8.0)

## 2013-12-16 LAB — CBC
HCT: 33.6 % — ABNORMAL LOW (ref 39.0–52.0)
HEMOGLOBIN: 10.7 g/dL — AB (ref 13.0–17.0)
MCH: 27.6 pg (ref 26.0–34.0)
MCHC: 31.8 g/dL (ref 30.0–36.0)
MCV: 86.8 fL (ref 78.0–100.0)
Platelets: 115 10*3/uL — ABNORMAL LOW (ref 150–400)
RBC: 3.87 MIL/uL — AB (ref 4.22–5.81)
RDW: 13 % (ref 11.5–15.5)
WBC: 8 10*3/uL (ref 4.0–10.5)

## 2013-12-16 LAB — CBG MONITORING, ED: Glucose-Capillary: 103 mg/dL — ABNORMAL HIGH (ref 70–99)

## 2013-12-16 LAB — PROTIME-INR
INR: 1.18 (ref 0.00–1.49)
Prothrombin Time: 15.2 seconds (ref 11.6–15.2)

## 2013-12-16 LAB — APTT: aPTT: 31 seconds (ref 24–37)

## 2013-12-16 NOTE — Discharge Instructions (Signed)
We saw you in the ER for the slurred speech and chest pain. All the results in the ER are normal, labs and imaging. We are not sure what is causing your symptoms. The workup in the ER is not complete, and is limited to screening for life threatening and emergent conditions only, SO IT IS IMPORTANT THAT YOU SEE THE outpatient Cardiologist and Neurologist as planned.   Chest Pain (Nonspecific) It is often hard to give a diagnosis for the cause of chest pain. There is always a chance that your pain could be related to something serious, such as a heart attack or a blood clot in the lungs. You need to follow up with your doctor. HOME CARE  If antibiotic medicine was given, take it as directed by your doctor. Finish the medicine even if you start to feel better.  For the next few days, avoid activities that bring on chest pain. Continue physical activities as told by your doctor.  Do not use any tobacco products. This includes cigarettes, chewing tobacco, and e-cigarettes.  Avoid drinking alcohol.  Only take medicine as told by your doctor.  Follow your doctor's suggestions for more testing if your chest pain does not go away.  Keep all doctor visits you made. GET HELP IF:  Your chest pain does not go away, even after treatment.  You have a rash with blisters on your chest.  You have a fever. GET HELP RIGHT AWAY IF:   You have more pain or pain that spreads to your arm, neck, jaw, back, or belly (abdomen).  You have shortness of breath.  You cough more than usual or cough up blood.  You have very bad back or belly pain.  You feel sick to your stomach (nauseous) or throw up (vomit).  You have very bad weakness.  You pass out (faint).  You have chills. This is an emergency. Do not wait to see if the problems will go away. Call your local emergency services (911 in [place] Do not drive yourself to the hospital. MAKE SURE YOU:   Understand these instructions.  Will watch  your condition.  Will get help right away if you are not doing well or get worse. Document Released: 06/09/2007 Document Revised: 12/26/2012 Document Reviewed: 06/09/2007 [facility] Patient Information 2015 [place], [place]. This information is not intended to replace advice given to you by your health care provider. Make sure you discuss any questions you have with your health care provider.

## 2013-12-16 NOTE — ED Notes (Signed)
Per EMS, Patient's family noticed patient had a changed in speech last night around 1800. Patient was becoming hard to understand. Patient started to resolve later through the night. Patient woke up this morning and complained of left sided chest pain with no other symptoms. Family states patient has not slept in two days. Patient has a history of TIA and Stroke with left sided deficit. Patient is alert and oriented x4 upon arrival. Vitals per EMS: 132/70, 74 HR, 16 RR, 98 CBG, 93 % on RA and 97% on 2L.

## 2013-12-16 NOTE — ED Notes (Signed)
Phlebotomy at the bedside  

## 2013-12-16 NOTE — ED Provider Notes (Signed)
CSN: [id number]     Arrival date & time 12/16/13  1311 History   First MD Initiated Contact with Patient 12/16/13 1312     Chief Complaint  Patient presents with  . Aphasia  . Chest Pain     (Consider location/radiation/quality/duration/timing/severity/associated sxs/prior Treatment) HPI Comments: 80 y.o. Male with pmhx HTN, HLD, chronic diastolic CHF, s/p aortic valve replacement and Stroke with L sided deficit. Pt has been having slurred speech intermittently and also complained of chest pain. Pt lives with his daughter. Pt has no chest pain right now. He had chest pain at "one time" and it was left sided. Pt was not nauseated, or having shortness of breath. There is no cough, or recent infection. Per daughter, pt has been having off and on slurred speech. He is on asa and plavix  And seeing a neurologist as an outpatient after he had a traumatic SAH. PT is due to be seen again after a repeat CT next week, and was seen by Neurologist last week. No new falls, no recent illness.  Patient is a 80 y.o. male presenting with chest pain. The history is provided by the patient.  Chest Pain Associated symptoms: no abdominal pain, no cough, no headache and no shortness of breath     Past Medical History  Diagnosis Date  . Acquired musculoskeletal deformity of unspecified site   . Chronic diastolic heart failure     EF >= 50%  . HTN (hypertension), benign     systematic  . Pure hypercholesterolemia   . COPD (chronic obstructive pulmonary disease)   . Benign neoplasm of colon   . 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 Iron deficiency anemia, unspecified   . LFTs abnormal 3/06    LFTs wnl, Cr - 1.3 stable. 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 colonoscopy 3/05- adenomatous polyps, mild gastritis-diffuse interosseous skeletal hypertosis (DISH), h/o H. pylori s/p tx.  .  Stroke   . Hemiparesis, left   . Aortic valve insufficiency   . CHF (congestive heart failure)   . 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 Cardiac catheterization  1/99    mild CAD; EF 35-40%  . Aortic valve replacement      with pericardial tissue valve  . Cardiac valve replacement    . Multiple tooth extractions     Family History  Problem Relation Age of Onset  . Colon cancer Neg Hx   . Heart disease Father    History  Substance Use Topics  . Smoking status: Former Smoker    Types: Cigarettes  . Smokeless tobacco: Never Used     Comment: 10/13/2012 "smoked a little bit a long time ago"  . Alcohol Use: Yes     Comment: 10/13/2012 "haven't had anything to drink for years"    Review of Systems  Constitutional: Negative for activity change.  HENT: Negative for drooling.   Respiratory: Negative for cough and shortness of breath.   Cardiovascular: Positive for chest pain.  Gastrointestinal: Negative for abdominal pain.  Genitourinary: Negative for dysuria.  Allergic/Immunologic: Negative for immunocompromised state.  Neurological: Negative for seizures and headaches.  Psychiatric/Behavioral: Positive for confusion.  All other systems reviewed and are negative.     Allergies  Review of patient's allergies indicates no  known allergies.  Home Medications   Prior to Admission medications   Medication Sig Start Date End Date Taking? Authorizing Provider  albuterol-ipratropium (COMBIVENT) 18-103 MCG/ACT inhaler Inhale 1 puff into the lungs every 4 (four) hours as needed for wheezing or shortness of breath.    Yes Historical Provider, MD  aspirin 81 MG tablet Take 1 tablet (81 mg total) by mouth daily. Resume after 7 days from 11/26/13 11/26/13  Yes Shanker Kristeen Mans, MD  atorvastatin (LIPITOR) 40 MG tablet Take 40 mg by mouth at bedtime.    Yes Historical Provider, MD  brimonidine (ALPHAGAN P)  0.1 % SOLN Place 1 drop into both eyes 2 (two) times daily.   Yes Historical Provider, MD  docusate sodium (COLACE) 100 MG capsule Take 100 mg by mouth daily.   Yes Historical Provider, MD  dorzolamide (TRUSOPT) 2 % ophthalmic solution Place 1 drop into both eyes 3 (three) times daily.   Yes Historical Provider, MD  Fluticasone-Salmeterol (ADVAIR DISKUS) 250-50 MCG/DOSE AEPB Inhale 2 puffs into the lungs daily.    Yes Historical Provider, MD  furosemide (LASIX) 20 MG tablet Take 20 mg by mouth daily as needed for fluid or edema. For edema   Yes Historical Provider, MD  lacosamide (VIMPAT) 50 MG TABS tablet Take 1 tablet (50 mg total) by mouth 2 (two) times daily. 08/04/13  Yes Maryann Mikhail, DO  losartan (COZAAR) 25 MG tablet Take 25 mg by mouth daily.   Yes Historical Provider, MD  Multiple Vitamin (MULTIVITAMIN) tablet Take 1 tablet by mouth daily.     Yes Historical Provider, MD  oxyCODONE-acetaminophen (PERCOCET) 5-325 MG per tablet Take 0.5-1 tablets by mouth every 4 (four) hours as needed for moderate pain.    Yes Historical Provider, MD  polyethylene glycol (MIRALAX / GLYCOLAX) packet Take 17 g by mouth daily.   Yes Historical Provider, MD  potassium chloride (KLOR-CON) 20 MEQ packet Take 20 mEq by mouth daily as needed (for edema when taking lasix).    Yes Historical Provider, MD  Solifenacin Succinate (VESICARE PO) Take 5 mg by mouth every morning.    Yes Historical Provider, MD  temazepam (RESTORIL) 15 MG capsule Take 15 mg by mouth at bedtime.   Yes Historical Provider, MD  Travoprost, BAK Free, (TRAVATAN) 0.004 % SOLN ophthalmic solution Place 1 drop into both eyes at bedtime.   Yes Historical Provider, MD   BP 101/43 mmHg  Pulse 80  Temp(Src) 97.7 F (36.5 C) (Oral)  Resp 14  SpO2 99% Physical Exam  Constitutional: He is oriented to person, place, and time. He appears well-developed.  HENT:  Head: Atraumatic.  Neck: Normal range of motion. Neck supple.  Cardiovascular: Normal  rate and regular rhythm.   Murmur heard. Pulmonary/Chest: Effort normal and breath sounds normal.  Abdominal: Soft. Bowel sounds are normal. He exhibits no distension. There is no tenderness. There is no rebound and no guarding.  Neurological: He is alert and oriented to person, place, and time.  Speech appears slightly slurred. Pt has L sided paralysis with upper extremity contracture.  Skin: Skin is warm.  Vitals reviewed.   ED Course  Procedures (including critical care time) Labs Review Labs Reviewed  CBC - Abnormal; Notable for the following:    RBC 3.87 (*)    Hemoglobin 10.7 (*)    HCT 33.6 (*)    Platelets 115 (*)    All other components within normal limits  COMPREHENSIVE METABOLIC PANEL - Abnormal; Notable for the following:  Potassium 3.5 (*)    Glucose, Bld 141 (*)    GFR calc non Af Amer 82 (*)    All other components within normal limits  CBG MONITORING, ED - Abnormal; Notable for the following:    Glucose-Capillary 103 (*)    All other components within normal limits  I-STAT CHEM 8, ED - Abnormal; Notable for the following:    Potassium 3.3 (*)    Glucose, Bld 143 (*)    Hemoglobin 12.2 (*)    HCT 36.0 (*)    All other components within normal limits  URINE CULTURE  PROTIME-INR  APTT  DIFFERENTIAL  URINALYSIS, ROUTINE W REFLEX MICROSCOPIC  I-STAT TROPOININ, ED  I-STAT TROPOININ, ED    Imaging Review Ct Head (brain) Wo Contrast  12/16/2013   CLINICAL DATA:  Altered mental status with slurred speech  EXAM: CT HEAD WITHOUT CONTRAST  TECHNIQUE: Contiguous axial images were obtained from the base of the skull through the vertex without intravenous contrast.  COMPARISON:  11/26/2013  FINDINGS: Bony calvarium is intact. Diffuse atrophic changes are right identified. There again noted changes consistent with right frontal and parietal encephalomalacia secondary to prior infarct. Chronic white matter ischemic changes noted. No findings to suggest acute  hemorrhage, acute infarction or space-occupying mass lesion are noted. The lacunar infarct is noted in the left thalamus.  IMPRESSION: Chronic atrophic and ischemic changes.  Stable encephalomalacia in the right frontal and parietal lobes  No acute abnormality noted.   Electronically Signed   By: Inez Catalina M.D.   On: 12/16/2013 14:51     EKG Interpretation   Date/Time:  Sunday December 16 2013 13:19:48 EST Ventricular Rate:  79 PR Interval:  47 QRS Duration: 111 QT Interval:  516 QTC Calculation: 592 R Axis:   -17 Text Interpretation:  Sinus rhythm Short PR interval Incomplete left  bundle branch block Probable anterolateral infarct, recent Prolonged QT  interval Artifact Confirmed by Kathrynn Humble, MD, Thelma Comp [phone]) on 12/16/2013  2:56:57 PM      MDM   Final diagnoses:  Chest pain of uncertain etiology  Slurred speech    PT comes in with cc of chest pain and slurred speech - intermittent.  Chest pain x 1 episode. Non specific. Pt is a poor historian. Pt has CAD - being medically managed it appears. He has a very non specific, 1 episode of chest pain. EKG has no acute changes. Serial trops to be ordered, if neg, pt can be discharged with outpatient cards appt.  Pt also has intermittent slurred speech, now for several days. Reviewed case with inpatient neurology, and also reviewed noted from outpatient neurology. Pt already on appropriate meds (no plavix for now due to acute [facility]). CT head done here, and doesn't show any new changes. Feel it is likely best for pt to get continued outpatient neuro assessment and eval. Pt is to see neuro again soon for a f/u. It appears that they are also aware of the dysarthria.   Extensive discussed all my thought process and workup results with the daughter, who agrees with the plan.  Varney Biles, MD 12/16/13 1640

## 2013-12-16 NOTE — ED Notes (Signed)
MD Nanavati at the bedside  

## 2013-12-16 NOTE — ED Notes (Signed)
Patient returned from CT

## 2013-12-19 LAB — URINE CULTURE
Colony Count: NO GROWTH
Culture: NO GROWTH

## 2013-12-25 ENCOUNTER — Encounter: Payer: Self-pay | Admitting: Cardiology

## 2013-12-25 ENCOUNTER — Ambulatory Visit (INDEPENDENT_AMBULATORY_CARE_PROVIDER_SITE_OTHER): Payer: PRIVATE HEALTH INSURANCE | Admitting: Cardiology

## 2013-12-25 VITALS — BP 112/52 | HR 74

## 2013-12-25 DIAGNOSIS — F0391 Unspecified dementia with behavioral disturbance: Secondary | ICD-10-CM

## 2013-12-25 DIAGNOSIS — I420 Dilated cardiomyopathy: Secondary | ICD-10-CM

## 2013-12-25 DIAGNOSIS — H547 Unspecified visual loss: Secondary | ICD-10-CM

## 2013-12-25 DIAGNOSIS — I351 Nonrheumatic aortic (valve) insufficiency: Secondary | ICD-10-CM

## 2013-12-25 DIAGNOSIS — Z952 Presence of prosthetic heart valve: Secondary | ICD-10-CM

## 2013-12-25 DIAGNOSIS — R5381 Other malaise: Secondary | ICD-10-CM | POA: Insufficient documentation

## 2013-12-25 DIAGNOSIS — Z954 Presence of other heart-valve replacement: Secondary | ICD-10-CM

## 2013-12-25 DIAGNOSIS — H540 Blindness, both eyes: Secondary | ICD-10-CM

## 2013-12-25 MED ORDER — HALOPERIDOL 0.5 MG PO TABS: 0.5000 mg | ORAL_TABLET | Freq: Two times a day (BID) | ORAL | Status: DC

## 2013-12-25 NOTE — Assessment & Plan Note (Signed)
EF 30-35% by echo July 2015

## 2013-12-25 NOTE — Progress Notes (Signed)
12/25/2013 Julianne Rice   1926-05-08  [id number]  Primary Physician Kandice Hams, MD Primary Cardiologist: Dr Stanford Breed  HPI:  80 y/o AA male with a history of tissue AVR in 1999. He had no CAD then. He had a significant Lt brain stroke in Oct 2014 and an [place] after a fall in Nov 2015. Plavix had been added in Oct 2014 and this discontinued during his last admission. He is on ASA 81 mg now. Since discharge he was seen once i the ER for an episode of chest pain. History was anecdotal as the pt could not remember having chest pain. His EKG was unreadable secondary to artifact and his Troponin negative. He was refered here for further evaluation. He is in the office today with a family member. She tells me he has had no further chest pain or unusual dyspnea. He is wheel chair bound and now is DNR per hospital records.    Current Outpatient Prescriptions  Medication Sig Dispense Refill  . albuterol-ipratropium (COMBIVENT) 18-103 MCG/ACT inhaler Inhale 1 puff into the lungs every 4 (four) hours as needed for wheezing or shortness of breath.     Marland Kitchen aspirin 81 MG tablet Take 1 tablet (81 mg total) by mouth daily. Resume after 7 days from 11/26/13 30 tablet   . atorvastatin (LIPITOR) 40 MG tablet Take 40 mg by mouth at bedtime.     . brimonidine (ALPHAGAN P) 0.1 % SOLN Place 1 drop into both eyes 2 (two) times daily.    Marland Kitchen docusate sodium (COLACE) 100 MG capsule Take 100 mg by mouth daily.    . dorzolamide (TRUSOPT) 2 % ophthalmic solution Place 1 drop into both eyes 3 (three) times daily.    . Fluticasone-Salmeterol (ADVAIR DISKUS) 250-50 MCG/DOSE AEPB Inhale 2 puffs into the lungs daily.     . furosemide (LASIX) 20 MG tablet Take 20 mg by mouth daily as needed for fluid or edema. For edema    . lacosamide (VIMPAT) 50 MG TABS tablet Take 1 tablet (50 mg total) by mouth 2 (two) times daily. 60 tablet 0  . losartan (COZAAR) 25 MG tablet Take 25 mg by mouth daily.    . Multiple Vitamin  (MULTIVITAMIN) tablet Take 1 tablet by mouth daily.      Marland Kitchen oxyCODONE-acetaminophen (PERCOCET) 5-325 MG per tablet Take 0.5-1 tablets by mouth every 4 (four) hours as needed for moderate pain.     . polyethylene glycol (MIRALAX / GLYCOLAX) packet Take 17 g by mouth daily.    . potassium chloride (KLOR-CON) 20 MEQ packet Take 20 mEq by mouth daily as needed (for edema when taking lasix).     . Solifenacin Succinate (VESICARE PO) Take 5 mg by mouth every morning.     . temazepam (RESTORIL) 15 MG capsule Take 15 mg by mouth at bedtime.    . Travoprost, BAK Free, (TRAVATAN) 0.004 % SOLN ophthalmic solution Place 1 drop into both eyes at bedtime.    . haloperidol (HALDOL) 0.5 MG tablet Take 1 tablet (0.5 mg total) by mouth 2 (two) times daily. 30 tablet 6   No current facility-administered medications for this visit.    No Known Allergies  History   Social History  . Marital Status: Widowed    Spouse Name: N/A    Number of Children: 4  . Years of Education: 7   Occupational History  . disabled    Social History Main Topics  . Smoking status: Former Smoker  Types: Cigarettes  . Smokeless tobacco: Never Used     Comment: 10/13/2012 "smoked a little bit a long time ago"  . Alcohol Use: Yes     Comment: 10/13/2012 "haven't had anything to drink for years"  . Drug Use: No  . Sexual Activity: No   Other Topics Concern  . Not on file   Social History Narrative   Married to [place]; lives with daughter. Children involved in his life.      Review of Systems: General: negative for chills, fever, night sweats or weight changes.  Cardiovascular: negative for chest pain, dyspnea on exertion, edema, orthopnea, palpitations, paroxysmal nocturnal dyspnea or shortness of breath Dermatological: negative for rash Respiratory: negative for cough or wheezing Urologic: negative for hematuria Abdominal: negative for nausea, vomiting, diarrhea, bright red blood per rectum, melena, or  hematemesis Neurologic: negative for visual changes, syncope, or dizziness All other systems reviewed and are otherwise negative except as noted above.    Blood pressure 112/52, pulse 74.  General appearance: alert, cooperative and no distress Lungs: clear to auscultation bilaterally Heart: regular rate and rhythm and soft systolic murmur   ASSESSMENT AND PLAN:   Intracranial hemorrhage Admitted 11/24/13- conservative Rx  S/P AVR S/P tissue AVR 1999  Moderate aortic regurgitation Last Echo July 2015  Cardiomyopathy, dilated EF 30-35% by echo July 2015  Debilitated patient In [facility]  Dementia Sundowning   PLAN  I did not order any further testing. He would not be a candidate for anything except medical treatment. I did prescribe Haldol 0.5 mg Q HS. The daughter says they were given Restoril and that made him worse. He can follow up in 6 months.   Dartanion Teo KPA-C 12/25/2013 2:04 PM

## 2013-12-25 NOTE — Assessment & Plan Note (Signed)
S/P tissue AVR 1999

## 2013-12-25 NOTE — Assessment & Plan Note (Signed)
In [facility]

## 2013-12-25 NOTE — Assessment & Plan Note (Signed)
Last Echo July 2015

## 2013-12-25 NOTE — Assessment & Plan Note (Signed)
Admitted 11/24/13- conservative Rx

## 2013-12-25 NOTE — Patient Instructions (Signed)
Your physician wants you to follow-up in: [place] will receive a reminder letter in the mail two months in advance. If you don't receive a letter, please call our office to schedule the follow-up appointment.   START HALDOL 0.5 MG DAILY AT BEDTIME

## 2013-12-25 NOTE — Assessment & Plan Note (Signed)
Sundowning

## 2013-12-31 ENCOUNTER — Telehealth: Payer: Self-pay | Admitting: Neurology

## 2013-12-31 NOTE — Telephone Encounter (Signed)
Pt's daughter is calling stating his mental health is deteriorating really bad. The medication is not helping.  He is doing things like spitting and kicking the wall.  Not sure what to do. Please call and advise.

## 2014-01-03 NOTE — Telephone Encounter (Signed)
Called the number to his daughter Levada Dy, but no one available for the call. Left message but can not complete as the memory was full. Will call again later time.  Rosalin Hawking, MD PhD Stroke Neurology 01/03/2014 4:35 PM

## 2014-01-04 NOTE — Telephone Encounter (Signed)
Discussed with daughter over the phone. She stated that since pt became blind bilateral eyes, he had visual hallucinations. He sees some figures that not there, he calling some people who has died, he tried to grab something in the air, kicking the wall beside the bed. Also he was more restless and not sleeping at the night. Daughter send him to ER mid Dec., was told sun downing. He was put on haloperidol 0.5mg  bid. Daughter did not see big improvement, she called back and they asked her to increase dose to 1mg  bid. She is going to try that.   I told her that bilateral blindness can cause visual hallucinations due to visual deprivation. We can also try risperdol or seroquel. Daughter wants to try increased haldol first and will call us if that does not help. She did not put pt in NH unless she can not handle any more. Seizure is in differentials but daughter denies any shaking jerking. His CT repeat showed resolution of previous SAH and chronic right frontal encephalomalacia. He is on vmipat 50mg  bid now. Will hold off EEG for now. If pt condition not getting better, will consider EEG.  Rosalin Hawking, MD PhD Stroke Neurology 01/04/2014 4:22 PM

## 2014-02-22 ENCOUNTER — Ambulatory Visit
Admission: RE | Admit: 2014-02-22 | Discharge: 2014-02-22 | Disposition: A | Discharge status: Home / Self Care | Payer: Medicare Other | Source: Ambulatory Visit | Attending: Internal Medicine | Admitting: Internal Medicine

## 2014-02-22 ENCOUNTER — Other Ambulatory Visit: Payer: Self-pay | Admitting: Internal Medicine

## 2014-02-22 DIAGNOSIS — R059 Cough, unspecified: Secondary | ICD-10-CM

## 2014-02-22 DIAGNOSIS — R05 Cough: Secondary | ICD-10-CM

## 2014-04-16 ENCOUNTER — Ambulatory Visit (INDEPENDENT_AMBULATORY_CARE_PROVIDER_SITE_OTHER): Payer: Medicare Other | Admitting: Neurology

## 2014-04-16 ENCOUNTER — Encounter: Payer: Self-pay | Admitting: Neurology

## 2014-04-16 VITALS — BP 127/67 | HR 66 | Wt 130.0 lb

## 2014-04-16 DIAGNOSIS — G8110 Spastic hemiplegia affecting unspecified side: Secondary | ICD-10-CM | POA: Diagnosis not present

## 2014-04-16 NOTE — Progress Notes (Signed)
STROKE NEUROLOGY FOLLOW UP NOTE  NAME: Aaron Johnson DOB: [birth year]  REASON FOR VISIT: stroke follow up HISTORY FROM: chart and daughter  Today we had the pleasure of seeing Aaron Johnson in follow-up at our Neurology Clinic. Pt was accompanied by daughter.   History Summary Aaron Johnson is an 81 y.o. 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 She said that Aaron Johnson is able to ambulate with a hemi walker, but last night 10/12/2012 she noticed that Aaron Johnson was not able to use his right side as Aaron Johnson usually does and couldn't even drink form a cup with the right hand. She stated that today Aaron Johnson has been completely unable to use the right side. In addition, she thinks that his speech is significantly worse and Aaron Johnson was drooling a lot earlier today. Aaron Johnson denied HA, vertigo, confusion, or falls. No recent fever or infection. On aspirin and plavix daily. Patient was not a TPA candidate secondary to delay in arrival. Aaron Johnson was admitted to the neuro floor for further evaluation and treatment. MRI of the brain showed an acute nonhemorrhagic left thalamic infarct. MRA of the Head was significantly motion degraded. The patient is known to have an occluded right internal carotid artery. All that can be stated on the present examination is that there is flow within portions of the vertebral arteries bilaterally, basilar artery and the left internal carotid artery. Carotid Doppler showed 1-39% internal carotid artery stenosis bilaterally. 2D Echocardiogram with EF 60-65% with no source of embolus.  Aaron Johnson has completed home PT and ST. Aaron Johnson is essentially back to his baseline. Aaron Johnson is taking aspirin and Plavix with no signs of bleeding or significant bruising. BP is well  controlled, is 125/62 in office today. Aaron Johnson lives with his daughter and is able to ambulate with a hemi-walker for short distances.   Update 10/02/13 (LL): 08/02/13 Aaron Johnson had acute onset mental status change, left arm stiffness which have resolved by the time Aaron Johnson arrived to the ED but then returned. CThead reviewed and revealed a remote large right hemispheric infarct and left thalamic infarct, but not acute changes. Aaron Johnson is on Plavix on ASA. Aaron Johnson had work up for stroke in October 2014 with negative CT head but could not remain still for MRI/MRA. Carotid Doppler with 1-39% stenosis bilaterally, 2D echo with no PFO or thrombus. EEG showed artifact and was suboptimal for evaluation of spell.Due to 2 instances of altered mental status and left-sided weakness since last visit, ddx includes TIA vs. Seizure. Aaron Johnson was started on Vimpat with no further spells since late July. Aaron Johnson continues to be unsteady on his feet and has fallen at home in the last 3 months. His daughter states Aaron Johnson continues to loose weight despite eating well.  Interval History During the interval time, the patient has been doing worse. Aaron Johnson had fall on 10/04/13 and was evaluated in ER. Had bruise on the left side of the body. CT negative for acute changes and Aaron Johnson was d/c in stable condition. Had repeat EEG on 11/06/13 did not show seizure activity but diffuse slowing. Followed up with cardiology Dr. Stanford Breed and continued on ASA and plavix as well as statin. Code status being addressed. Today pt and her daughter actually leaning to DNR. Aaron Johnson also followed  up with ophthalmology and was told actually progressed to bilateral blindness now. Up to then, Aaron Johnson still using semiwalker to walk at home. On 11/24/13, pt had another fall, went to ER, CT showed traumatic right small SAH around the old right MCA infarct. Repeat CT stable and Aaron Johnson was discharged on 11/26/13. His ASA and plavix was stopped for a week and then ASA 81mg  was resumed but not plavix yet. Since then, most of  the times, Aaron Johnson stayed in wheelchair as per daughter due to bilateral blindness and fall risk. Pt seems more sleepy drowsy and keep eyes closed most of time. Aaron Johnson was still able to respond to questions, but very dysarthic and with short answers. Tracking objects according to sound.  Update 04/16/2014 : Aaron Johnson returns for follow-up after last visit with Dr. Erlinda Hong 4 months ago. Aaron Johnson is accompanied by his daughter who provides most of the history. Patient has been sundowning a lot and has been sleepy off and on Aaron Johnson has good days and bad days. Aaron Johnson recently unfortunately lost his grandson to drive by shooting and butted him a few days ago. Patient needs full help with all activities of daily living. Aaron Johnson is able to walk with a hemiwalker but uses a wheelchair for long distances. Aaron Johnson needs help with transfer, changing his clothes as well as using the restroom. Aaron Johnson is able to carry out the full conversations but is disoriented at baseline. Aaron Johnson is tolerating aspirin and Lipitor well without any side effects. Aaron Johnson has been on Vimpat which was started in the hospital for presumed seizures but there is no definite documented seizure activity in EEG in the past have been normal. Aaron Johnson has been sleepy off and on without explanation and except Vimpat I do not want any other medications which possibly could be responsible Aaron Johnson has been awake several nights and sleepy during the day. Dr. Delfina Redwood has tried seroquel and trazodone with modest success. REVIEW OF SYSTEMS: Full 14 system review of systems performed and notable only for those listed below and in HPI above, all others are negative:   Activity change, hearing loss, trouble swallowing, drooling, eye discharge, light sensitivity, blindness, back pain, agitation, confusion, anxiety and nervousness and sundowning. All other systems negative. The following represents the patient's updated allergies and side effects list: No Known Allergies  The neurologically relevant items on the patient's problem  list were reviewed on today's visit.  Neurologic Examination  A problem focused neurological exam (12 or more points of the single system neurologic examination, vital signs counts as 1 point, cranial nerves count for 8 points) was performed.  Blood pressure 127/67, pulse 66, weight 129 lb 15.7 oz (58.959 kg).  General - Well nourished, well developed, sleepy and drowsy, eyes closed most of the time but able to open on demand but not able to keep open.  Ophthalmologic - not cooperative on exam.  Cardiovascular - irregular heart rate with premature beats.  Neuro - awake,  interactive. Follows some simple commands, but not all of them. Mild dysarthria, paucity of speech, naming and repetition not able to test. Mild ptosis bilaterally, PERRL, sluggish to light, eye movement to bilateral horizontal direction, conjugated, not blinking to visual threat bilaterally. Left facial droop, tongue deviated to left. Spastic left hemiparesis with Left UE painful on movement, 2/5 proximal and 0/5 distally. LLE 3/5 proximal and 2/5 distally, RUE 4/5 and RLE 4/5. Reflex 3+ on the left and 1+ on the right. Gait and coordination not able to test.  Data  reviewed: I personally reviewed the images and agree with the radiology interpretations.  04/2010 MRI and MRA -  No acute or reversible findings. Generalized atrophy. Widespread chronic small vessel disease. Old infarction in the right middle cerebral artery territory. Right internal carotid artery occlusion, presumably chronic. Left internal carotid artery is patent. Vertebral basilar system is patent. More distal branch vessels show atherosclerotic change diffusely.  10/2012 MRI and MRA Acute nonhemorrhagic left thalamic infarct. Exam was attempted twice with sedation and remains significantly motion degraded. The patient is known to have an occluded right internal carotid artery. All that can be stated on the present examination is that there is flow  within portions of the; vertebral arteries bilaterally, basilar artery and the left internal carotid artery.  10/04/13 CT head No evidence of acute intracranial abnormality. Encephalomalacic changes related to prior right frontoparietal infarct. Atrophy with small vessel ischemic changes and intracranial atherosclerosis.  11/26/13 CT head 1. Mild redistribution of blood products without significant interval change in the multiple foci of gyriform hemorrhage in the right frontoparietal cortex. 2. Otherwise, stable sequelae of remote prior right frontoparietal infarct with associated encephalomalacia, asymmetric ventriculomegaly, generalized atrophy, chronic microvascular ischemic white matter disease, bilateral basal ganglia lacunar infarcts and intracranial atherosclerotic calcifications.  EEG 11/06/13 This is a severely abnormal EEG due to the presence of moderate bihemispheric slowing which is a nonspecific finding seen in a vareity of degenerative, toxic, metabolic or hypoxic etiologies. No definitive epileptiform activity is seen.  EEG 07/2013 This is a technically difficult record due to the prominence of  muscle and movement artifact. Adequate evaluation of the  background rhythm could not be performed.  CUS 07/2013 Bilateral: 1-39% ICA stenosis. Vertebral artery flow is antegrade. Right: ICA/CCA ratio is 1.37. ECA is occluded distal to the origin.  2D echo 07/2013 - Left ventricle: The cavity size was normal. Wall thickness was increased in a pattern of mild LVH. Systolic function was moderately to severely reduced. The estimated ejection fraction was in the range of 30% to 35%. There was an increased relative contribution of atrial contraction to ventricular filling. Doppler parameters are consistent with abnormal left ventricular relaxation (grade 1 diastolic dysfunction). - Aortic valve: A bioprosthesis was present. There was mild stenosis. There was  mild to moderate regurgitation. Valve area (VTI): 0.59 cm^2. Valve area (Vmax): 0.57 cm^2. - Right atrium: The atrium was mildly dilated. - Pulmonary arteries: PA peak pressure: 31 mm Hg (S).  Component     Latest Ref Rng 10/14/2012 08/02/2013  Cholesterol     0 - 200 mg/dL 140   Triglycerides     <150 mg/dL 43   HDL     >39 mg/dL 76   Total CHOL/HDL Ratio      1.8   VLDL     0 - 40 mg/dL 9   LDL (calc)     0 - 99 mg/dL 55   Hgb A1c MFr Bld     <5.7 % 5.2 5.1  Mean Plasma Glucose     <117 mg/dL 103 100    Assessment:    Aaron Johnson is a 81 y.o. 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 MRI showed right ICA occlusion. Aaron Johnson was continued on ASA and plavix. In 07/2013 Aaron Johnson had two episode of AMS, CT negative and EEG no seizure, but due to concern of seizure,  Aaron Johnson was put on vimpat. Since then Aaron Johnson was gradually declined with falling and gait disturbance. Aaron Johnson had fall in Oct and again in Nov., when Aaron Johnson was admitted for right [facility] at the region of his old right MCA stroke. Repeat CT head stable, Aaron Johnson was discharged with discontinuation of dural antiplatelet for a week and resumed ASA 81mg  currently. Since discharge, pt was more sleepy and drowsy with eye closed but easily arousable. Most time spent in wheelchair. His recent ophthalmological exam showed bilateral blindness. Exam also showed not blinking to bilateral visual threat. Aaron Johnson is currently on ASA 81mg , lipitor and on vimpat.   Plan:  -  No orders of the defined types were placed in this encounter.    Meds ordered this encounter  Medications  . donepezil (ARICEPT) 5 MG tablet    Sig: Take 5 mg by mouth at bedtime.     Refill:  1  . QUEtiapine (SEROQUEL) 50 MG tablet    Sig: Take 50 mg by mouth 3 times/day as needed-between meals & bedtime.     Refill:  0  . traZODone (DESYREL)  100 MG tablet    Sig: Take 100 mg by mouth at bedtime as needed.     Refill:  3    Patient Instructions  I had a long discussion with the patient and daughter regarding his remote stroke, risk for recurrent stroke/TIA as well as stroke risk factors and need for aggressive risk factor control to prevent future strokes. Continue aspirin 81 mg daily and no need for Plavix given his history of fall and subarachnoid hemorrhage. Maintain strict control of lipids with LDL cholesterol goal below 70 mg percent. Strict control of hypertension with blood pressure goal below 130/90. Consider tapering and discontinuing Vimpat as patient has sleepiness which may be related to that and review of chart does not show definite evidence of clinical seizure in the past. Reduce Vimpat to 50 mg at night for 2 weeks and then stop I explained to the daughter clearly that there may be a risk for recurrent seizures and this happened patient may need to go back on the medication. Continue follow-up in the future only as necessary and no routine follow up appointment was made.    Antony Contras, MD [facility] [address] [place], [place] [address] [phone]

## 2014-04-16 NOTE — Patient Instructions (Signed)
I had a long discussion with the patient and daughter regarding his remote stroke, risk for recurrent stroke/TIA as well as stroke risk factors and need for aggressive risk factor control to prevent future strokes. Continue aspirin 81 mg daily and no need for Plavix given his history of fall and subarachnoid hemorrhage. Maintain strict control of lipids with LDL cholesterol goal below 70 mg percent. Strict control of hypertension with blood pressure goal below 130/90. Consider tapering and discontinuing Vimpat as patient has sleepiness which may be related to that and review of chart does not show definite evidence of clinical seizure in the past. Reduce Vimpat to 50 mg at night for 2 weeks and then stop I explained to the daughter clearly that there may be a risk for recurrent seizures and this happened patient may need to go back on the medication. Continue follow-up in the future only as necessary and no routine follow up appointment was made.

## 2014-07-15 ENCOUNTER — Encounter: Payer: Self-pay | Admitting: *Deleted

## 2014-08-27 NOTE — Progress Notes (Signed)
HPI: FU aortic valve replacement with a pericardial tissue valve. Note, at the time of his aortic valve replacement a catheterization in 1999 showed no obstructive disease prior to the procedure. His most recent echocardiogram was performed in July 2015 and showed EF 42-59, grade 1 diastolic dysfunction, AVR with elevated mean gradient of 28 mmHg and mild to moderate AI, mild RAE. Carotid dopplers 7/15 showed bilaeral 1-39 stenosis. Holter monitor August 2015 showed sinus rhythm with frequent PVCs, occasional couplet and rare triplet. There was a 3.38 second pause. Seeing neurology for CVA/TIA. Had [place] 11/15. Since I last saw him,   Current Outpatient Prescriptions  Medication Sig Dispense Refill  . albuterol-ipratropium (COMBIVENT) 18-103 MCG/ACT inhaler Inhale 1 puff into the lungs every 4 (four) hours as needed for wheezing or shortness of breath.     Marland Kitchen aspirin 81 MG tablet Take 1 tablet (81 mg total) by mouth daily. Resume after 7 days from 11/26/13 30 tablet   . atorvastatin (LIPITOR) 40 MG tablet Take 40 mg by mouth at bedtime.     . brimonidine (ALPHAGAN P) 0.1 % SOLN Place 1 drop into both eyes 2 (two) times daily.    Marland Kitchen docusate sodium (COLACE) 100 MG capsule Take 100 mg by mouth daily.    Marland Kitchen donepezil (ARICEPT) 5 MG tablet Take 5 mg by mouth at bedtime.   1  . dorzolamide (TRUSOPT) 2 % ophthalmic solution Place 1 drop into both eyes 3 (three) times daily.    . Fluticasone-Salmeterol (ADVAIR DISKUS) 250-50 MCG/DOSE AEPB Inhale 2 puffs into the lungs daily.     . furosemide (LASIX) 20 MG tablet Take 20 mg by mouth daily as needed for fluid or edema. For edema    . haloperidol (HALDOL) 0.5 MG tablet Take 1 tablet (0.5 mg total) by mouth 2 (two) times daily. (Patient taking differently: Take 0.5 mg by mouth 2 (two) times daily as needed. ) 30 tablet 6  . losartan (COZAAR) 25 MG tablet Take 25 mg by mouth daily.    . Multiple Vitamin (MULTIVITAMIN) tablet Take 1 tablet by mouth daily.       Marland Kitchen oxyCODONE-acetaminophen (PERCOCET) 5-325 MG per tablet Take 0.5-1 tablets by mouth every 4 (four) hours as needed for moderate pain.     . polyethylene glycol (MIRALAX / GLYCOLAX) packet Take 17 g by mouth daily.    . potassium chloride (KLOR-CON) 20 MEQ packet Take 20 mEq by mouth daily as needed (for edema when taking lasix).     . QUEtiapine (SEROQUEL) 50 MG tablet Take 50 mg by mouth 3 times/day as needed-between meals & bedtime.   0  . Solifenacin Succinate (VESICARE PO) Take 5 mg by mouth every morning.     . temazepam (RESTORIL) 15 MG capsule Take 15 mg by mouth at bedtime.    . Travoprost, BAK Free, (TRAVATAN) 0.004 % SOLN ophthalmic solution Place 1 drop into both eyes at bedtime.    . traZODone (DESYREL) 100 MG tablet Take 100 mg by mouth at bedtime as needed.   3   No current facility-administered medications for this visit.     Past Medical History  Diagnosis Date  . Acquired musculoskeletal deformity of unspecified site   . Chronic diastolic heart failure     EF >= 50%  . HTN (hypertension), benign     systematic  . Pure hypercholesterolemia   . COPD (chronic obstructive pulmonary disease)   . Benign neoplasm of colon   .  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 Iron deficiency anemia, unspecified   . LFTs abnormal 3/06    LFTs wnl, Cr - 1.3 stable. 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 colonoscopy 3/05- adenomatous polyps, mild gastritis-diffuse interosseous skeletal hypertosis (DISH), h/o H. pylori s/p tx.  . Stroke   . Hemiparesis, left   . Aortic valve insufficiency   . CHF (congestive heart failure)   . 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 Agitation     sun downing    Past Surgical History  Procedure Laterality Date  . Cardiac catheterization  1/99    mild CAD; EF 35-40%  . Aortic valve replacement       with pericardial tissue valve  . Cardiac valve replacement    . Multiple tooth extractions      Social History   Social History  . Marital Status: Widowed    Spouse Name: N/A  . Number of Children: 4  . Years of Education: 7   Occupational History  . disabled    Social History Main Topics  . Smoking status: Former Smoker    Types: Cigarettes  . Smokeless tobacco: Never Used     Comment: 10/13/2012 "smoked a little bit a long time ago"  . Alcohol Use: No     Comment: 10/13/2012 "haven't had anything to drink for years"  . Drug Use: No  . Sexual Activity: Not on file   Other Topics Concern  . Not on file   Social History Narrative   Children involved in his life. Lives with daughter, Levada Dy.    ROS: no fevers or chills, productive cough, hemoptysis, dysphasia, odynophagia, melena, hematochezia, dysuria, hematuria, rash, seizure activity, orthopnea, PND, pedal edema, claudication. Remaining systems are negative.  Physical Exam: Well-developed well-nourished in no acute distress.  Skin is warm and dry.  HEENT is normal.  Neck is supple.  Chest is clear to auscultation with normal expansion.  Cardiovascular exam is regular rate and rhythm.  Abdominal exam nontender or distended. No masses palpated. Extremities show no edema. neuro grossly intact  ECG     This encounter was created in error - please disregard.

## 2014-08-30 ENCOUNTER — Encounter: Payer: Medicare Other | Admitting: Cardiology

## 2014-09-10 ENCOUNTER — Ambulatory Visit (INDEPENDENT_AMBULATORY_CARE_PROVIDER_SITE_OTHER): Payer: Medicare Other | Admitting: Cardiology

## 2014-09-10 ENCOUNTER — Encounter: Payer: Self-pay | Admitting: Cardiology

## 2014-09-10 VITALS — BP 116/54 | HR 75 | Ht 66.0 in

## 2014-09-10 DIAGNOSIS — I10 Essential (primary) hypertension: Secondary | ICD-10-CM

## 2014-09-10 DIAGNOSIS — I420 Dilated cardiomyopathy: Secondary | ICD-10-CM

## 2014-09-10 DIAGNOSIS — E7800 Pure hypercholesterolemia, unspecified: Secondary | ICD-10-CM

## 2014-09-10 DIAGNOSIS — Z952 Presence of prosthetic heart valve: Secondary | ICD-10-CM

## 2014-09-10 DIAGNOSIS — I480 Paroxysmal atrial fibrillation: Secondary | ICD-10-CM

## 2014-09-10 DIAGNOSIS — Z954 Presence of other heart-valve replacement: Secondary | ICD-10-CM | POA: Diagnosis not present

## 2014-09-10 NOTE — Patient Instructions (Signed)
Your physician wants you to follow-up in: 1 year. You will receive a reminder letter in the mail two months in advance. If you don't receive a letter, please call our office to schedule the follow-up appointment.  

## 2014-09-10 NOTE — Assessment & Plan Note (Signed)
Blood pressure controlled. Continue present medications. 

## 2014-09-10 NOTE — Assessment & Plan Note (Signed)
I discussed his aortic valve with his daughter today. He has dementia, is wheelchair-bound and has had progressive decline. I do not think he would ever be a candidate for aortic valve replacement. I therefore feel that we should not pursue follow-up echoes. She is in agreement. She also states he would like to be a no CODE BLUE including no defibrillation, CPR or intubation.

## 2014-09-10 NOTE — Progress Notes (Signed)
HPI: FU aortic valve replacement (pericardial tissue valve) and cardiomyopathy. Note, at the time of his aortic valve replacement in 1999 a catheterization showed no obstructive disease. His most recent echocardiogram was performed in July 2015 and showed EF 54-65, grade 1 diastolic dysfunction, AVR with elevated mean gradient of 28 mmHg and mild to moderate AI, mild RAE. Carotid dopplers 7/15 showed bilaeral 1-39 stenosis. Holter monitor August 2015 showed sinus rhythm with frequent PVCs, occasional couplet and rare triplet. There was a 3.38 second pause. Since I last saw him, patient denies dyspnea, chest pain, palpitations or syncope. No pedal edema.  Current Outpatient Prescriptions  Medication Sig Dispense Refill  . albuterol-ipratropium (COMBIVENT) 18-103 MCG/ACT inhaler Inhale 1 puff into the lungs every 4 (four) hours as needed for wheezing or shortness of breath.     Marland Kitchen aspirin 81 MG tablet Take 1 tablet (81 mg total) by mouth daily. Resume after 7 days from 11/26/13 30 tablet   . atorvastatin (LIPITOR) 40 MG tablet Take 40 mg by mouth at bedtime.     . brimonidine (ALPHAGAN P) 0.1 % SOLN Place 1 drop into both eyes 2 (two) times daily.    Marland Kitchen docusate sodium (COLACE) 100 MG capsule Take 100 mg by mouth daily.    Marland Kitchen donepezil (ARICEPT) 10 MG tablet Take 1 tablet by mouth at bedtime.    . dorzolamide (TRUSOPT) 2 % ophthalmic solution Place 1 drop into both eyes 3 (three) times daily.    . dorzolamide-timolol (COSOPT) 22.3-6.8 MG/ML ophthalmic solution Place 1 drop into both eyes 2 (two) times daily.  1  . Fluticasone-Salmeterol (ADVAIR DISKUS) 250-50 MCG/DOSE AEPB Inhale 2 puffs into the lungs daily.     . furosemide (LASIX) 20 MG tablet Take 20 mg by mouth daily as needed for fluid or edema. For edema    . haloperidol (HALDOL) 0.5 MG tablet Take 1 tablet (0.5 mg total) by mouth 2 (two) times daily. (Patient taking differently: Take 0.5 mg by mouth 2 (two) times daily as needed. ) 30  tablet 6  . losartan (COZAAR) 25 MG tablet Take 25 mg by mouth daily.    . Multiple Vitamin (MULTIVITAMIN) tablet Take 1 tablet by mouth daily.      Marland Kitchen oxyCODONE-acetaminophen (PERCOCET) 5-325 MG per tablet Take 0.5-1 tablets by mouth every 4 (four) hours as needed for moderate pain.     . polyethylene glycol (MIRALAX / GLYCOLAX) packet Take 17 g by mouth daily.    . potassium chloride (KLOR-CON) 20 MEQ packet Take 20 mEq by mouth daily as needed (for edema when taking lasix).     . QUEtiapine (SEROQUEL) 50 MG tablet Take 50 mg by mouth 3 times/day as needed-between meals & bedtime.   0  . Solifenacin Succinate (VESICARE PO) Take 5 mg by mouth every morning.     . temazepam (RESTORIL) 15 MG capsule Take 15 mg by mouth at bedtime.    . Travoprost, BAK Free, (TRAVATAN) 0.004 % SOLN ophthalmic solution Place 1 drop into both eyes at bedtime.    . traZODone (DESYREL) 100 MG tablet Take 100 mg by mouth at bedtime as needed.   3   No current facility-administered medications for this visit.     Past Medical History  Diagnosis Date  . Acquired musculoskeletal deformity of unspecified site   . Chronic diastolic heart failure     EF >= 50%  . HTN (hypertension), benign     systematic  . Pure  hypercholesterolemia   . COPD (chronic obstructive pulmonary disease)   . Benign neoplasm of colon   . 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 Iron deficiency anemia, unspecified   . LFTs abnormal 3/06    LFTs wnl, Cr - 1.3 stable. 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 colonoscopy 3/05- adenomatous polyps, mild gastritis-diffuse interosseous skeletal hypertosis (DISH), h/o H. pylori s/p tx.  . Stroke   . Hemiparesis, left   . Aortic valve insufficiency   . CHF (congestive heart failure)   . 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 Agitation     sun downing      Past Surgical History  Procedure Laterality Date  . Cardiac catheterization  1/99    mild CAD; EF 35-40%  . Aortic valve replacement      with pericardial tissue valve  . Cardiac valve replacement    . Multiple tooth extractions      Social History   Social History  . Marital Status: Widowed    Spouse Name: N/A  . Number of Children: 4  . Years of Education: 7   Occupational History  . disabled    Social History Main Topics  . Smoking status: Former Smoker    Types: Cigarettes  . Smokeless tobacco: Never Used     Comment: 10/13/2012 "smoked a little bit a long time ago"  . Alcohol Use: No     Comment: 10/13/2012 "haven't had anything to drink for years"  . Drug Use: No  . Sexual Activity: Not on file   Other Topics Concern  . Not on file   Social History Narrative   Children involved in his life. Lives with daughter, Levada Dy.    ROS: no fevers or chills, productive cough, hemoptysis, dysphasia, odynophagia, melena, hematochezia, dysuria, hematuria, rash, seizure activity, orthopnea, PND, pedal edema, claudication. Remaining systems are negative.  Physical Exam: Well-developed frail in no acute distress.  Skin is warm and dry.  HEENT is normal.  Neck is supple.  Chest is clear to auscultation with normal expansion.  Cardiovascular exam is regular rate and rhythm. 2/6 systolic murmur LSB Abdominal exam nontender or distended. No masses palpated. Extremities show no edema. neuro grossly intact  ECG sinus rhythm with first-degree AV block, occasional PVC, left ventricular hypertrophy, nonspecific ST changes, septal infarct.

## 2014-09-10 NOTE — Assessment & Plan Note (Signed)
Continue statin. 

## 2014-09-10 NOTE — Assessment & Plan Note (Signed)
Continue ARB. No beta blocker given history of first degree AV block and pause on monitor.

## 2014-11-07 ENCOUNTER — Other Ambulatory Visit: Payer: Self-pay | Admitting: Internal Medicine

## 2014-11-07 ENCOUNTER — Ambulatory Visit
Admission: RE | Admit: 2014-11-07 | Discharge: 2014-11-07 | Disposition: A | Discharge status: Home / Self Care | Payer: Medicare Other | Source: Ambulatory Visit | Attending: Internal Medicine | Admitting: Internal Medicine

## 2014-11-07 DIAGNOSIS — R059 Cough, unspecified: Secondary | ICD-10-CM

## 2014-11-07 DIAGNOSIS — R05 Cough: Secondary | ICD-10-CM

## 2014-11-15 ENCOUNTER — Encounter (HOSPITAL_COMMUNITY): Payer: Self-pay | Admitting: Nurse Practitioner

## 2014-11-15 ENCOUNTER — Emergency Department (HOSPITAL_COMMUNITY): Payer: Medicare Other

## 2014-11-15 ENCOUNTER — Emergency Department (HOSPITAL_COMMUNITY)
Admission: EM | Admit: 2014-11-15 | Discharge: 2014-11-15 | Disposition: A | Discharge status: Home / Self Care | Payer: Medicare Other | Attending: Emergency Medicine | Admitting: Emergency Medicine

## 2014-11-15 DIAGNOSIS — I10 Essential (primary) hypertension: Secondary | ICD-10-CM | POA: Insufficient documentation

## 2014-11-15 DIAGNOSIS — J449 Chronic obstructive pulmonary disease, unspecified: Secondary | ICD-10-CM | POA: Insufficient documentation

## 2014-11-15 DIAGNOSIS — Z8673 Personal history of transient ischemic attack (TIA), and cerebral infarction without residual deficits: Secondary | ICD-10-CM | POA: Insufficient documentation

## 2014-11-15 DIAGNOSIS — I5032 Chronic diastolic (congestive) heart failure: Secondary | ICD-10-CM | POA: Insufficient documentation

## 2014-11-15 DIAGNOSIS — R319 Hematuria, unspecified: Secondary | ICD-10-CM

## 2014-11-15 DIAGNOSIS — H5442 Blindness, left eye, normal vision right eye: Secondary | ICD-10-CM | POA: Insufficient documentation

## 2014-11-15 DIAGNOSIS — Z87891 Personal history of nicotine dependence: Secondary | ICD-10-CM | POA: Insufficient documentation

## 2014-11-15 DIAGNOSIS — Z86018 Personal history of other benign neoplasm: Secondary | ICD-10-CM | POA: Diagnosis not present

## 2014-11-15 DIAGNOSIS — F0390 Unspecified dementia without behavioral disturbance: Secondary | ICD-10-CM | POA: Insufficient documentation

## 2014-11-15 DIAGNOSIS — Z79899 Other long term (current) drug therapy: Secondary | ICD-10-CM | POA: Diagnosis not present

## 2014-11-15 DIAGNOSIS — Z862 Personal history of diseases of the blood and blood-forming organs and certain disorders involving the immune mechanism: Secondary | ICD-10-CM | POA: Insufficient documentation

## 2014-11-15 LAB — COMPREHENSIVE METABOLIC PANEL
ALT: 16 U/L — AB (ref 17–63)
AST: 21 U/L (ref 15–41)
Albumin: 3.5 g/dL (ref 3.5–5.0)
Alkaline Phosphatase: 71 U/L (ref 38–126)
Anion gap: 8 (ref 5–15)
BILIRUBIN TOTAL: 0.9 mg/dL (ref 0.3–1.2)
BUN: 16 mg/dL (ref 6–20)
CO2: 23 mmol/L (ref 22–32)
Calcium: 8.8 mg/dL — ABNORMAL LOW (ref 8.9–10.3)
Chloride: 110 mmol/L (ref 101–111)
Creatinine, Ser: 0.84 mg/dL (ref 0.61–1.24)
GFR calc Af Amer: >60 mL/min (ref >60)
GLUCOSE: 88 mg/dL (ref 65–99)
Potassium: 3.9 mmol/L (ref 3.5–5.1)
Sodium: 141 mmol/L (ref 135–145)
TOTAL PROTEIN: 7 g/dL (ref 6.5–8.1)

## 2014-11-15 LAB — URINALYSIS, ROUTINE W REFLEX MICROSCOPIC
Bilirubin Urine: NEGATIVE
Glucose, UA: NEGATIVE mg/dL
Ketones, ur: 15 mg/dL — AB
Leukocytes, UA: NEGATIVE
Nitrite: NEGATIVE
PH: 6.5 (ref 5.0–8.0)
Protein, ur: NEGATIVE mg/dL
SPECIFIC GRAVITY, URINE: 1.015 (ref 1.005–1.030)
Urobilinogen, UA: 1 mg/dL (ref 0.0–1.0)

## 2014-11-15 LAB — CBC WITH DIFFERENTIAL/PLATELET
BASOS ABS: 0 10*3/uL (ref 0.0–0.1)
Basophils Relative: 0 %
EOS ABS: 0.3 10*3/uL (ref 0.0–0.7)
EOS PCT: 4 %
HCT: 33.4 % — ABNORMAL LOW (ref 39.0–52.0)
Hemoglobin: 10.6 g/dL — ABNORMAL LOW (ref 13.0–17.0)
Lymphocytes Relative: 27 %
Lymphs Abs: 1.9 10*3/uL (ref 0.7–4.0)
MCH: 28.2 pg (ref 26.0–34.0)
MCHC: 31.7 g/dL (ref 30.0–36.0)
MCV: 88.8 fL (ref 78.0–100.0)
MONO ABS: 0.7 10*3/uL (ref 0.1–1.0)
Monocytes Relative: 11 %
Neutro Abs: 4.1 10*3/uL (ref 1.7–7.7)
Neutrophils Relative %: 59 %
Platelets: 114 10*3/uL — ABNORMAL LOW (ref 150–400)
RBC: 3.76 MIL/uL — AB (ref 4.22–5.81)
RDW: 14.1 % (ref 11.5–15.5)
WBC: 7 10*3/uL (ref 4.0–10.5)

## 2014-11-15 LAB — URINE MICROSCOPIC-ADD ON

## 2014-11-15 MED ORDER — IOHEXOL 300 MG/ML  SOLN: 100.0000 mL | Freq: Once | INTRAMUSCULAR | Status: DC | PRN

## 2014-11-15 NOTE — ED Notes (Signed)
Phlebotomy at the bedside  

## 2014-11-15 NOTE — ED Provider Notes (Signed)
CSN: [id number]     Arrival date & time 11/15/14  1304 History   First MD Initiated Contact with Patient 11/15/14 1632     Chief Complaint  Patient presents with  . Hematuria     (Consider location/radiation/quality/duration/timing/severity/associated sxs/prior Treatment) Patient is a 81 y.o. male presenting with hematuria. The history is provided by the patient and a relative.  Hematuria   level V caveat due to dementia Patient presents with hematuria. Found to have blood in his diaper. He has dementia and has not been complaining of anything. No other bleeding. Had blood in his meatus. No trauma. Is on a antibiotics for a pneumonia that has had approximately 2 days. He is not on anticoagulation. Past Medical History  Diagnosis Date  . Acquired musculoskeletal deformity of unspecified site   . Chronic diastolic heart failure (HCC)     EF >= 50%  . HTN (hypertension), benign     systematic  . Pure hypercholesterolemia   . COPD (chronic obstructive pulmonary disease) ([place])   . Benign neoplasm of colon   . 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 Iron deficiency anemia, unspecified   . LFTs abnormal 3/06    LFTs wnl, Cr - 1.3 stable. 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 colonoscopy 3/05- adenomatous polyps, mild gastritis-diffuse interosseous skeletal hypertosis (DISH), h/o H. pylori s/p tx.  . Stroke ([place])   . Hemiparesis, left ([place])   . Aortic valve insufficiency   . CHF (congestive heart failure) ([place])   . 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 Agitation     sun downing   Past Surgical History  Procedure Laterality Date  . Cardiac catheterization  1/99    mild CAD; EF 35-40%  . Aortic valve replacement      with pericardial tissue valve  . Cardiac valve replacement    . Multiple tooth extractions     Family History   Problem Relation Age of Onset  . Colon cancer Neg Hx   . Heart disease Father    Social History  Substance Use Topics  . Smoking status: Former Smoker    Types: Cigarettes  . Smokeless tobacco: Never Used     Comment: 10/13/2012 "smoked a little bit a long time ago"  . Alcohol Use: No     Comment: 10/13/2012 "haven't had anything to drink for years"    Review of Systems  Unable to perform ROS: Dementia  Constitutional: Negative for appetite change.  Respiratory: Negative for chest tightness.   Cardiovascular: Negative for palpitations.  Genitourinary: Positive for hematuria. Negative for flank pain.      Allergies  Review of patient's allergies indicates no known allergies.  Home Medications   Prior to Admission medications   Medication Sig Start Date End Date Taking? Authorizing Provider  amoxicillin-clavulanate (AUGMENTIN) 875-125 MG tablet Take 1 tablet by mouth 2 (two) times daily. Started 11/13/14, for 7 days ending 11/19/14   Yes Historical Provider, MD  aspirin 81 MG tablet Take 1 tablet (81 mg total) by mouth daily. Resume after 7 days from 11/26/13 11/26/13  Yes Shanker Kristeen Mans, MD  atorvastatin (LIPITOR) 40 MG tablet Take 40 mg by mouth at bedtime.    Yes Historical Provider, MD  brimonidine (ALPHAGAN P) 0.1 % SOLN Place 1 drop into the right eye 2 (two)  times daily.    Yes Historical Provider, MD  docusate sodium (COLACE) 100 MG capsule Take 100 mg by mouth daily.   Yes Historical Provider, MD  donepezil (ARICEPT) 10 MG tablet Take 1 tablet by mouth at bedtime. 08/24/14  Yes Historical Provider, MD  dorzolamide-timolol (COSOPT) 22.3-6.8 MG/ML ophthalmic solution Place 1 drop into the right eye 2 (two) times daily.  06/16/14  Yes Historical Provider, MD  Fluticasone-Salmeterol (ADVAIR DISKUS) 250-50 MCG/DOSE AEPB Inhale 1 puff into the lungs 2 (two) times daily.    Yes Historical Provider, MD  furosemide (LASIX) 20 MG tablet Take 20 mg by mouth daily as needed for  fluid or edema. For edema   Yes Historical Provider, MD  losartan (COZAAR) 25 MG tablet Take 25 mg by mouth daily.   Yes Historical Provider, MD  Multiple Vitamin (MULTIVITAMIN) tablet Take 1 tablet by mouth daily.     Yes Historical Provider, MD  oxyCODONE-acetaminophen (PERCOCET) 5-325 MG per tablet Take 0.5-1 tablets by mouth every 4 (four) hours as needed for moderate pain.    Yes Historical Provider, MD  polyethylene glycol (MIRALAX / GLYCOLAX) packet Take 17 g by mouth daily.   Yes Historical Provider, MD  potassium chloride (KLOR-CON) 20 MEQ packet Take 20 mEq by mouth daily as needed (for edema when taking lasix).    Yes Historical Provider, MD  QUEtiapine (SEROQUEL) 25 MG tablet Take 12.5-25 mg by mouth at bedtime. Depending on severity of sundowning   Yes Historical Provider, MD  solifenacin (VESICARE) 5 MG tablet Take 5 mg by mouth daily.   Yes Historical Provider, MD  Travoprost, BAK Free, (TRAVATAN) 0.004 % SOLN ophthalmic solution Place 1 drop into the right eye at bedtime.    Yes Historical Provider, MD  traZODone (DESYREL) 100 MG tablet Take 50-100 mg by mouth at bedtime as needed for sleep.  01/24/14  Yes Historical Provider, MD  haloperidol (HALDOL) 0.5 MG tablet Take 1 tablet (0.5 mg total) by mouth 2 (two) times daily. Patient taking differently: Take 0.5 mg by mouth 2 (two) times daily as needed.  12/25/13   Luke K Kilroy, PA-C   BP 156/51 mmHg  Pulse 74  Temp(Src) 98.7 F (37.1 C) (Oral)  Resp 18  SpO2 100% Physical Exam  Constitutional: He appears well-developed.  HENT:  Head: Atraumatic.  Neck: Neck supple.  Cardiovascular: Normal rate.   Pulmonary/Chest: Effort normal. He has no wheezes. He has no rales.  Abdominal: Soft. There is no tenderness.  Genitourinary:  Circumcised. Some bloody urine at his meatus. Blood in his diaper  Musculoskeletal: Normal range of motion.  Neurological: He is alert.  Skin: Skin is warm.    ED Course  Procedures (including  critical care time) Labs Review Labs Reviewed  URINALYSIS, ROUTINE W REFLEX MICROSCOPIC (NOT AT [facility]) - Abnormal; Notable for the following:    Hgb urine dipstick LARGE (*)    Ketones, ur 15 (*)    All other components within normal limits  COMPREHENSIVE METABOLIC PANEL - Abnormal; Notable for the following:    Calcium 8.8 (*)    ALT 16 (*)    All other components within normal limits  CBC WITH DIFFERENTIAL/PLATELET - Abnormal; Notable for the following:    RBC 3.76 (*)    Hemoglobin 10.6 (*)    HCT 33.4 (*)    Platelets 114 (*)    All other components within normal limits  URINE MICROSCOPIC-ADD ON    Imaging Review Ct Abdomen Pelvis Wo Contrast  11/15/2014  CLINICAL DATA:  Mid abdominal pain and swelling since yesterday. Hematuria. Unable to obtain IV access for intravenous contrast. EXAM: CT ABDOMEN AND PELVIS WITHOUT CONTRAST TECHNIQUE: Multidetector CT imaging of the abdomen and pelvis was performed following the standard protocol without IV contrast. COMPARISON:  None. FINDINGS: Lung bases demonstrate mild dependent bibasilar atelectasis. Sternotomy wires are present. Prosthetic aortic valve is present. Moderate calcification of the coronary arteries. Abdominal images demonstrate mild cholelithiasis. The liver, spleen, pancreas and adrenal glands are within normal. Appendix is not visualized. Kidneys are normal in size with multiple bilateral cysts. No hydronephrosis or nephrolithiasis. There is no evidence of free fluid or free peritoneal air. Small bowel is within normal. Mild fecal retention throughout the colon most prominent over the rectum. Moderate calcified plaque over the abdominal aorta and iliac arteries. Moderate calcified plaque at the takeoff of the superior mesenteric artery and renal arteries. Pelvic images demonstrate the bladder and prostate to be within normal. No free fluid. There are moderate degenerate changes of the spine and mild degenerate change of the hips.  IMPRESSION: No acute findings in the abdomen/ pelvis. Mild cholelithiasis. Bilateral renal cysts. Electronically Signed   By: Marin Olp M.D.   On: 11/15/2014 21:54   I have personally reviewed and evaluated these images and lab results as part of my medical decision-making.   EKG Interpretation None      MDM   Final diagnoses:  Hematuria    Patient with hematuria. No infection. Labs reassuring. CT noncontrast due to IV access. D/c with urology follow up.    Davonna Belling, MD 11/15/14 [phone]

## 2014-11-15 NOTE — Discharge Instructions (Signed)

## 2014-11-15 NOTE — ED Notes (Addendum)
Pt has hx dementia, stroke and she cares for him at home. Today while doing bath she noticed blood oozing from penis. She noticed since yesterday hes been "pulling at his pants and scratching that area." he denies any pain, n/v/, fevers. He normally has urinary incontinence. He is currently on oral abx for pneumonia at home by pcp

## 2015-03-27 ENCOUNTER — Encounter: Payer: Self-pay | Admitting: Gastroenterology

## 2015-07-30 IMAGING — CR DG LUMBAR SPINE COMPLETE 4+V
7 series · 7 of 7 positions shown · non-contrast
Comparison: Lumbar spine radiographs March 23, 2012

CLINICAL DATA: Fall, low back pain.

EXAM:
LUMBAR SPINE - COMPLETE 4+ VIEW

[t lumbar spine ap]
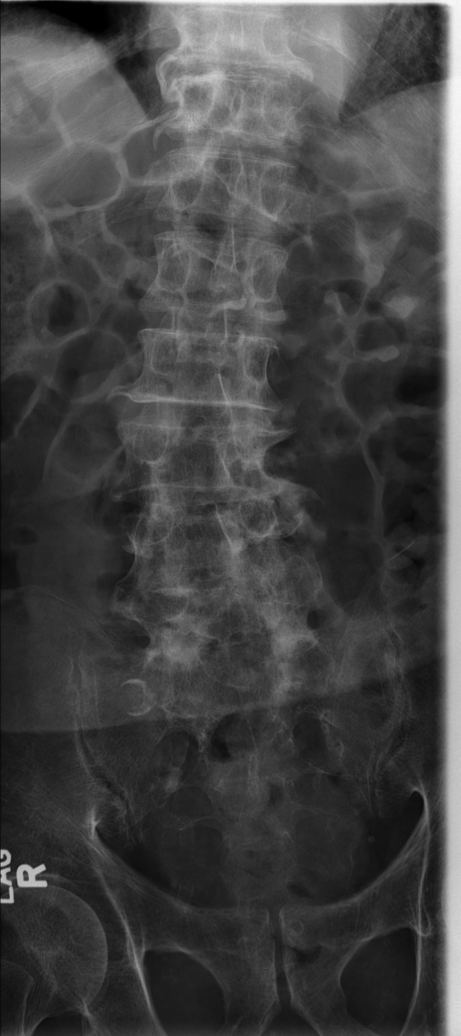

[t lumbar spine obl (1 of 2)]
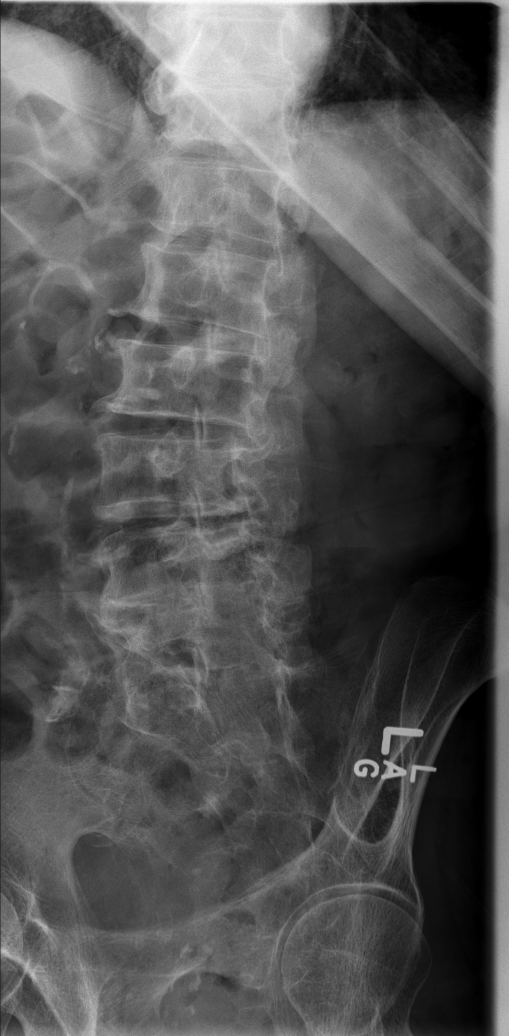

[t lumbar spine obl (2 of 2)]
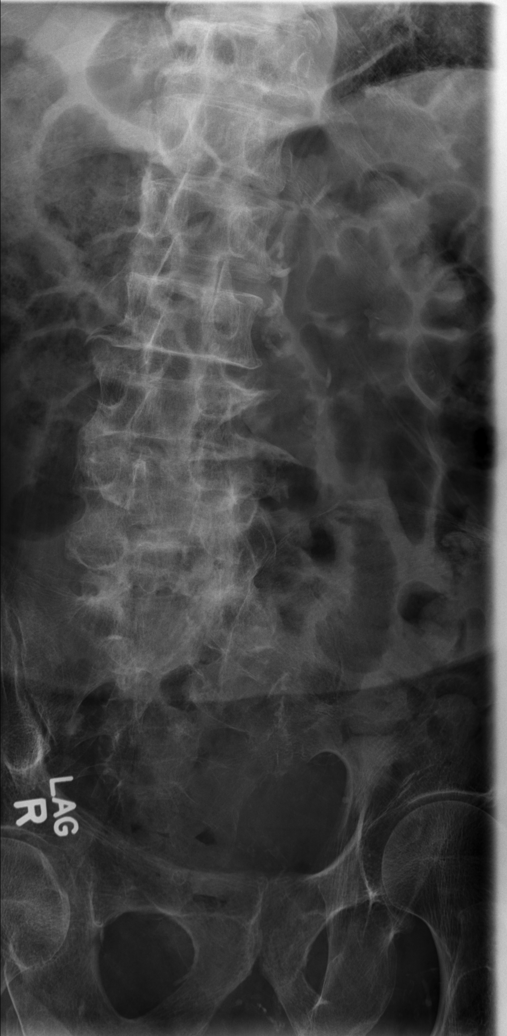

[t lumbar spine lat (1 of 2)]
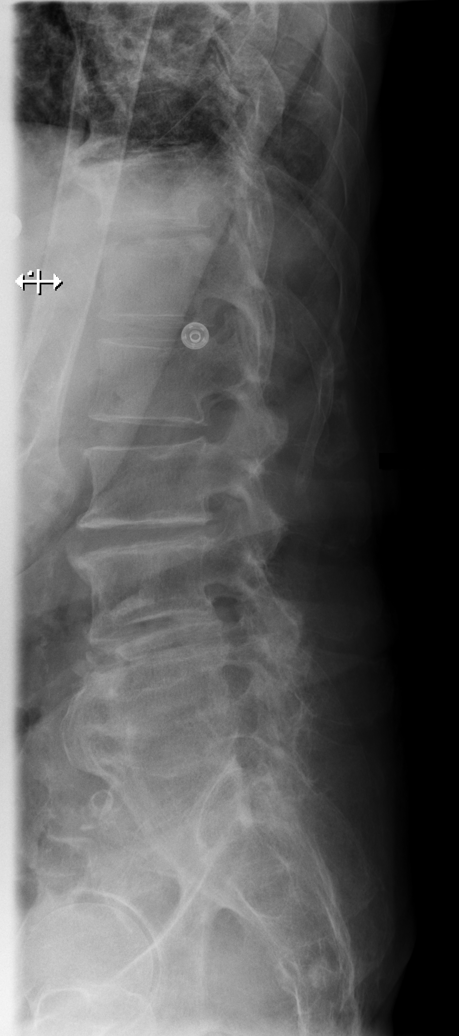

[t lumbar spine lat (2 of 2)]
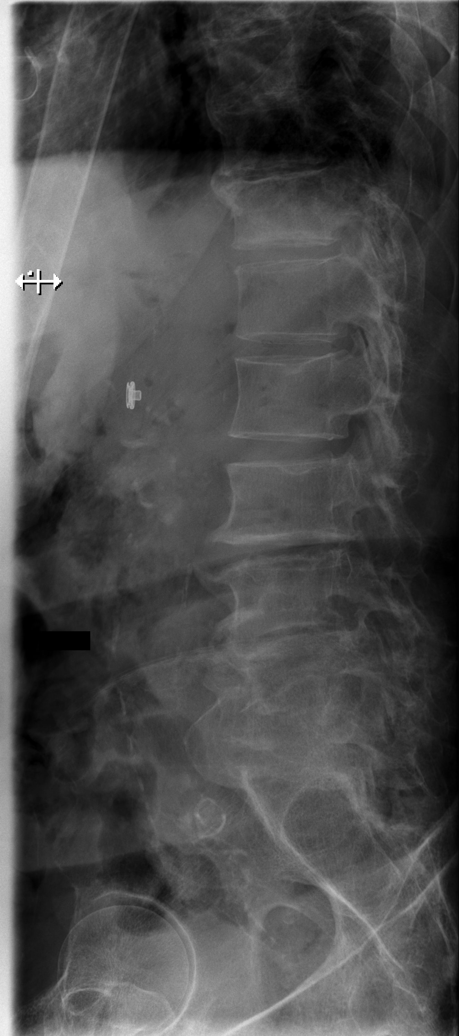

[t lumbar l-5 s-1 spot (1 of 2)]
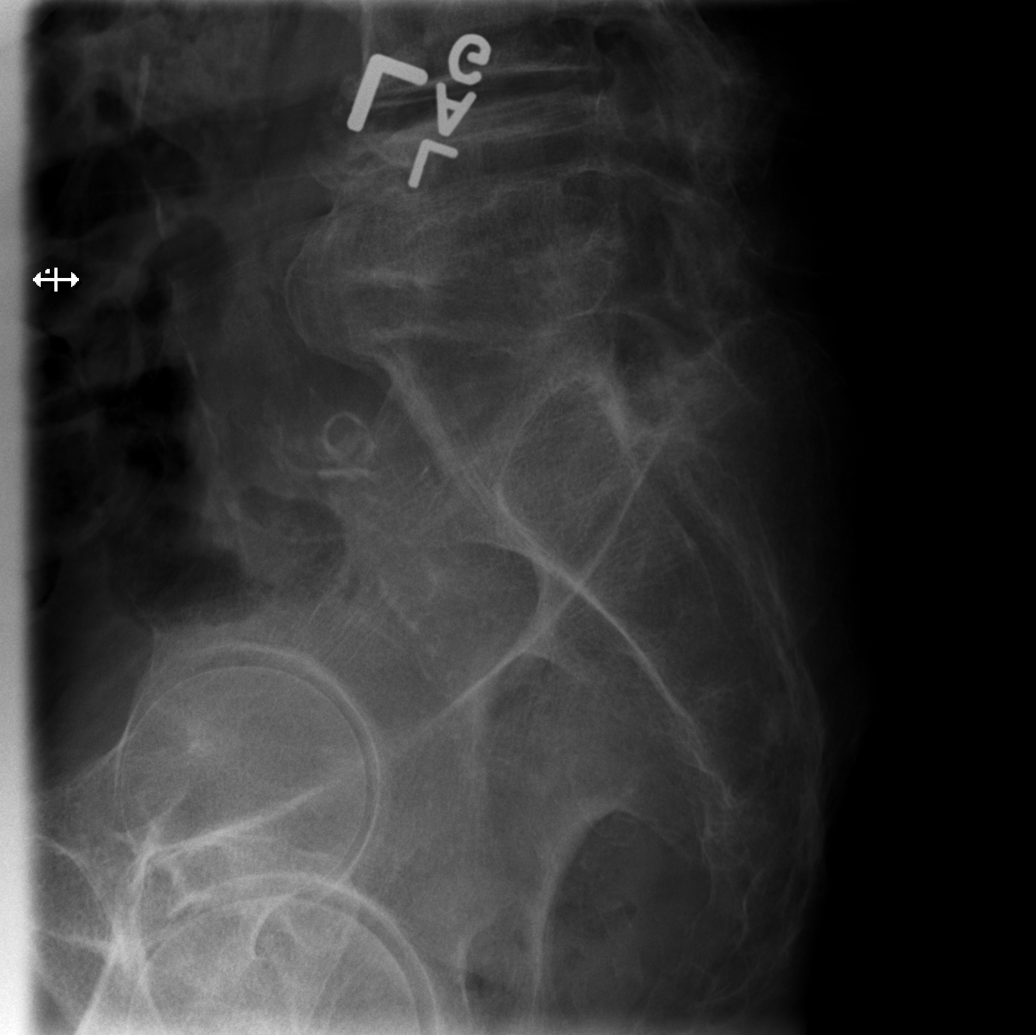

[t lumbar l-5 s-1 spot (2 of 2)]
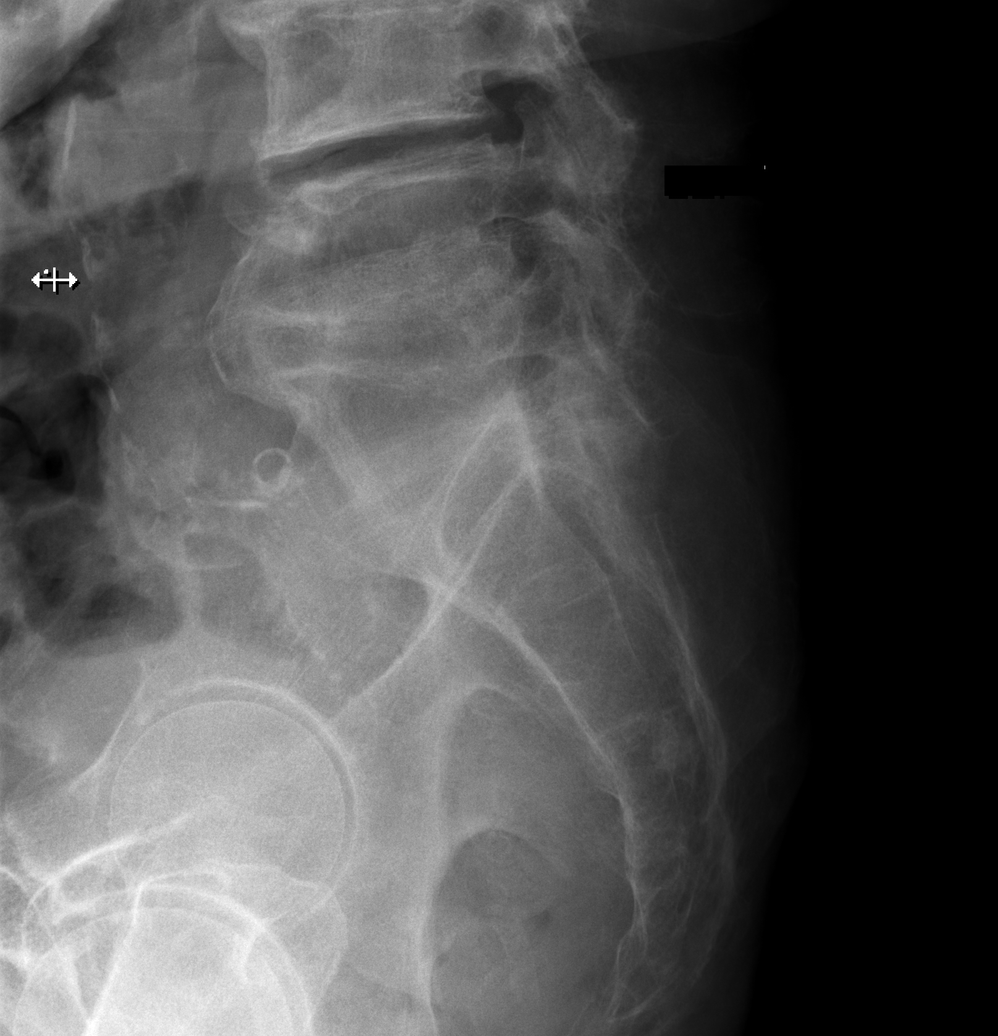

[7 of 7 positions shown; findings below may reference images not displayed]

FINDINGS: Lumbar vertebral bodies appear intact. Relatively straightened
lumbar lordosis. Severe L5-S1 disc degeneration with interbody
arthrodesis. Moderate L3-4 and L4-5 disc degeneration. Bone mineral
density is decreased without destructive bony lesions. No convincing
evidence of pars interarticularis defects. Possible L5-S1
laminectomy. Mild broad dextroscoliosis is similar. Severe lower
lumbar facet arthropathy.

Mild vascular calcifications.
IMPRESSION: Similar degenerative change without acute fracture deformity or
malalignment.

  By: Edwards Bacon

## 2015-07-30 IMAGING — CR DG PELVIS 1-2V
1 series · 1 of 1 positions shown · non-contrast
Comparison: LEFT hip radiograph March 23, 2012.
COMPARISON: None.

[t pelvis ap]
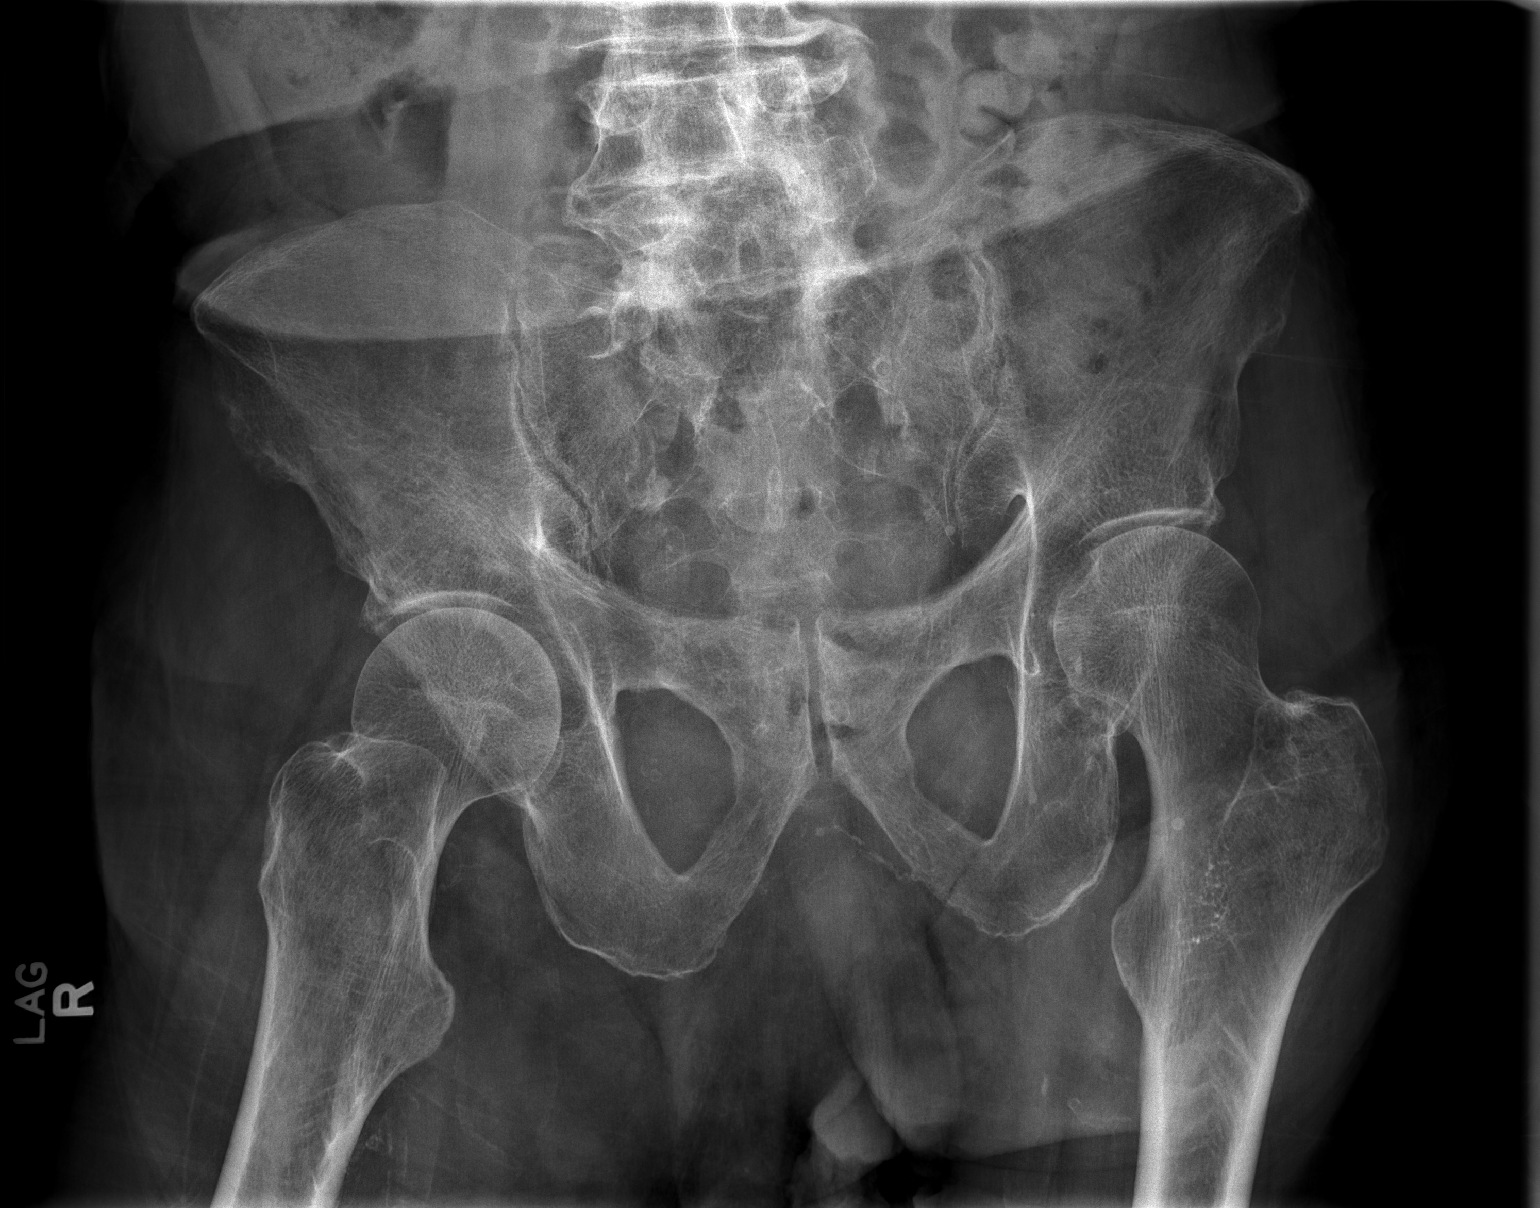

[1 of 1 positions shown; findings below may reference images not displayed]

By: [REDACTED]AL DATA:  Fall with pelvic pain.  Unable to straighten leg.

EXAM:
PELVIS - 1-2 VIEW
FINDINGS: There is no evidence of pelvic fracture or diastasis. No pelvic bone
lesions are seen. Osteopenia. Mild vascular calcifications.
IMPRESSION: No acute fracture deformity or dislocation.

Osteopenia, decreases sensitivity for acute nondisplaced fractures.

By: Wenny Billy [place]auw

## 2016-03-22 ENCOUNTER — Other Ambulatory Visit: Payer: Self-pay | Admitting: Internal Medicine

## 2016-03-22 ENCOUNTER — Ambulatory Visit
Admission: RE | Admit: 2016-03-22 | Discharge: 2016-03-22 | Disposition: A | Discharge status: Home / Self Care | Payer: Medicare Other | Source: Ambulatory Visit | Attending: Internal Medicine | Admitting: Internal Medicine

## 2016-03-22 DIAGNOSIS — R059 Cough, unspecified: Secondary | ICD-10-CM

## 2016-03-22 DIAGNOSIS — R05 Cough: Secondary | ICD-10-CM

## 2016-04-04 DIAGNOSIS — J189 Pneumonia, unspecified organism: Secondary | ICD-10-CM

## 2016-04-04 HISTORY — DX: Pneumonia, unspecified organism: J18.9

## 2016-04-15 ENCOUNTER — Encounter (HOSPITAL_COMMUNITY): Payer: Self-pay | Admitting: Family Medicine

## 2016-04-15 ENCOUNTER — Inpatient Hospital Stay (HOSPITAL_COMMUNITY)
Admission: EM | Admit: 2016-04-15 | Discharge: 2016-04-19 | DRG: 871 | Disposition: A | Discharge status: Home / Self Care | Payer: Medicare Other | Attending: Internal Medicine | Admitting: Internal Medicine

## 2016-04-15 ENCOUNTER — Emergency Department (HOSPITAL_COMMUNITY): Payer: Medicare Other

## 2016-04-15 DIAGNOSIS — Z87891 Personal history of nicotine dependence: Secondary | ICD-10-CM | POA: Diagnosis not present

## 2016-04-15 DIAGNOSIS — Z953 Presence of xenogenic heart valve: Secondary | ICD-10-CM | POA: Diagnosis not present

## 2016-04-15 DIAGNOSIS — A419 Sepsis, unspecified organism: Secondary | ICD-10-CM | POA: Diagnosis not present

## 2016-04-15 DIAGNOSIS — E785 Hyperlipidemia, unspecified: Secondary | ICD-10-CM | POA: Diagnosis present

## 2016-04-15 DIAGNOSIS — Z8249 Family history of ischemic heart disease and other diseases of the circulatory system: Secondary | ICD-10-CM | POA: Diagnosis not present

## 2016-04-15 DIAGNOSIS — D509 Iron deficiency anemia, unspecified: Secondary | ICD-10-CM | POA: Diagnosis present

## 2016-04-15 DIAGNOSIS — H5461 Unqualified visual loss, right eye, normal vision left eye: Secondary | ICD-10-CM | POA: Diagnosis present

## 2016-04-15 DIAGNOSIS — R32 Unspecified urinary incontinence: Secondary | ICD-10-CM | POA: Diagnosis present

## 2016-04-15 DIAGNOSIS — E7800 Pure hypercholesterolemia, unspecified: Secondary | ICD-10-CM | POA: Diagnosis present

## 2016-04-15 DIAGNOSIS — J189 Pneumonia, unspecified organism: Secondary | ICD-10-CM | POA: Diagnosis not present

## 2016-04-15 DIAGNOSIS — F03918 Unspecified dementia, unspecified severity, with other behavioral disturbance: Secondary | ICD-10-CM | POA: Diagnosis present

## 2016-04-15 DIAGNOSIS — R6510 Systemic inflammatory response syndrome (SIRS) of non-infectious origin without acute organ dysfunction: Secondary | ICD-10-CM | POA: Diagnosis present

## 2016-04-15 DIAGNOSIS — I420 Dilated cardiomyopathy: Secondary | ICD-10-CM | POA: Diagnosis present

## 2016-04-15 DIAGNOSIS — J181 Lobar pneumonia, unspecified organism: Secondary | ICD-10-CM | POA: Diagnosis not present

## 2016-04-15 DIAGNOSIS — J440 Chronic obstructive pulmonary disease with acute lower respiratory infection: Secondary | ICD-10-CM | POA: Diagnosis present

## 2016-04-15 DIAGNOSIS — K5900 Constipation, unspecified: Secondary | ICD-10-CM | POA: Diagnosis present

## 2016-04-15 DIAGNOSIS — I10 Essential (primary) hypertension: Secondary | ICD-10-CM | POA: Diagnosis not present

## 2016-04-15 DIAGNOSIS — I69354 Hemiplegia and hemiparesis following cerebral infarction affecting left non-dominant side: Secondary | ICD-10-CM

## 2016-04-15 DIAGNOSIS — Z681 Body mass index (BMI) 19 or less, adult: Secondary | ICD-10-CM

## 2016-04-15 DIAGNOSIS — I480 Paroxysmal atrial fibrillation: Secondary | ICD-10-CM | POA: Diagnosis present

## 2016-04-15 DIAGNOSIS — D649 Anemia, unspecified: Secondary | ICD-10-CM | POA: Diagnosis not present

## 2016-04-15 DIAGNOSIS — I110 Hypertensive heart disease with heart failure: Secondary | ICD-10-CM | POA: Diagnosis present

## 2016-04-15 DIAGNOSIS — I248 Other forms of acute ischemic heart disease: Secondary | ICD-10-CM | POA: Diagnosis present

## 2016-04-15 DIAGNOSIS — F0391 Unspecified dementia with behavioral disturbance: Secondary | ICD-10-CM | POA: Diagnosis present

## 2016-04-15 DIAGNOSIS — Z7982 Long term (current) use of aspirin: Secondary | ICD-10-CM

## 2016-04-15 DIAGNOSIS — J449 Chronic obstructive pulmonary disease, unspecified: Secondary | ICD-10-CM | POA: Diagnosis not present

## 2016-04-15 DIAGNOSIS — Z7951 Long term (current) use of inhaled steroids: Secondary | ICD-10-CM | POA: Diagnosis not present

## 2016-04-15 DIAGNOSIS — I5043 Acute on chronic combined systolic (congestive) and diastolic (congestive) heart failure: Secondary | ICD-10-CM | POA: Diagnosis present

## 2016-04-15 DIAGNOSIS — E43 Unspecified severe protein-calorie malnutrition: Secondary | ICD-10-CM | POA: Diagnosis present

## 2016-04-15 DIAGNOSIS — R509 Fever, unspecified: Secondary | ICD-10-CM | POA: Diagnosis present

## 2016-04-15 HISTORY — DX: Pneumonia, unspecified organism: J18.9

## 2016-04-15 LAB — COMPREHENSIVE METABOLIC PANEL
ALT: 19 U/L (ref 17–63)
ANION GAP: 7 (ref 5–15)
AST: 22 U/L (ref 15–41)
Albumin: 3 g/dL — ABNORMAL LOW (ref 3.5–5.0)
Alkaline Phosphatase: 58 U/L (ref 38–126)
BUN: 20 mg/dL (ref 6–20)
CALCIUM: 8.4 mg/dL — AB (ref 8.9–10.3)
CHLORIDE: 110 mmol/L (ref 101–111)
CO2: 24 mmol/L (ref 22–32)
CREATININE: 0.87 mg/dL (ref 0.61–1.24)
GFR calc Af Amer: >60 mL/min (ref >60)
Glucose, Bld: 116 mg/dL — ABNORMAL HIGH (ref 65–99)
Potassium: 3.5 mmol/L (ref 3.5–5.1)
SODIUM: 141 mmol/L (ref 135–145)
Total Bilirubin: 0.6 mg/dL (ref 0.3–1.2)
Total Protein: 7.3 g/dL (ref 6.5–8.1)

## 2016-04-15 LAB — URINALYSIS, ROUTINE W REFLEX MICROSCOPIC
Bilirubin Urine: NEGATIVE
Glucose, UA: NEGATIVE mg/dL
Ketones, ur: NEGATIVE mg/dL
Leukocytes, UA: NEGATIVE
Nitrite: NEGATIVE
PROTEIN: 30 mg/dL — AB
SPECIFIC GRAVITY, URINE: 1.018 (ref 1.005–1.030)
SQUAMOUS EPITHELIAL / LPF: NONE SEEN
pH: 5 (ref 5.0–8.0)

## 2016-04-15 LAB — CBC WITH DIFFERENTIAL/PLATELET
BASOS PCT: 0 %
Basophils Absolute: 0 10*3/uL (ref 0.0–0.1)
EOS ABS: 0.1 10*3/uL (ref 0.0–0.7)
Eosinophils Relative: 1 %
HEMATOCRIT: 29.7 % — AB (ref 39.0–52.0)
HEMOGLOBIN: 9.4 g/dL — AB (ref 13.0–17.0)
LYMPHS ABS: 1.6 10*3/uL (ref 0.7–4.0)
Lymphocytes Relative: 12 %
MCH: 27.9 pg (ref 26.0–34.0)
MCHC: 31.6 g/dL (ref 30.0–36.0)
MCV: 88.1 fL (ref 78.0–100.0)
Monocytes Absolute: 1.3 10*3/uL — ABNORMAL HIGH (ref 0.1–1.0)
Monocytes Relative: 10 %
NEUTROS ABS: 10.9 10*3/uL — AB (ref 1.7–7.7)
NEUTROS PCT: 77 %
PLATELETS: 140 10*3/uL — AB (ref 150–400)
RBC: 3.37 MIL/uL — AB (ref 4.22–5.81)
RDW: 14.1 % (ref 11.5–15.5)
WBC: 14 10*3/uL — AB (ref 4.0–10.5)

## 2016-04-15 LAB — TROPONIN I
Troponin I: 0.03 ng/mL (ref <0.03)
Troponin I: <0.03 ng/mL (ref <0.03)
Troponin I: 0.07 ng/mL (ref <0.03)

## 2016-04-15 LAB — GLUCOSE, CAPILLARY: Glucose-Capillary: 98 mg/dL (ref 65–99)

## 2016-04-15 LAB — CBC
HEMATOCRIT: 28.9 % — AB (ref 39.0–52.0)
Hemoglobin: 9.2 g/dL — ABNORMAL LOW (ref 13.0–17.0)
MCH: 28 pg (ref 26.0–34.0)
MCHC: 31.8 g/dL (ref 30.0–36.0)
MCV: 88.1 fL (ref 78.0–100.0)
Platelets: 126 10*3/uL — ABNORMAL LOW (ref 150–400)
RBC: 3.28 MIL/uL — ABNORMAL LOW (ref 4.22–5.81)
RDW: 14.4 % (ref 11.5–15.5)
WBC: 10.7 10*3/uL — AB (ref 4.0–10.5)

## 2016-04-15 LAB — MAGNESIUM: MAGNESIUM: 2.1 mg/dL (ref 1.7–2.4)

## 2016-04-15 LAB — PHOSPHORUS: PHOSPHORUS: 2.7 mg/dL (ref 2.5–4.6)

## 2016-04-15 LAB — CREATININE, SERUM
Creatinine, Ser: 0.83 mg/dL (ref 0.61–1.24)
GFR calc non Af Amer: >60 mL/min (ref >60)

## 2016-04-15 LAB — LACTIC ACID, PLASMA: LACTIC ACID, VENOUS: 0.7 mmol/L (ref 0.5–1.9)

## 2016-04-15 LAB — I-STAT CG4 LACTIC ACID, ED: LACTIC ACID, VENOUS: 1.14 mmol/L (ref 0.5–1.9)

## 2016-04-15 MED ORDER — HALOPERIDOL 0.5 MG PO TABS
0.5000 mg | ORAL_TABLET | Freq: Two times a day (BID) | ORAL | Status: DC | PRN
  Filled 2016-04-15: qty 1

## 2016-04-15 MED ORDER — DEXTROSE 5 % IV SOLN
1.0000 g | INTRAVENOUS | Status: DC
  Administered 2016-04-16 – 2016-04-19 (×4): 1 g via INTRAVENOUS
  Filled 2016-04-15 (×4): qty 10

## 2016-04-15 MED ORDER — ALBUTEROL SULFATE (2.5 MG/3ML) 0.083% IN NEBU: 2.5000 mg | INHALATION_SOLUTION | RESPIRATORY_TRACT | Status: DC | PRN

## 2016-04-15 MED ORDER — ASPIRIN 81 MG PO TABS: 81.0000 mg | ORAL_TABLET | Freq: Every day | ORAL | Status: DC

## 2016-04-15 MED ORDER — ATORVASTATIN CALCIUM 40 MG PO TABS
40.0000 mg | ORAL_TABLET | Freq: Every day | ORAL | Status: DC
  Administered 2016-04-15 – 2016-04-18 (×4): 40 mg via ORAL
  Filled 2016-04-15 (×4): qty 1

## 2016-04-15 MED ORDER — ONDANSETRON HCL 4 MG/2ML IJ SOLN: 4.0000 mg | Freq: Four times a day (QID) | INTRAMUSCULAR | Status: DC | PRN

## 2016-04-15 MED ORDER — DARIFENACIN HYDROBROMIDE ER 7.5 MG PO TB24
7.5000 mg | ORAL_TABLET | Freq: Every day | ORAL | Status: DC
  Administered 2016-04-15 – 2016-04-19 (×5): 7.5 mg via ORAL
  Filled 2016-04-15 (×6): qty 1

## 2016-04-15 MED ORDER — DOCUSATE SODIUM 100 MG PO CAPS
100.0000 mg | ORAL_CAPSULE | Freq: Every day | ORAL | Status: DC
  Administered 2016-04-15 – 2016-04-18 (×4): 100 mg via ORAL
  Filled 2016-04-15 (×4): qty 1

## 2016-04-15 MED ORDER — ASPIRIN 81 MG PO CHEW
81.0000 mg | CHEWABLE_TABLET | Freq: Every day | ORAL | Status: DC
  Administered 2016-04-15 – 2016-04-19 (×5): 81 mg via ORAL
  Filled 2016-04-15 (×5): qty 1

## 2016-04-15 MED ORDER — TRAZODONE HCL 50 MG PO TABS: 50.0000 mg | ORAL_TABLET | Freq: Every evening | ORAL | Status: DC | PRN

## 2016-04-15 MED ORDER — PIPERACILLIN-TAZOBACTAM 3.375 G IVPB 30 MIN: 3.3750 g | Freq: Once | INTRAVENOUS | Status: DC

## 2016-04-15 MED ORDER — GUAIFENESIN ER 600 MG PO TB12
600.0000 mg | ORAL_TABLET | Freq: Two times a day (BID) | ORAL | Status: DC
  Administered 2016-04-15 – 2016-04-19 (×9): 600 mg via ORAL
  Filled 2016-04-15 (×9): qty 1

## 2016-04-15 MED ORDER — ONDANSETRON HCL 4 MG PO TABS: 4.0000 mg | ORAL_TABLET | Freq: Four times a day (QID) | ORAL | Status: DC | PRN

## 2016-04-15 MED ORDER — LOSARTAN POTASSIUM 25 MG PO TABS
25.0000 mg | ORAL_TABLET | Freq: Every day | ORAL | Status: DC
  Administered 2016-04-15: 25 mg via ORAL
  Filled 2016-04-15: qty 1

## 2016-04-15 MED ORDER — SODIUM CHLORIDE 0.9% FLUSH
3.0000 mL | Freq: Two times a day (BID) | INTRAVENOUS | Status: DC
  Administered 2016-04-15 – 2016-04-19 (×8): 3 mL via INTRAVENOUS

## 2016-04-15 MED ORDER — DEXTROSE 5 % IV SOLN
1.0000 g | Freq: Once | INTRAVENOUS | Status: AC
  Administered 2016-04-15: 1 g via INTRAVENOUS
  Filled 2016-04-15: qty 10

## 2016-04-15 MED ORDER — ENOXAPARIN SODIUM 40 MG/0.4ML ~~LOC~~ SOLN
40.0000 mg | SUBCUTANEOUS | Status: DC
  Administered 2016-04-15: 40 mg via SUBCUTANEOUS
  Filled 2016-04-15: qty 0.4

## 2016-04-15 MED ORDER — POLYETHYLENE GLYCOL 3350 17 G PO PACK
17.0000 g | PACK | Freq: Every day | ORAL | Status: DC
  Administered 2016-04-15 – 2016-04-19 (×5): 17 g via ORAL
  Filled 2016-04-15 (×5): qty 1

## 2016-04-15 MED ORDER — DEXTROSE 5 % IV SOLN
500.0000 mg | Freq: Once | INTRAVENOUS | Status: AC
  Administered 2016-04-15: 500 mg via INTRAVENOUS
  Filled 2016-04-15: qty 500

## 2016-04-15 MED ORDER — ACETAMINOPHEN 325 MG PO TABS
650.0000 mg | ORAL_TABLET | Freq: Once | ORAL | Status: AC
  Administered 2016-04-15: 650 mg via ORAL

## 2016-04-15 MED ORDER — DEXTROSE 5 % IV SOLN
250.0000 mg | INTRAVENOUS | Status: DC
  Administered 2016-04-16: 250 mg via INTRAVENOUS
  Filled 2016-04-15: qty 250

## 2016-04-15 MED ORDER — QUETIAPINE 12.5 MG HALF TABLET
12.5000 mg | ORAL_TABLET | Freq: Every day | ORAL | Status: DC
  Administered 2016-04-15: 25 mg via ORAL
  Administered 2016-04-16 – 2016-04-18 (×3): 12.5 mg via ORAL
  Filled 2016-04-15 (×5): qty 2

## 2016-04-15 MED ORDER — BUDESONIDE 0.25 MG/2ML IN SUSP
0.2500 mg | Freq: Two times a day (BID) | RESPIRATORY_TRACT | Status: DC
  Administered 2016-04-15 – 2016-04-19 (×8): 0.25 mg via RESPIRATORY_TRACT
  Filled 2016-04-15 (×9): qty 2

## 2016-04-15 MED ORDER — VANCOMYCIN HCL IN DEXTROSE 1-5 GM/200ML-% IV SOLN: 1000.0000 mg | Freq: Once | INTRAVENOUS | Status: DC

## 2016-04-15 MED ORDER — SODIUM CHLORIDE 0.9 % IV SOLN
INTRAVENOUS | Status: AC
  Administered 2016-04-15 (×2): via INTRAVENOUS

## 2016-04-15 MED ORDER — METOPROLOL TARTRATE 5 MG/5ML IV SOLN: 5.0000 mg | Freq: Two times a day (BID) | INTRAVENOUS | Status: DC | PRN

## 2016-04-15 MED ORDER — ACETAMINOPHEN 650 MG RE SUPP: 650.0000 mg | Freq: Four times a day (QID) | RECTAL | Status: DC | PRN

## 2016-04-15 MED ORDER — ACETAMINOPHEN 325 MG PO TABS
650.0000 mg | ORAL_TABLET | Freq: Four times a day (QID) | ORAL | Status: DC | PRN
  Administered 2016-04-17 – 2016-04-18 (×2): 650 mg via ORAL
  Filled 2016-04-15 (×3): qty 2

## 2016-04-15 MED ORDER — HYDRALAZINE HCL 20 MG/ML IJ SOLN: 10.0000 mg | Freq: Three times a day (TID) | INTRAMUSCULAR | Status: DC | PRN

## 2016-04-15 MED ORDER — DONEPEZIL HCL 10 MG PO TABS
10.0000 mg | ORAL_TABLET | Freq: Every day | ORAL | Status: DC
  Administered 2016-04-15 – 2016-04-18 (×4): 10 mg via ORAL
  Filled 2016-04-15 (×4): qty 1

## 2016-04-15 MED ORDER — IPRATROPIUM-ALBUTEROL 0.5-2.5 (3) MG/3ML IN SOLN
3.0000 mL | Freq: Four times a day (QID) | RESPIRATORY_TRACT | Status: DC
  Administered 2016-04-15 – 2016-04-16 (×6): 3 mL via RESPIRATORY_TRACT
  Filled 2016-04-15 (×6): qty 3

## 2016-04-15 NOTE — ED Triage Notes (Signed)
Pt from home with daughter for fever onset last night, states recent uti and flu actively being treated. Daughter noted he was warm to the touch, noted fever 101. States she gave him some robitussin at [facility] with no real change. Pt alert x3 at this time.

## 2016-04-15 NOTE — ED Provider Notes (Signed)
[facility] DEPT Provider Note   CSN: [id number] Arrival date & time: 04/15/16  4081     History   Chief Complaint Chief Complaint  Patient presents with  . Fever    HPI Aaron Johnson is a 83 y.o. male.  HPI  83 y.o. male with a hx of CHF, COPD, HTN, Stroke, Dementia, presents to the Emergency Department today due to fever. Daughter noticed fever this AM. Tmax 102F. Notes productive cough x2 weeks that she has been treating with Robitussin. Notes no N/V/D. Pt daughter states that he has been sleeping more regularly with increased agitation from baseline. Pt uses depends due to dementia with associated urinary incontinence. Daughter has not seen patient complaining of pain. Tolerating POs.   Level V Caveat; Dementia    Past Medical History:  Diagnosis Date  . Acquired musculoskeletal deformity of unspecified site   . Agitation    sun downing  . 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 Aortic valve insufficiency   . Benign neoplasm of colon   . 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 CHF (congestive heart failure) ([place])   . Chronic diastolic heart failure (HCC)    EF >= 50%  . COPD (chronic obstructive pulmonary disease) ([place])   . Hemiparesis, left ([place])   . HTN (hypertension), benign    systematic  . Iron deficiency anemia, unspecified   . LFTs abnormal 3/06   LFTs wnl, Cr - 1.3 stable. 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 colonoscopy 3/05- adenomatous polyps, mild gastritis-diffuse interosseous skeletal hypertosis (DISH), h/o H. pylori s/p tx.  . Pure hypercholesterolemia   . Stroke [facility])     Patient Active Problem List   Diagnosis Date Noted  . Blind 12/25/2013  . Debilitated patient 12/25/2013  . Convulsion ([place]) 12/07/2013  . Paroxysmal atrial fibrillation ([place]) 12/07/2013  . Intracranial hemorrhage ([place])  11/24/2013  . Fall 11/24/2013  . Dementia 11/24/2013  . Cardiomyopathy, dilated ([place]) 10/11/2013  . TIA (transient ischemic attack) 08/02/2013  . Fever 02/06/2013  . PNA (pneumonia) 02/06/2013  . Left hemiparesis ([place]) 10/13/2012  . Hemiparesis, left ([place])   . Chest pain 06/23/2011  . PERSONAL HISTORY OF COLONIC POLYPS 07/11/2009  . HYPERLIPIDEMIA 06/06/2008  . S/P AVR 04/03/2008  . COLON POLYP 03/03/2006  . HYPERCHOLESTEROLEMIA 03/03/2006  . ANEMIA, IRON DEFICIENCY, UNSPEC. 03/03/2006  . HYPERTENSION, BENIGN SYSTEMIC 03/03/2006  . CORONARY, ARTERIOSCLEROSIS 03/03/2006  . Moderate aortic regurgitation 03/03/2006  . CHF, EJECTION FRACTION > OR = 50% 03/03/2006  . COPD 03/03/2006  . CEREBROVAS DIS, LATE EFFECTS (S/P CVA) 03/03/2006  . ANGIOEDEMA 03/03/2006    Past Surgical History:  Procedure Laterality Date  . AORTIC VALVE REPLACEMENT     with pericardial tissue valve  . CARDIAC CATHETERIZATION  1/99   mild CAD; EF 35-40%  . CARDIAC VALVE REPLACEMENT    . MULTIPLE TOOTH EXTRACTIONS         Home Medications    Prior to Admission medications   Medication Sig Start Date End Date Taking? Authorizing Provider  amoxicillin-clavulanate (AUGMENTIN) 875-125 MG tablet Take 1 tablet by mouth 2 (two) times daily. Started 11/13/14, for 7 days ending 11/19/14    Historical Provider, MD  aspirin 81 MG tablet Take 1 tablet (81 mg total) by mouth daily. Resume after 7 days from 11/26/13 11/26/13  Shanker Kristeen Mans, MD  atorvastatin (LIPITOR) 40 MG tablet Take 40 mg by mouth at bedtime.     Historical Provider, MD  brimonidine (ALPHAGAN P) 0.1 % SOLN Place 1 drop into the right eye 2 (two) times daily.     Historical Provider, MD  docusate sodium (COLACE) 100 MG capsule Take 100 mg by mouth daily.    Historical Provider, MD  donepezil (ARICEPT) 10 MG tablet Take 1 tablet by mouth at bedtime. 08/24/14   Historical Provider, MD  dorzolamide-timolol (COSOPT) 22.3-6.8 MG/ML ophthalmic  solution Place 1 drop into the right eye 2 (two) times daily.  06/16/14   Historical Provider, MD  Fluticasone-Salmeterol (ADVAIR DISKUS) 250-50 MCG/DOSE AEPB Inhale 1 puff into the lungs 2 (two) times daily.     Historical Provider, MD  furosemide (LASIX) 20 MG tablet Take 20 mg by mouth daily as needed for fluid or edema. For edema    Historical Provider, MD  haloperidol (HALDOL) 0.5 MG tablet Take 1 tablet (0.5 mg total) by mouth 2 (two) times daily. Patient taking differently: Take 0.5 mg by mouth 2 (two) times daily as needed.  12/25/13   Erlene Quan, PA-C  losartan (COZAAR) 25 MG tablet Take 25 mg by mouth daily.    Historical Provider, MD  Multiple Vitamin (MULTIVITAMIN) tablet Take 1 tablet by mouth daily.      Historical Provider, MD  oxyCODONE-acetaminophen (PERCOCET) 5-325 MG per tablet Take 0.5-1 tablets by mouth every 4 (four) hours as needed for moderate pain.     Historical Provider, MD  polyethylene glycol (MIRALAX / GLYCOLAX) packet Take 17 g by mouth daily.    Historical Provider, MD  potassium chloride (KLOR-CON) 20 MEQ packet Take 20 mEq by mouth daily as needed (for edema when taking lasix).     Historical Provider, MD  QUEtiapine (SEROQUEL) 25 MG tablet Take 12.5-25 mg by mouth at bedtime. Depending on severity of sundowning    Historical Provider, MD  solifenacin (VESICARE) 5 MG tablet Take 5 mg by mouth daily.    Historical Provider, MD  Travoprost, BAK Free, (TRAVATAN) 0.004 % SOLN ophthalmic solution Place 1 drop into the right eye at bedtime.     Historical Provider, MD  traZODone (DESYREL) 100 MG tablet Take 50-100 mg by mouth at bedtime as needed for sleep.  01/24/14   Historical Provider, MD    Family History Family History  Problem Relation Age of Onset  . Colon cancer Neg Hx   . Heart disease Father     Social History Social History  Substance Use Topics  . Smoking status: Former Smoker    Types: Cigarettes  . Smokeless tobacco: Never Used     Comment:  10/13/2012 "smoked a little bit a long time ago"  . Alcohol use No     Comment: 10/13/2012 "haven't had anything to drink for years"     Allergies   Patient has no known allergies.   Review of Systems Review of Systems  Unable to perform ROS: Dementia   Physical Exam Updated Vital Signs BP 130/70 (BP Location: Right Arm)   Pulse 73   Temp (!) 101 F (38.3 C) (Oral)   Resp (!) 22   SpO2 100%   Physical Exam  Constitutional: He appears well-developed and well-nourished. No distress.  HENT:  Head: Normocephalic and atraumatic.  Right Ear: Tympanic membrane, external ear and ear canal normal.  Left Ear: Tympanic membrane, external ear and ear canal normal.  Nose: Nose normal.  Mouth/Throat: Uvula is midline, oropharynx is clear and moist and mucous membranes are normal. No trismus in the jaw. No oropharyngeal exudate, posterior oropharyngeal erythema or tonsillar abscesses.  Eyes: EOM are normal. Pupils are equal, round, and reactive to light.  Neck: Normal range of motion. Neck supple. No tracheal deviation present.  Cardiovascular: Normal rate, regular rhythm, S1 normal, S2 normal, normal heart sounds, intact distal pulses and normal pulses.   Pulmonary/Chest: Effort normal and breath sounds normal. No respiratory distress. He has no decreased breath sounds. He has no wheezes. He has no rhonchi. He has no rales.  Abdominal: Normal appearance and bowel sounds are normal. There is no tenderness.  Musculoskeletal: Normal range of motion.  Neurological: He is alert. Disoriented: dementia.  Skin: Skin is warm and dry.  Psychiatric: He has a normal mood and affect. His speech is normal and behavior is normal. Thought content normal.   ED Treatments / Results  Labs (all labs ordered are listed, but only abnormal results are displayed) Labs Reviewed  CBC WITH DIFFERENTIAL/PLATELET - Abnormal; Notable for the following:       Result Value   WBC 14.0 (*)    RBC 3.37 (*)     Hemoglobin 9.4 (*)    HCT 29.7 (*)    Platelets 140 (*)    Neutro Abs 10.9 (*)    Monocytes Absolute 1.3 (*)    All other components within normal limits  CULTURE, BLOOD (ROUTINE X 2)  CULTURE, BLOOD (ROUTINE X 2)  URINE CULTURE  COMPREHENSIVE METABOLIC PANEL  URINALYSIS, ROUTINE W REFLEX MICROSCOPIC  I-STAT CG4 LACTIC ACID, ED   EKG  EKG Interpretation  Date/Time:  Thursday April 15 2016 05:54:34 EDT Ventricular Rate:  84 PR Interval:    QRS Duration: 111 QT Interval:  410 QTC Calculation: 458 R Axis:   55 Text Interpretation:  Atrial fibrillation Paired ventricular premature complexes LVH with IVCD and secondary repol abnrm No significant change since last tracing Confirmed by WARD,  DO, KRISTEN [id number]) on 04/15/2016 6:05:48 AM       Radiology Dg Chest 2 View  Result Date: 04/15/2016 CLINICAL DATA:  Acute onset of cough, fever, generalized weakness and sepsis. Initial encounter. EXAM: CHEST  2 VIEW COMPARISON:  Chest radiograph performed 03/22/2016 FINDINGS: The lungs are well-aerated. A small left pleural effusion is noted. Left basilar and mid lung airspace opacification is concerning for pneumonia. There is no evidence of pneumothorax. The known calcified granuloma at the right upper lobe is not well characterized on this study. The heart is borderline normal in size. The patient is status post median sternotomy. An aortic valve replacement is noted. No acute osseous abnormalities are seen. IMPRESSION: Small left pleural effusion noted.  Left-sided pneumonia noted. Electronically Signed   By: Garald Balding M.D.   On: 04/15/2016 06:54    Procedures Procedures (including critical care time)  Medications Ordered in ED Medications  cefTRIAXone (ROCEPHIN) 1 g in dextrose 5 % 50 mL IVPB (not administered)  azithromycin (ZITHROMAX) 500 mg in dextrose 5 % 250 mL IVPB (not administered)   Initial Impression / Assessment and Plan / ED Course  I have reviewed the triage vital  signs and the nursing notes.  Pertinent labs & imaging results that were available during my care of the patient were reviewed by me and considered in my medical decision making (see chart for details).  Final Clinical Impressions(s) / ED Diagnoses  {I have reviewed and evaluated the relevant laboratory values. {I  have reviewed and evaluated the relevant imaging studies. {I have interpreted the relevant EKG. {I have reviewed the relevant previous healthcare records.  {I obtained HPI from historian. {Patient discussed with supervising physician.  ED Course:  Assessment: Pt is a 83 y.o. male with hx CHF, COPD, HTN, Stroke, Dementia who presents with fever with Tmax 102F this morning. Noted cough x 2 weeks. No N/V/D. Increased agitation and weakness per daughter that is different from baseline. On exam, pt in Nontoxic/nonseptic appearing. VSS. No hypotension. No tachycardia. Temp 102F. Lungs CTA. Heart RRR. Abdomen nontender soft. iStat Lactate 1.14. WBC 14. Pending UA. Blood Cultures drawn. Given Rocephin/Azithro abx to cover CAP. CXR shows left sided pneumonia. Plan is to Admit to medicine.   Disposition/Plan:  Admit Pt acknowledges and agrees with plan  Supervising Physician Viera [place], DO  Final diagnoses:  Community acquired pneumonia of left lower lobe of lung [facility])    New Prescriptions New Prescriptions   No medications on file     Shary Decamp, PA-C 04/15/16 3151

## 2016-04-15 NOTE — ED Notes (Signed)
Patient transported to X-ray 

## 2016-04-15 NOTE — ED Provider Notes (Signed)
Medical screening examination/treatment/procedure(s) were conducted as a shared visit with non-physician practitioner(s) and myself.  I personally evaluated the patient during the encounter.   EKG Interpretation  Date/Time:  Thursday April 15 2016 05:54:34 EDT Ventricular Rate:  84 PR Interval:    QRS Duration: 111 QT Interval:  410 QTC Calculation: 458 R Axis:   55 Text Interpretation:  Atrial fibrillation Paired ventricular premature complexes LVH with IVCD and secondary repol abnrm No significant change since last tracing Confirmed by Shaunita Seney,  DO, Hailey Miles [phone]) on 04/15/2016 6:05:48 AM      Patient is an 83 year old male with history of dementia who presents emergency department with fevers, cough, increased agitation. Lives at home with daughter. Febrile here but otherwise hemodynamically stable. Lactate is normal.  Patient has leukocytosis. Broad-spectrum abx given.Patient has left-sided pneumonia. Will admit.   [place], DO 04/15/16 [phone]

## 2016-04-15 NOTE — Progress Notes (Signed)
Dr. Aggie Moats returned call and instructed to call admission pager for new admission.   Number called.  Karie Kirks, Therapist, sports.

## 2016-04-15 NOTE — H&P (Signed)
[facility] History and Physical  Aaron Johnson PNT:[id number] DOB: October 08, 1926 DOA: 04/15/2016  Referring physician:  PCP: Kandice Hams, MD   Chief Complaint: "I noticed he had a fever."-Daughter  HPI: Aaron Johnson is a 83 y.o. male with pmhx of agitation, artifical pig heart valve, blind, COPD, HTN, stroke, anemia who came to ED due to fever. Per daughter gives most of the history due to patient's dementia. Patient has had a productive cough for 2 weeks. She tried Robitussin and a single dose ibuprofen for symptoms. This was mildly affected. Patient feels patient is slightly more agitated than normal & has been sleeping a lot more. His oral intake is down but he has received adequate fluids. Daughter states patient has not complained of headaches, chills, nausea, vomiting and has not had diarrhea.  ED course: X-ray showed left-sided pneumonia and patient was given azithromycin and Rocephin. Hospitalist consulted for admission.   Review of Systems:  As per HPI otherwise 10 point review of systems negative.    Past Medical History:  Diagnosis Date  . Acquired musculoskeletal deformity of unspecified site   . Agitation    sun downing  . 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 Aortic valve insufficiency   . Benign neoplasm of colon   . 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 CHF (congestive heart failure) ([place])   . Chronic diastolic heart failure (HCC)    EF >= 50%  . COPD (chronic obstructive pulmonary disease) ([place])   . Hemiparesis, left ([place])   . HTN (hypertension), benign    systematic  . Iron deficiency anemia, unspecified   . LFTs abnormal 3/06   LFTs wnl, Cr - 1.3 stable. 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 colonoscopy 3/05- adenomatous polyps, mild gastritis-diffuse interosseous skeletal hypertosis (DISH), h/o H.  pylori s/p tx.  . Pure hypercholesterolemia   . Stroke [facility])    Past Surgical History:  Procedure Laterality Date  . AORTIC VALVE REPLACEMENT     with pericardial tissue valve  . CARDIAC CATHETERIZATION  1/99   mild CAD; EF 35-40%  . CARDIAC VALVE REPLACEMENT    . MULTIPLE TOOTH EXTRACTIONS     Social History:  reports that he has quit smoking. His smoking use included Cigarettes. He has never used smokeless tobacco. He reports that he does not drink alcohol or use drugs.  No Known Allergies  Family History  Problem Relation Age of Onset  . Colon cancer Neg Hx   . Heart disease Father      Prior to Admission medications   Medication Sig Start Date End Date Taking? Authorizing Provider  amoxicillin-clavulanate (AUGMENTIN) 875-125 MG tablet Take 1 tablet by mouth 2 (two) times daily.    Yes Historical Provider, MD  aspirin 81 MG tablet Take 1 tablet (81 mg total) by mouth daily. Resume after 7 days from 11/26/13 11/26/13  Yes Shanker Kristeen Mans, MD  atorvastatin (LIPITOR) 40 MG tablet Take 40 mg by mouth at bedtime.    Yes Historical Provider, MD  docusate sodium (COLACE) 100 MG capsule Take 100 mg by mouth daily.   Yes Historical Provider, MD  donepezil (ARICEPT) 10 MG tablet Take 1 tablet by mouth at bedtime. 08/24/14  Yes Historical Provider, MD  Fluticasone-Salmeterol (ADVAIR DISKUS) 250-50 MCG/DOSE AEPB Inhale 1 puff into the lungs 2 (  two) times daily as needed (SOB).    Yes Historical Provider, MD  furosemide (LASIX) 20 MG tablet Take 20 mg by mouth daily as needed for fluid or edema. For edema   Yes Historical Provider, MD  haloperidol (HALDOL) 0.5 MG tablet Take 1 tablet (0.5 mg total) by mouth 2 (two) times daily. Patient taking differently: Take 0.5 mg by mouth 2 (two) times daily as needed for agitation.  12/25/13  Yes Luke K Kilroy, PA-C  losartan (COZAAR) 25 MG tablet Take 25 mg by mouth daily.   Yes Historical Provider, MD  Multiple Vitamin (MULTIVITAMIN) tablet Take 1  tablet by mouth daily.     Yes Historical Provider, MD  oxyCODONE-acetaminophen (PERCOCET) 5-325 MG per tablet Take 0.5-1 tablets by mouth every 4 (four) hours as needed for moderate pain.    Yes Historical Provider, MD  polyethylene glycol (MIRALAX / GLYCOLAX) packet Take 17 g by mouth daily.   Yes Historical Provider, MD  potassium chloride (KLOR-CON) 20 MEQ packet Take 20 mEq by mouth daily as needed (for edema when taking lasix).    Yes Historical Provider, MD  QUEtiapine (SEROQUEL) 25 MG tablet Take 12.5-25 mg by mouth at bedtime. Depending on severity of sundowning   Yes Historical Provider, MD  solifenacin (VESICARE) 5 MG tablet Take 5 mg by mouth daily.   Yes Historical Provider, MD  traZODone (DESYREL) 100 MG tablet Take 50-100 mg by mouth at bedtime as needed for sleep.  01/24/14  Yes Historical Provider, MD   Physical Exam: Vitals:   04/15/16 0604 04/15/16 0615 04/15/16 0717  BP: 130/70 120/67 135/71  Pulse: 73 68 82  Resp: (!) 22 (!) 22 (!) 24  Temp: (!) 101 F (38.3 C)    TempSrc: Oral    SpO2: 100% 94% 93%    Wt Readings from Last 3 Encounters:  04/16/14 59 kg (129 lb 15.7 oz)  11/24/13 63.2 kg (139 lb 6.4 oz)  10/11/13 62 kg (136 lb 9.6 oz)    General:  Appears calm and comfortable, sleepy, cachectic Eyes:  Pupils are round but nonreactive to light, EOMI grossly normal but unable to fully assess, normal lids, iris ENT:  grossly normal hearing, lips & tongue, and edentulous Neck:  no LAD, masses or thyromegaly Cardiovascular:  RRR, no m/r/g. No LE edema.  Respiratory:  CTA bilaterally, no w/r/r. Normal respiratory effort. Abdomen:  soft, ntnd Skin:  no rash or induration seen on limited exam Musculoskeletal:  grossly normal tone BUE/BLE Psychiatric:  grossly normal mood and affect, speech fluent and appropriate Neurologic:  CN 7-12 grossly intact, moves all extremities          Labs on Admission:  Basic Metabolic Panel:  Recent Labs Lab 04/15/16 0637  NA 141   K 3.5  CL 110  CO2 24  GLUCOSE 116*  BUN 20  CREATININE 0.87  CALCIUM 8.4*   Liver Function Tests:  Recent Labs Lab 04/15/16 0637  AST 22  ALT 19  ALKPHOS 58  BILITOT 0.6  PROT 7.3  ALBUMIN 3.0*   No results for input(s): LIPASE, AMYLASE in the last 168 hours. No results for input(s): AMMONIA in the last 168 hours. CBC:  Recent Labs Lab 04/15/16 0637  WBC 14.0*  NEUTROABS 10.9*  HGB 9.4*  HCT 29.7*  MCV 88.1  PLT 140*   Cardiac Enzymes: No results for input(s): CKTOTAL, CKMB, CKMBINDEX, TROPONINI in the last 168 hours.  BNP (last 3 results) No results for input(s): BNP in  the last 8760 hours.  ProBNP (last 3 results) No results for input(s): PROBNP in the last 8760 hours.   Creatinine clearance cannot be calculated (Unknown ideal weight.)  CBG: No results for input(s): GLUCAP in the last 168 hours.  Radiological Exams on Admission: Dg Chest 2 View  Result Date: 04/15/2016 CLINICAL DATA:  Acute onset of cough, fever, generalized weakness and sepsis. Initial encounter. EXAM: CHEST  2 VIEW COMPARISON:  Chest radiograph performed 03/22/2016 FINDINGS: The lungs are well-aerated. A small left pleural effusion is noted. Left basilar and mid lung airspace opacification is concerning for pneumonia. There is no evidence of pneumothorax. The known calcified granuloma at the right upper lobe is not well characterized on this study. The heart is borderline normal in size. The patient is status post median sternotomy. An aortic valve replacement is noted. No acute osseous abnormalities are seen. IMPRESSION: Small left pleural effusion noted.  Left-sided pneumonia noted. Electronically Signed   By: Garald Balding M.D.   On: 04/15/2016 06:54    EKG: Independently reviewed. Afib with PVCs.  Assessment/Plan Principal Problem:   PNA (pneumonia) Active Problems:   HLD (hyperlipidemia)   HYPERTENSION, BENIGN SYSTEMIC   COPD (chronic obstructive pulmonary disease) (HCC)    Cardiomyopathy, dilated (HCC)   Dementia   Paroxysmal atrial fibrillation (HCC)   SIRS (systemic inflammatory response syndrome) (HCC)  Pna/SIRS Failed OP tx on augmentin Scheduled DuoNeb's When necessary albuterol Antibiotic- rocephin and azithro Oxygen therapy Continuous pulse oximetry UA pending Blood cult x2 pending LA nl Mucinex bid Repeat CXR in AM  Dementia Cont Seroquel, Aricept, trazodone Prn for agitation  Hypertension When necessary hydralazine 10 mg IV as needed for severe blood pressure Cont cozaar  COPD Cont budesonide bid Hold advair  Hyperlipidemia Continue statin  Paroxsymal Afib CHADVASC 6 Prn lopressor for Hr >130  Bladder issue Cont Vesicare-> enablex  Constipation Cont colace and miralax  LE edema hx Holding prn lasix  Code Status: Full  DVT Prophylaxis: lovenox Family Communication: dgtr at bedside Disposition Plan: Pending Improvement  Status: obs tele  Elwin Mocha, MD Family Medicine [facility] [email or web address] Password [email or web address]

## 2016-04-15 NOTE — Progress Notes (Signed)
Dr.  Aggie Moats notified of pt's arrival to floor.  Karie Kirks, Therapist, sports.

## 2016-04-15 NOTE — Progress Notes (Signed)
Admission nurse came earlier to do admission unable to get answer from pt's son.  He stated that to wait for his sister to come as she will be able to provide the answers.  Pt's sister at the bedside.  Attempted calling admission nurse x2  no response. Karie Kirks, Therapist, sports.

## 2016-04-16 ENCOUNTER — Inpatient Hospital Stay (HOSPITAL_COMMUNITY): Payer: Medicare Other

## 2016-04-16 ENCOUNTER — Encounter (HOSPITAL_COMMUNITY): Payer: Self-pay | Admitting: General Practice

## 2016-04-16 DIAGNOSIS — E43 Unspecified severe protein-calorie malnutrition: Secondary | ICD-10-CM | POA: Insufficient documentation

## 2016-04-16 LAB — CBC
HCT: 23.8 % — ABNORMAL LOW (ref 39.0–52.0)
HEMATOCRIT: 27.6 % — AB (ref 39.0–52.0)
HEMOGLOBIN: 7.8 g/dL — AB (ref 13.0–17.0)
Hemoglobin: 8.5 g/dL — ABNORMAL LOW (ref 13.0–17.0)
MCH: 28.6 pg (ref 26.0–34.0)
MCH: 27.2 pg (ref 26.0–34.0)
MCHC: 32.8 g/dL (ref 30.0–36.0)
MCHC: 30.8 g/dL (ref 30.0–36.0)
MCV: 87.2 fL (ref 78.0–100.0)
MCV: 88.2 fL (ref 78.0–100.0)
PLATELETS: 105 10*3/uL — AB (ref 150–400)
PLATELETS: 131 10*3/uL — AB (ref 150–400)
RBC: 2.73 MIL/uL — AB (ref 4.22–5.81)
RBC: 3.13 MIL/uL — AB (ref 4.22–5.81)
RDW: 14.3 % (ref 11.5–15.5)
RDW: 14.1 % (ref 11.5–15.5)
WBC: 9.9 10*3/uL (ref 4.0–10.5)
WBC: 10 10*3/uL (ref 4.0–10.5)

## 2016-04-16 LAB — BASIC METABOLIC PANEL
ANION GAP: 5 (ref 5–15)
BUN: 16 mg/dL (ref 6–20)
CALCIUM: 7.7 mg/dL — AB (ref 8.9–10.3)
CO2: 25 mmol/L (ref 22–32)
CREATININE: 0.78 mg/dL (ref 0.61–1.24)
Chloride: 109 mmol/L (ref 101–111)
Glucose, Bld: 100 mg/dL — ABNORMAL HIGH (ref 65–99)
Potassium: 3.3 mmol/L — ABNORMAL LOW (ref 3.5–5.1)
SODIUM: 139 mmol/L (ref 135–145)

## 2016-04-16 LAB — GLUCOSE, CAPILLARY
GLUCOSE-CAPILLARY: 142 mg/dL — AB (ref 65–99)
Glucose-Capillary: 75 mg/dL (ref 65–99)
Glucose-Capillary: 129 mg/dL — ABNORMAL HIGH (ref 65–99)
Glucose-Capillary: 115 mg/dL — ABNORMAL HIGH (ref 65–99)

## 2016-04-16 LAB — RETICULOCYTES
RBC.: 3.13 MIL/uL — ABNORMAL LOW (ref 4.22–5.81)
RETIC COUNT ABSOLUTE: 37.6 10*3/uL (ref 19.0–186.0)
RETIC CT PCT: 1.2 % (ref 0.4–3.1)

## 2016-04-16 LAB — URINE CULTURE: CULTURE: NO GROWTH

## 2016-04-16 LAB — TYPE AND SCREEN
ABO/RH(D): AB POS
ANTIBODY SCREEN: NEGATIVE

## 2016-04-16 LAB — ABO/RH: ABO/RH(D): AB POS

## 2016-04-16 LAB — PROTIME-INR
INR: 1.29
PROTHROMBIN TIME: 16.1 s — AB (ref 11.4–15.2)

## 2016-04-16 MED ORDER — POTASSIUM CHLORIDE CRYS ER 20 MEQ PO TBCR
40.0000 meq | EXTENDED_RELEASE_TABLET | Freq: Once | ORAL | Status: AC
  Administered 2016-04-16: 40 meq via ORAL
  Filled 2016-04-16: qty 2

## 2016-04-16 MED ORDER — AZITHROMYCIN 250 MG PO TABS
250.0000 mg | ORAL_TABLET | Freq: Every day | ORAL | Status: AC
  Administered 2016-04-17 – 2016-04-19 (×3): 250 mg via ORAL
  Filled 2016-04-16 (×4): qty 1

## 2016-04-16 MED ORDER — IPRATROPIUM-ALBUTEROL 0.5-2.5 (3) MG/3ML IN SOLN
3.0000 mL | Freq: Three times a day (TID) | RESPIRATORY_TRACT | Status: DC
  Administered 2016-04-17: 3 mL via RESPIRATORY_TRACT
  Filled 2016-04-16: qty 3

## 2016-04-16 NOTE — Progress Notes (Signed)
[facility] Progress Note  Patient: Aaron Johnson   PCP: Kandice Hams, MD DOB: 24-Sep-1926   DOA: 04/15/2016   DOS: 04/16/2016   Date of Service: the patient was seen and examined on 04/16/2016  Subjective: Continues to have shortness of breath as well as cough. No more fever. Blood pressure stabilized as well.  Brief hospital course: Pt. with PMH of dementia, bioprosthetic AVR, CVA, permanent blindness, adenopathy since anemia, COPD, chronic diastolic CHF; admitted on 04/15/2016, with complaint of fever and shortness of breath, was found to have community-acquired pneumonia. Also mild acute on chronic diastolic CHF. Currently further plan is continue close monitoring.  Assessment and Plan: 1. Community-acquired pneumonia Presented with fever and leukocytosis, meeting sepsis criteria. Was on Augmentin as an outpatient despite which continues to have fever. Started on IV ceftriaxone and azithromycin, currently on oral azithromycin and IV ceftriaxone, blood culture negative for 24 hours. Urine culture negative. Clinically improving. We will continue to monitor blood cultures a 48 hour clearance. Patient high-risk for bacteremia given sepsis criteria as well as history of aortic valve replacement. Bilateral expiratory wheezing still present with bronchospasm, continue duo nebs at present.  2. Acute on chronic combined systolic and diastolic CHF Paroxysmal A. fib. Mild elevation of serum troponin. Patient received IV fluids given sepsis physiology on presentation. Currently has mild volume overload although no respiratory distress. I would stop IV fluids, resume Lasix starting tomorrow if clinically stable. Serum troponin elevation is likely demand ischemia in the setting of sepsis as well as acute CHF Continue aspirin 81 mg, holding losartan 25 mg given borderline hypotension and infection. We'll monitor closely for need for aggressive diuresis otherwise we'll resume oral  Lasix starting tomorrow. History of A. fib although not on any anticoagulation, not on any rate control medication as well. Currently rate controlled A. fib.  3. Severe protein calorie malnutrition. Appreciate nutritional services consultation. We will monitor magnesium phosphorous as the patient is at high risk for refeeding syndrome.  4. Dementia, with behavioral issues. Currently no concerns no agitation. Continue home regimen at present.  5. History of iron deficiency anemia. Acute dilutional drop in hemoglobin. Baseline hemoglobin in the range of 10. On presentation and will go benign 0.4, currently 8.5. Normal reticulocyte count, normal INR. We'll monitor serial H&H at present. We will use SCD for DVT prophylaxis prevention. Check FOBT. Resume iron supplementation.  Bowel regimen: last BM 04/16/2016 Diet: Cardiac diet soft DVT Prophylaxis: mechanical compression device.  Advance goals of care discussion: full code  Family Communication: family was present at bedside, at the time of interview. The pt provided permission to discuss medical plan with the family. Opportunity was given to ask question and all questions were answered satisfactorily.   Disposition:  Discharge to home. Expected discharge date: 04/17/2016  Consultants: none Procedures: none  Antibiotics: Anti-infectives    Start     Dose/Rate Route Frequency Ordered Stop   04/17/16 1000  azithromycin (ZITHROMAX) tablet 250 mg     250 mg Oral Daily 04/16/16 0918 04/20/16 0959   04/16/16 0800  azithromycin (ZITHROMAX) 250 mg in dextrose 5 % 125 mL IVPB  Status:  Discontinued     250 mg 125 mL/hr over 60 Minutes Intravenous Every 24 hours 04/15/16 0804 04/16/16 0917   04/16/16 0800  cefTRIAXone (ROCEPHIN) 1 g in dextrose 5 % 50 mL IVPB     1 g 100 mL/hr over 30 Minutes Intravenous Every 24 hours 04/15/16 0811     04/15/16 0715  cefTRIAXone (ROCEPHIN) 1 g in dextrose 5 % 50 mL IVPB     1 g 100 mL/hr over 30  Minutes Intravenous  Once 04/15/16 0701 04/15/16 0814   04/15/16 0715  azithromycin (ZITHROMAX) 500 mg in dextrose 5 % 250 mL IVPB     500 mg 250 mL/hr over 60 Minutes Intravenous  Once 04/15/16 0701 04/15/16 0916   04/15/16 0700  piperacillin-tazobactam (ZOSYN) IVPB 3.375 g  Status:  Discontinued     3.375 g 100 mL/hr over 30 Minutes Intravenous  Once 04/15/16 0657 04/15/16 0700   04/15/16 0700  vancomycin (VANCOCIN) IVPB 1000 mg/200 mL premix  Status:  Discontinued     1,000 mg 200 mL/hr over 60 Minutes Intravenous  Once 04/15/16 0657 04/15/16 0700       Objective: Physical Exam: Vitals:   04/16/16 0512 04/16/16 0653 04/16/16 0849 04/16/16 1300  BP: (!) 98/52  (!) 112/55 105/67  Pulse: 70  70 79  Resp: 16  16 18   Temp: 98.4 F (36.9 C)  97.4 F (36.3 C) 98.5 F (36.9 C)  TempSrc: Axillary  Oral Oral  SpO2: 99%  98% 98%  Weight:  51.8 kg (114 lb 3.2 oz)    Height:        Intake/Output Summary (Last 24 hours) at 04/16/16 1541 Last data filed at 04/16/16 1500  Gross per 24 hour  Intake          1196.67 ml  Output                0 ml  Net          1196.67 ml   Filed Weights   04/15/16 1700 04/16/16 0653  Weight: 48.3 kg (106 lb 8 oz) 51.8 kg (114 lb 3.2 oz)   General: Alert, Awake and Oriented to person. Appear in moderate distress, affect appropriate ENT: Oral Mucosa clear moist Neck: difficult to assess JVD, no Abnormal Mass Or lumps Cardiovascular: S1 and S2 Present, aortic systolic Murmur, Respiratory: Bilateral Air entry equal and Decreased, no use of accessory muscle, bilateral Crackles, no wheezes Abdomen: Bowel Sound present, Soft and no tenderness Skin: no redness, no Rash, no induration Extremities: no Pedal edema, no calf tenderness Neurologic: Grossly no focal neuro deficit. Bilaterally Equal motor strength  Data Reviewed: CBC:  Recent Labs Lab 04/15/16 0637 04/15/16 1214 04/16/16 0329 04/16/16 0758  WBC 14.0* 10.7* 9.9 10.0  NEUTROABS 10.9*  --    --   --   HGB 9.4* 9.2* 7.8* 8.5*  HCT 29.7* 28.9* 23.8* 27.6*  MCV 88.1 88.1 87.2 88.2  PLT 140* 126* 105* 053*   Basic Metabolic Panel:  Recent Labs Lab 04/15/16 0637 04/15/16 0845 04/16/16 0329  NA 141  --  139  K 3.5  --  3.3*  CL 110  --  109  CO2 24  --  25  GLUCOSE 116*  --  100*  BUN 20  --  16  CREATININE 0.87 0.83 0.78  CALCIUM 8.4*  --  7.7*  MG  --  2.1  --   PHOS  --  2.7  --     Liver Function Tests:  Recent Labs Lab 04/15/16 0637  AST 22  ALT 19  ALKPHOS 58  BILITOT 0.6  PROT 7.3  ALBUMIN 3.0*   No results for input(s): LIPASE, AMYLASE in the last 168 hours. No results for input(s): AMMONIA in the last 168 hours. Coagulation Profile:  Recent Labs Lab 04/16/16 0758  INR  1.29   Cardiac Enzymes:  Recent Labs Lab 04/15/16 0914 04/15/16 1439 04/15/16 2115  TROPONINI 0.03* <0.03 0.07*   BNP (last 3 results) No results for input(s): PROBNP in the last 8760 hours. CBG:  Recent Labs Lab 04/15/16 2033 04/16/16 0736 04/16/16 1119  GLUCAP 98 75 129*   Studies: Dg Chest Port 1 View  Result Date: 04/16/2016 CLINICAL DATA:  Pneumonia. EXAM: PORTABLE CHEST 1 VIEW COMPARISON:  Radiographs of April 15, 2016. FINDINGS: Stable cardiomegaly. Increased bilateral perihilar and basilar interstitial densities are noted concerning for pulmonary edema. Atherosclerosis of thoracic aorta is noted. No pneumothorax is noted. Mild left pleural effusion is noted. Bony thorax is unremarkable. IMPRESSION: Increased bilateral pulmonary edema. Aortic atherosclerosis. Mild left pleural effusion. Electronically Signed   By: Marijo Conception, M.D.   On: 04/16/2016 08:15    Scheduled Meds: . aspirin  81 mg Oral Daily  . atorvastatin  40 mg Oral QHS  . [START ON 04/17/2016] azithromycin  250 mg Oral Daily  . budesonide (PULMICORT) nebulizer solution  0.25 mg Nebulization BID  . cefTRIAXone (ROCEPHIN)  IV  1 g Intravenous Q24H  . darifenacin  7.5 mg Oral Daily  .  docusate sodium  100 mg Oral Daily  . donepezil  10 mg Oral QHS  . guaiFENesin  600 mg Oral BID  . ipratropium-albuterol  3 mL Nebulization Q6H  . polyethylene glycol  17 g Oral Daily  . QUEtiapine  12.5-25 mg Oral QHS  . sodium chloride flush  3 mL Intravenous Q12H   Continuous Infusions: PRN Meds: acetaminophen **OR** acetaminophen, albuterol, haloperidol, ondansetron **OR** ondansetron (ZOFRAN) IV, traZODone  Time spent: 35 minutes  Author: Berle Mull, MD [facility] Pager: [phone] 04/16/2016 3:41 PM  If 7PM-7AM, please contact night-coverage at [email or web address], password [email or web address]

## 2016-04-16 NOTE — Progress Notes (Signed)
Patient did not get a dinner tray. Dietary notified for house tray. Family at bedside and made aware.

## 2016-04-16 NOTE — Progress Notes (Signed)
Initial Nutrition Assessment  DOCUMENTATION CODES:   Severe malnutrition in context of chronic illness, Underweight  INTERVENTION:  Provide Ensure Enlive po TID after meals, each supplement provides 350 kcal and 20 grams of protein  Change diet to Dysphagia 3 (mechanical soft) diet  Recommend monitoring magnesium, potassium, and phosphorus daily for at least 3 days, MD to replete as needed, as pt is at risk for refeeding syndrome given SEVERE MALNUTRITION.   NUTRITION DIAGNOSIS:   Malnutrition related to catabolic illness, chronic illness as evidenced by severe depletion of body fat, severe depletion of muscle mass.   GOAL:   Patient will meet greater than or equal to 90% of their needs   MONITOR:   PO intake, Supplement acceptance, Labs, Skin, Weight trends, I & O's  REASON FOR ASSESSMENT:   Low Braden    ASSESSMENT:   83 y.o. male with pmhx of agitation, artifical pig heart valve, blind, COPD, HTN, stroke, anemia who came to ED due to fever. X-Ray showed pneumonia.  Pt difficult to wake. Responsive, but did not open eyes. He states that his appetite has been "okay". Asked if he drinks Ensure or Boost at home, he states yes, but he hasn't been drinking them recently. He likes all flavors of Ensure and Boost. Per chart, pt was on mechanical soft diet PTA. Pt confirms that he needs foods that are easy to chew.   Pt has severe muscle wasting (clavicles, temples, patellars, thighs) and severe fast wasting (ribs)  Labs: low hemoglobin, low potassium, low calcium  Diet Order:  Diet regular Room service appropriate? Yes; Fluid consistency: Thin  Skin:  Reviewed, no issues  Last BM:  PTA  Height:   Ht Readings from Last 1 Encounters:  04/15/16 5\' 6"  (1.676 m)    Weight:   Wt Readings from Last 1 Encounters:  04/16/16 114 lb 3.2 oz (51.8 kg)    Ideal Body Weight:  64.55 kg  BMI:  Body mass index is 18.43 kg/m.  Estimated Nutritional Needs:   Kcal:   1550-1800  Protein:  78-90 grams  Fluid:  1.6 L/day  EDUCATION NEEDS:   No education needs identified at this time  Scarlette Ar RD, LDN, CSP Inpatient Clinical Dietitian Pager: [phone] After Hours Pager: [phone]

## 2016-04-16 NOTE — Progress Notes (Signed)
Assumed care at 2300, pt resting in bed alert to self only, no family at bedside, unable to do admission history at this time.

## 2016-04-17 DIAGNOSIS — E43 Unspecified severe protein-calorie malnutrition: Secondary | ICD-10-CM

## 2016-04-17 DIAGNOSIS — I10 Essential (primary) hypertension: Secondary | ICD-10-CM

## 2016-04-17 DIAGNOSIS — J181 Lobar pneumonia, unspecified organism: Secondary | ICD-10-CM

## 2016-04-17 DIAGNOSIS — A419 Sepsis, unspecified organism: Principal | ICD-10-CM

## 2016-04-17 DIAGNOSIS — D649 Anemia, unspecified: Secondary | ICD-10-CM

## 2016-04-17 DIAGNOSIS — E785 Hyperlipidemia, unspecified: Secondary | ICD-10-CM

## 2016-04-17 DIAGNOSIS — F0391 Unspecified dementia with behavioral disturbance: Secondary | ICD-10-CM

## 2016-04-17 LAB — CBC
HCT: 25.1 % — ABNORMAL LOW (ref 39.0–52.0)
HCT: 28.6 % — ABNORMAL LOW (ref 39.0–52.0)
Hemoglobin: 7.7 g/dL — ABNORMAL LOW (ref 13.0–17.0)
Hemoglobin: 8.9 g/dL — ABNORMAL LOW (ref 13.0–17.0)
MCH: 27 pg (ref 26.0–34.0)
MCH: 27.6 pg (ref 26.0–34.0)
MCHC: 30.7 g/dL (ref 30.0–36.0)
MCHC: 31.1 g/dL (ref 30.0–36.0)
MCV: 88.1 fL (ref 78.0–100.0)
MCV: 88.8 fL (ref 78.0–100.0)
PLATELETS: 142 10*3/uL — AB (ref 150–400)
PLATELETS: 148 10*3/uL — AB (ref 150–400)
RBC: 2.85 MIL/uL — AB (ref 4.22–5.81)
RBC: 3.22 MIL/uL — ABNORMAL LOW (ref 4.22–5.81)
RDW: 14 % (ref 11.5–15.5)
RDW: 14 % (ref 11.5–15.5)
WBC: 7.4 10*3/uL (ref 4.0–10.5)
WBC: 8.2 10*3/uL (ref 4.0–10.5)

## 2016-04-17 LAB — GLUCOSE, CAPILLARY
GLUCOSE-CAPILLARY: 135 mg/dL — AB (ref 65–99)
Glucose-Capillary: 89 mg/dL (ref 65–99)
Glucose-Capillary: 88 mg/dL (ref 65–99)

## 2016-04-17 LAB — BASIC METABOLIC PANEL
ANION GAP: 7 (ref 5–15)
BUN: 15 mg/dL (ref 6–20)
CALCIUM: 8.3 mg/dL — AB (ref 8.9–10.3)
CO2: 26 mmol/L (ref 22–32)
Chloride: 108 mmol/L (ref 101–111)
Creatinine, Ser: 0.77 mg/dL (ref 0.61–1.24)
GFR calc non Af Amer: >60 mL/min (ref >60)
GLUCOSE: 113 mg/dL — AB (ref 65–99)
POTASSIUM: 5.1 mmol/L (ref 3.5–5.1)
SODIUM: 141 mmol/L (ref 135–145)

## 2016-04-17 LAB — HEMOGLOBIN AND HEMATOCRIT, BLOOD
HEMATOCRIT: 28.6 % — AB (ref 39.0–52.0)
HEMOGLOBIN: 8.8 g/dL — AB (ref 13.0–17.0)

## 2016-04-17 LAB — MAGNESIUM: Magnesium: 2.1 mg/dL (ref 1.7–2.4)

## 2016-04-17 MED ORDER — FUROSEMIDE 20 MG PO TABS: 20.0000 mg | ORAL_TABLET | Freq: Every day | ORAL | Status: DC | PRN

## 2016-04-17 NOTE — Progress Notes (Addendum)
PROGRESS NOTE    Aaron Johnson  YWV:[id number] DOB: [birth year] DOA: 04/15/2016 PCP: Kandice Hams, MD   Chief Complaint  Patient presents with  . Fever     Brief Narrative:  Pt. with PMH of dementia, bioprosthetic AVR, CVA, permanent blindness, adenopathy since anemia, COPD, chronic diastolic CHF; admitted on 04/15/2016, with complaint of fever and shortness of breath, was found to have community-acquired pneumonia. Also mild acute on chronic diastolic CHF. Currently further plan is continue close monitoring. Assessment & Plan   Sepsis secondary to community-acquired pneumonia -Presented with fever and leukocytosis, meeting sepsis criteria. Was on Augmentin as an outpatient despite however continued to have fever -Placed on ceftriaxone and azithromycin, currently on oral azithromycin and IV ceftriaxone -blood cultures show no growth to date -Urine culture negative. -Continues to cough -Patient high-risk for bacteremia given sepsis criteria as well as history of aortic valve replacement. -Bilateral expiratory wheezing still present with bronchospasm, continue duo nebs at present. -Continue guaifenesin, will add on flutter valve  Acute on chronic combined systolic and diastolic CHF -Patient received IV fluids given sepsis physiology on presentation. -Currently has mild volume overload although no respiratory distress. -Echocardiogram 08/03/2013 showed an EF of 85-46%, grade 1 diastolic dysfunction -IV fluids discontinued -Restarted home dose of Lasix and will continue to monitor closely -Losartan held as patient has soft blood pressure  Proximal atrial fibrillation -History of A. fib although not on any anticoagulation, not on any rate control medication as well.  -Currently rate controlled A. fib.  Mild elevated troponin -Troponin peaked at 0.07 -Likely secondary to CHF, sepsis, demand ischemia -No chest pain -Continue aspirin  Severe protein calorie  malnutrition. Appreciate nutritional services consultation. We will monitor magnesium phosphorous as the patient is at high risk for refeeding syndrome.  Dementia, with behavioral issues. Currently no concerns no agitation. Continue home regimen at present.  Iron deficiency anemia. -Acute dilutional drop in hemoglobin, down to 7.7, will repeat today -Baseline hemoglobin in the range of 10. -Normal reticulocyte count, normal INR. -Pending FOBT -Continue iron supplementation  DVT Prophylaxis  SCDs  Code Status: Full  Family Communication: Daughter at bedside  Disposition Plan: Admitted. Pending PT/OT, and monitor hemoglobin.  Consultants None  Procedures  None  Antibiotics   Anti-infectives    Start     Dose/Rate Route Frequency Ordered Stop   04/17/16 1000  azithromycin (ZITHROMAX) tablet 250 mg     250 mg Oral Daily 04/16/16 0918 04/20/16 0959   04/16/16 0800  azithromycin (ZITHROMAX) 250 mg in dextrose 5 % 125 mL IVPB  Status:  Discontinued     250 mg 125 mL/hr over 60 Minutes Intravenous Every 24 hours 04/15/16 0804 04/16/16 0917   04/16/16 0800  cefTRIAXone (ROCEPHIN) 1 g in dextrose 5 % 50 mL IVPB     1 g 100 mL/hr over 30 Minutes Intravenous Every 24 hours 04/15/16 0811     04/15/16 0715  cefTRIAXone (ROCEPHIN) 1 g in dextrose 5 % 50 mL IVPB     1 g 100 mL/hr over 30 Minutes Intravenous  Once 04/15/16 0701 04/15/16 0814   04/15/16 0715  azithromycin (ZITHROMAX) 500 mg in dextrose 5 % 250 mL IVPB     500 mg 250 mL/hr over 60 Minutes Intravenous  Once 04/15/16 0701 04/15/16 0916   04/15/16 0700  piperacillin-tazobactam (ZOSYN) IVPB 3.375 g  Status:  Discontinued     3.375 g 100 mL/hr over 30 Minutes Intravenous  Once 04/15/16 0657 04/15/16 0700   04/15/16  0700  vancomycin (VANCOCIN) IVPB 1000 mg/200 mL premix  Status:  Discontinued     1,000 mg 200 mL/hr over 60 Minutes Intravenous  Once 04/15/16 0657 04/15/16 0700      Subjective:   Benancio Deeds seen and  examined today.  Continues to cough. Denies shortness of breath or chest pain. Denies abdominal pain, N/V/D.  Has not had a bowel movement.   Objective:   Vitals:   04/16/16 1300 04/16/16 1921 04/16/16 2125 04/17/16 0459  BP: 105/67  121/64 (!) 104/55  Pulse: 79  89 77  Resp: 18  18 18   Temp: 98.5 F (36.9 C)  98.7 F (37.1 C) 98.7 F (37.1 C)  TempSrc: Oral  Oral Oral  SpO2: 98% 98% 97% 98%  Weight:    56.5 kg (124 lb 8 oz)  Height:        Intake/Output Summary (Last 24 hours) at 04/17/16 0952 Last data filed at 04/17/16 0945  Gross per 24 hour  Intake              293 ml  Output              600 ml  Net             -307 ml   Filed Weights   04/15/16 1700 04/16/16 0653 04/17/16 0459  Weight: 48.3 kg (106 lb 8 oz) 51.8 kg (114 lb 3.2 oz) 56.5 kg (124 lb 8 oz)    Exam  General: Well developed, well nourished, NAD, appears stated age  HEENT: NCAT, mucous membranes moist.   Cardiovascular: S1 S2 auscultated, +SEM, irregular  Respiratory: Diminished breath sounds  Abdomen: Soft, nontender, nondistended, + bowel sounds  Extremities: warm dry without cyanosis clubbing or edema  Neuro: AAOx2, blindness  Psych: Normal affect and demeanor    Data Reviewed: I have personally reviewed following labs and imaging studies  CBC:  Recent Labs Lab 04/15/16 0637 04/15/16 1214 04/16/16 0329 04/16/16 0758 04/17/16 0251 04/17/16 0836  WBC 14.0* 10.7* 9.9 10.0 7.4  --   NEUTROABS 10.9*  --   --   --   --   --   HGB 9.4* 9.2* 7.8* 8.5* 7.7* 8.8*  HCT 29.7* 28.9* 23.8* 27.6* 25.1* 28.6*  MCV 88.1 88.1 87.2 88.2 88.1  --   PLT 140* 126* 105* 131* 142*  --    Basic Metabolic Panel:  Recent Labs Lab 04/15/16 0637 04/15/16 0845 04/16/16 0329 04/17/16 0251  NA 141  --  139 141  K 3.5  --  3.3* 5.1  CL 110  --  109 108  CO2 24  --  25 26  GLUCOSE 116*  --  100* 113*  BUN 20  --  16 15  CREATININE 0.87 0.83 0.78 0.77  CALCIUM 8.4*  --  7.7* 8.3*  MG  --  2.1  --   2.1  PHOS  --  2.7  --   --    GFR: Estimated Creatinine Clearance: 52 mL/min (by C-G formula based on SCr of 0.77 mg/dL). Liver Function Tests:  Recent Labs Lab 04/15/16 0637  AST 22  ALT 19  ALKPHOS 58  BILITOT 0.6  PROT 7.3  ALBUMIN 3.0*   No results for input(s): LIPASE, AMYLASE in the last 168 hours. No results for input(s): AMMONIA in the last 168 hours. Coagulation Profile:  Recent Labs Lab 04/16/16 0758  INR 1.29   Cardiac Enzymes:  Recent Labs Lab 04/15/16 0914 04/15/16 1439 04/15/16  2115  TROPONINI 0.03* <0.03 0.07*   BNP (last 3 results) No results for input(s): PROBNP in the last 8760 hours. HbA1C: No results for input(s): HGBA1C in the last 72 hours. CBG:  Recent Labs Lab 04/16/16 0736 04/16/16 1119 04/16/16 1645 04/16/16 2124 04/17/16 0747  GLUCAP 75 129* 115* 142* 89   Lipid Profile: No results for input(s): CHOL, HDL, LDLCALC, TRIG, CHOLHDL, LDLDIRECT in the last 72 hours. Thyroid Function Tests: No results for input(s): TSH, T4TOTAL, FREET4, T3FREE, THYROIDAB in the last 72 hours. Anemia Panel:  Recent Labs  04/16/16 0758  RETICCTPCT 1.2   Urine analysis:    Component Value Date/Time   COLORURINE YELLOW 04/15/2016 0727   APPEARANCEUR CLEAR 04/15/2016 0727   LABSPEC 1.018 04/15/2016 0727   PHURINE 5.0 04/15/2016 0727   GLUCOSEU NEGATIVE 04/15/2016 0727   HGBUR MODERATE (A) 04/15/2016 0727   BILIRUBINUR NEGATIVE 04/15/2016 0727   KETONESUR NEGATIVE 04/15/2016 0727   PROTEINUR 30 (A) 04/15/2016 0727   UROBILINOGEN 1.0 11/15/2014 1802   NITRITE NEGATIVE 04/15/2016 0727   LEUKOCYTESUR NEGATIVE 04/15/2016 0727   Sepsis Labs: @LABRCNTIP (procalcitonin:4,lacticidven:4)  ) Recent Results (from the past 240 hour(s))  Blood Culture (routine x 2)     Status: None (Preliminary result)   Collection Time: 04/15/16  6:35 AM  Result Value Ref Range Status   Specimen Description BLOOD RIGHT FOREARM  Final   Special Requests IN  PEDIATRIC BOTTLE Blood Culture adequate volume  Final   Culture NO GROWTH 1 DAY  Final   Report Status PENDING  Incomplete  Blood Culture (routine x 2)     Status: None (Preliminary result)   Collection Time: 04/15/16  6:40 AM  Result Value Ref Range Status   Specimen Description BLOOD RIGHT HAND  Final   Special Requests IN PEDIATRIC BOTTLE Blood Culture adequate volume  Final   Culture NO GROWTH 1 DAY  Final   Report Status PENDING  Incomplete  Urine culture     Status: None   Collection Time: 04/15/16  7:22 AM  Result Value Ref Range Status   Specimen Description URINE, CATHETERIZED  Final   Special Requests NONE  Final   Culture NO GROWTH  Final   Report Status 04/16/2016 FINAL  Final      Radiology Studies: Dg Chest Port 1 View  Result Date: 04/16/2016 CLINICAL DATA:  Pneumonia. EXAM: PORTABLE CHEST 1 VIEW COMPARISON:  Radiographs of April 15, 2016. FINDINGS: Stable cardiomegaly. Increased bilateral perihilar and basilar interstitial densities are noted concerning for pulmonary edema. Atherosclerosis of thoracic aorta is noted. No pneumothorax is noted. Mild left pleural effusion is noted. Bony thorax is unremarkable. IMPRESSION: Increased bilateral pulmonary edema. Aortic atherosclerosis. Mild left pleural effusion. Electronically Signed   By: Marijo Conception, M.D.   On: 04/16/2016 08:15     Scheduled Meds: . aspirin  81 mg Oral Daily  . atorvastatin  40 mg Oral QHS  . azithromycin  250 mg Oral Daily  . budesonide (PULMICORT) nebulizer solution  0.25 mg Nebulization BID  . cefTRIAXone (ROCEPHIN)  IV  1 g Intravenous Q24H  . darifenacin  7.5 mg Oral Daily  . docusate sodium  100 mg Oral Daily  . donepezil  10 mg Oral QHS  . guaiFENesin  600 mg Oral BID  . ipratropium-albuterol  3 mL Nebulization TID  . polyethylene glycol  17 g Oral Daily  . QUEtiapine  12.5-25 mg Oral QHS  . sodium chloride flush  3 mL Intravenous Q12H  Continuous Infusions:   LOS: 2 days   Time  Spent in minutes   30 minutes  Jeyren Danowski D.O. on 04/17/2016 at 9:52 AM  Between 7am to 7pm - Pager - [phone]  After 7pm go to [email or web address] - password [email or web address]  And look for the night coverage person covering for me after hours  [facility] Office  [phone]

## 2016-04-17 NOTE — Progress Notes (Signed)
Pt's hgb=7.7, [place] NP notified and ordered repeat CBC.

## 2016-04-17 NOTE — Evaluation (Signed)
Physical Therapy Evaluation Patient Details Name: Aaron Johnson MRN: [id number] DOB: Dec 15, 1926 Today's Date: 04/17/2016   History of Present Illness  Pt is an 83 y/o male admitted from home secondary to presenting with a fever. Pt found to have PNA. PMH including but not limited to dementia, visual impairments bilaterally, CHF, HTN, hx of CVA and hx of AVR.   Clinical Impression  Pt presented supine in bed with HOB elevated, awake and willing to participate in therapy session. Pt with history of dementia at baseline and no caregiver/family member present to provide any reliable information regarding PLOF and home environment. Pt currently requires max to total A x2 for bed mobility and transfers. Pt with c/o pain with movement in L foot/LE (unable to specify exact location). Pt would continue to benefit from skilled physical therapy services at this time while admitted and after d/c to address the below listed limitations in order to improve overall safety and independence with functional mobility.     Follow Up Recommendations SNF;Supervision/Assistance - 24 hour    Equipment Recommendations  None recommended by PT    Recommendations for Other Services       Precautions / Restrictions Precautions Precautions: Fall Precaution Comments: visually impaired bilaterally Restrictions Weight Bearing Restrictions: No      Mobility  Bed Mobility Overal bed mobility: Needs Assistance Bed Mobility: Supine to Sit     Supine to sit: Total assist;+2 for physical assistance     General bed mobility comments: total A x2 for all aspects with use of bed pads to position pt's hips at EOB  Transfers Overall transfer level: Needs assistance Equipment used: 2 person hand held assist Transfers: Sit to/from [facility] Sit to Stand: Max assist;+2 physical assistance Stand pivot transfers: Total assist;+2 physical assistance       General transfer comment: increased time,  VC'ing for sequencing and technique, max A x2 to rise from bed in an elevated position and total A x2 for pivotal movements to chair  Ambulation/Gait                Stairs            Wheelchair Mobility    Modified Rankin (Stroke Patients Only)       Balance Overall balance assessment: Needs assistance Sitting-balance support: Feet supported Sitting balance-Leahy Scale: Poor Sitting balance - Comments: progressing from min A to min guard for safety   Standing balance support: No upper extremity supported Standing balance-Leahy Scale: Poor Standing balance comment: max A x2 to maintain standing                             Pertinent Vitals/Pain Pain Assessment: Faces Faces Pain Scale: Hurts even more Pain Location: L foot Pain Descriptors / Indicators: Sore;Grimacing;Guarding Pain Intervention(s): Monitored during session;Repositioned    Home Living Family/patient expects to be discharged to:: Unsure Living Arrangements: Children               Additional Comments: pt stated that he lives with his daughter. He reported that he ambulates with use of SPC and needs his daughter's assistance with bathing/dressing. However, pt with hx of cognitive deficits at baseline (dementia) and no family/caregivers present during evaluation.    Prior Function Level of Independence: Needs assistance   Gait / Transfers Assistance Needed: pt stated he ambulates with a SPC  ADL's / Homemaking Assistance Needed: needs assistance from daughter  Hand Dominance        Extremity/Trunk Assessment   Upper Extremity Assessment Upper Extremity Assessment: Generalized weakness;LUE deficits/detail LUE Deficits / Details: L fingers and hand with musculoskeletal deformities (unknown etiology or history)    Lower Extremity Assessment Lower Extremity Assessment: Generalized weakness;LLE deficits/detail LLE Deficits / Details: pt with reported pain in L LE and  foot with movement but unable to specify where exactly       Communication   Communication: No difficulties  Cognition Arousal/Alertness: Awake/alert Behavior During Therapy: Flat affect Overall Cognitive Status: History of cognitive impairments - at baseline Area of Impairment: Memory;Following commands;Safety/judgement;Problem solving                     Memory: Decreased short-term memory Following Commands: Follows one step commands inconsistently;Follows one step commands with increased time Safety/Judgement: Decreased awareness of deficits;Decreased awareness of safety   Problem Solving: Slow processing;Decreased initiation;Difficulty sequencing;Requires verbal cues;Requires tactile cues        General Comments      Exercises     Assessment/Plan    PT Assessment Patient needs continued PT services  PT Problem List Decreased strength;Decreased activity tolerance;Decreased balance;Decreased mobility;Decreased coordination;Decreased cognition;Decreased knowledge of use of DME;Decreased safety awareness;Pain       PT Treatment Interventions DME instruction;Gait training;Functional mobility training;Therapeutic activities;Therapeutic exercise;Balance training;Neuromuscular re-education;Cognitive remediation;Patient/family education    PT Goals (Current goals can be found in the Care Plan section)  Acute Rehab PT Goals Patient Stated Goal: unable to state PT Goal Formulation: Patient unable to participate in goal setting Time For Goal Achievement: 05/01/16 Potential to Achieve Goals: Fair    Frequency Min 2X/week   Barriers to discharge        Co-evaluation               End of Session Equipment Utilized During Treatment: Gait belt Activity Tolerance: Patient limited by fatigue;Patient limited by pain Patient left: in chair;with call bell/phone within reach;with chair alarm set Nurse Communication: Mobility status;Need for lift equipment PT Visit  Diagnosis: Other abnormalities of gait and mobility (R26.89);Pain Pain - Right/Left: Left Pain - part of body: Leg;Ankle and joints of foot    Time: 4818-5631 PT Time Calculation (min) (ACUTE ONLY): 18 min   Charges:   PT Evaluation $PT Eval Moderate Complexity: 1 Procedure     PT G Codes:        Sherie Don, PT, DPT [place] 04/17/2016, 11:19 AM

## 2016-04-18 ENCOUNTER — Inpatient Hospital Stay (HOSPITAL_COMMUNITY): Payer: Medicare Other

## 2016-04-18 DIAGNOSIS — K5900 Constipation, unspecified: Secondary | ICD-10-CM

## 2016-04-18 LAB — CBC
HEMATOCRIT: 25.9 % — AB (ref 39.0–52.0)
HEMOGLOBIN: 8.3 g/dL — AB (ref 13.0–17.0)
MCH: 28.1 pg (ref 26.0–34.0)
MCHC: 32 g/dL (ref 30.0–36.0)
MCV: 87.8 fL (ref 78.0–100.0)
Platelets: 159 10*3/uL (ref 150–400)
RBC: 2.95 MIL/uL — AB (ref 4.22–5.81)
RDW: 14.2 % (ref 11.5–15.5)
WBC: 7.1 10*3/uL (ref 4.0–10.5)

## 2016-04-18 LAB — FERRITIN: FERRITIN: 611 ng/mL — AB (ref 24–336)

## 2016-04-18 LAB — BASIC METABOLIC PANEL
Anion gap: 6 (ref 5–15)
BUN: 14 mg/dL (ref 6–20)
CALCIUM: 8.5 mg/dL — AB (ref 8.9–10.3)
CHLORIDE: 109 mmol/L (ref 101–111)
CO2: 27 mmol/L (ref 22–32)
CREATININE: 0.69 mg/dL (ref 0.61–1.24)
GFR calc Af Amer: >60 mL/min (ref >60)
Glucose, Bld: 103 mg/dL — ABNORMAL HIGH (ref 65–99)
Potassium: 4.8 mmol/L (ref 3.5–5.1)
SODIUM: 142 mmol/L (ref 135–145)

## 2016-04-18 LAB — RETICULOCYTES
RBC.: 3.15 MIL/uL — AB (ref 4.22–5.81)
Retic Count, Absolute: 56.7 10*3/uL (ref 19.0–186.0)
Retic Ct Pct: 1.8 % (ref 0.4–3.1)

## 2016-04-18 LAB — GLUCOSE, CAPILLARY
GLUCOSE-CAPILLARY: 83 mg/dL (ref 65–99)
GLUCOSE-CAPILLARY: 97 mg/dL (ref 65–99)
Glucose-Capillary: 129 mg/dL — ABNORMAL HIGH (ref 65–99)

## 2016-04-18 LAB — FOLATE: Folate: 13.4 ng/mL (ref >5.9)

## 2016-04-18 LAB — VITAMIN B12: Vitamin B-12: 1002 pg/mL — ABNORMAL HIGH (ref 180–914)

## 2016-04-18 LAB — IRON AND TIBC
Iron: 44 ug/dL — ABNORMAL LOW (ref 45–182)
Saturation Ratios: 22 % (ref 17.9–39.5)
TIBC: 203 ug/dL — ABNORMAL LOW (ref 250–450)
UIBC: 159 ug/dL

## 2016-04-18 LAB — OCCULT BLOOD X 1 CARD TO LAB, STOOL: FECAL OCCULT BLD: NEGATIVE

## 2016-04-18 MED ORDER — BISACODYL 10 MG RE SUPP
10.0000 mg | Freq: Once | RECTAL | Status: AC
  Administered 2016-04-18: 10 mg via RECTAL
  Filled 2016-04-18: qty 1

## 2016-04-18 NOTE — Progress Notes (Signed)
Dear Doctor: Aaron Johnson This patient has been identified as a candidate for PICC for the following reason (s): IV therapy over 48 hours, poor veins/poor circulatory system (CHF, COPD, emphysema, diabetes, steroid use, IV drug abuse, etc.) and restarts due to phlebitis and infiltration in 24 hours If you agree, please write an order for the indicated device. For any questions contact the Vascular Access Team at [phone] if no answer, please leave a message.  Thank you for supporting the early vascular access assessment program.

## 2016-04-18 NOTE — Progress Notes (Signed)
PROGRESS NOTE    Aaron Johnson  GXQ:[id number] DOB: [birth year] DOA: 04/15/2016 PCP: Kandice Hams, MD   Chief Complaint  Patient presents with  . Fever     Brief Narrative:  Pt. with PMH of dementia, bioprosthetic AVR, CVA, permanent blindness, adenopathy since anemia, COPD, chronic diastolic CHF; admitted on 04/15/2016, with complaint of fever and shortness of breath, was found to have community-acquired pneumonia. Also mild acute on chronic diastolic CHF. Currently further plan is continue close monitoring. Assessment & Plan   Sepsis secondary to community-acquired pneumonia -Presented with fever and leukocytosis, meeting sepsis criteria. Was on Augmentin as an outpatient despite however continued to have fever -Placed on ceftriaxone and azithromycin, currently on oral azithromycin and IV ceftriaxone -blood cultures show no growth to date -Urine culture negative. -Continues to cough -Patient high-risk for bacteremia given sepsis criteria as well as history of aortic valve replacement. -Bilateral expiratory wheezing still present with bronchospasm, continue duo nebs at present. -Continue guaifenesin  Acute on chronic combined systolic and diastolic CHF -Patient received IV fluids given sepsis physiology on presentation. -Currently has mild volume overload although no respiratory distress. -Echocardiogram 08/03/2013 showed an EF of 14-48%, grade 1 diastolic dysfunction -IV fluids discontinued -Restarted home dose of Lasix and will continue to monitor closely -Losartan held as patient has soft blood pressure  Proximal atrial fibrillation -History of A. fib although not on any anticoagulation, not on any rate control medication as well.  -Currently rate controlled A. fib.  Mild elevated troponin -Troponin peaked at 0.07 -Likely secondary to CHF, sepsis, demand ischemia -No chest pain -Continue aspirin  Severe protein calorie malnutrition. -Appreciate nutritional  services consultation. -continue feeding supplements  Dementia, with behavioral issues. -Currently no concerns no agitation. -Continue home regimen at present.  Iron deficiency anemia. -Acute dilutional drop in hemoglobin, down to 7.7, now 8.3 -Baseline hemoglobin in the range of 10. -Normal reticulocyte count, normal INR. -Pending FOBT  -Per daughter was on iron supplementation -Anemia panel: Iron 44, Ferritin 611, B12 1002  Constipation -Will add on dulcolax suppository -continue miralax/colace -KUB  Deconditioning -PT and OT recommended SNF -patient's daughter would like to take him home with [facility]  DVT Prophylaxis  SCDs  Code Status: Full  Family Communication: Daughter at bedside  Disposition Plan: Admitted. Pending bowel movement.  Consultants None  Procedures  None  Antibiotics   Anti-infectives    Start     Dose/Rate Route Frequency Ordered Stop   04/17/16 1000  azithromycin (ZITHROMAX) tablet 250 mg     250 mg Oral Daily 04/16/16 0918 04/20/16 0959   04/16/16 0800  azithromycin (ZITHROMAX) 250 mg in dextrose 5 % 125 mL IVPB  Status:  Discontinued     250 mg 125 mL/hr over 60 Minutes Intravenous Every 24 hours 04/15/16 0804 04/16/16 0917   04/16/16 0800  cefTRIAXone (ROCEPHIN) 1 g in dextrose 5 % 50 mL IVPB     1 g 100 mL/hr over 30 Minutes Intravenous Every 24 hours 04/15/16 0811     04/15/16 0715  cefTRIAXone (ROCEPHIN) 1 g in dextrose 5 % 50 mL IVPB     1 g 100 mL/hr over 30 Minutes Intravenous  Once 04/15/16 0701 04/15/16 0814   04/15/16 0715  azithromycin (ZITHROMAX) 500 mg in dextrose 5 % 250 mL IVPB     500 mg 250 mL/hr over 60 Minutes Intravenous  Once 04/15/16 0701 04/15/16 0916   04/15/16 0700  piperacillin-tazobactam (ZOSYN) IVPB 3.375 g  Status:  Discontinued  3.375 g 100 mL/hr over 30 Minutes Intravenous  Once 04/15/16 0657 04/15/16 0700   04/15/16 0700  vancomycin (VANCOCIN) IVPB 1000 mg/200 mL premix  Status:  Discontinued     1,000  mg 200 mL/hr over 60 Minutes Intravenous  Once 04/15/16 0657 04/15/16 0700      Subjective:   Aaron Johnson seen and examined today.  Continues to cough, but feeling mildly better. Denies shortness of breath or chest pain. Denies abdominal pain, N/V/D.  Has not had a bowel movement. Daughter concerned about his constipation.  Objective:   Vitals:   04/18/16 0509 04/18/16 0525 04/18/16 0842 04/18/16 1154  BP: (!) 140/57   (!) 128/54  Pulse: 82   86  Resp: 17   18  Temp: 98.1 F (36.7 C)   98 F (36.7 C)  TempSrc: Oral   Oral  SpO2: 93%  95% 97%  Weight:  57.3 kg (126 lb 4.8 oz)    Height:        Intake/Output Summary (Last 24 hours) at 04/18/16 1203 Last data filed at 04/18/16 1103  Gross per 24 hour  Intake              893 ml  Output             1975 ml  Net            -1082 ml   Filed Weights   04/16/16 0653 04/17/16 0459 04/18/16 0525  Weight: 51.8 kg (114 lb 3.2 oz) 56.5 kg (124 lb 8 oz) 57.3 kg (126 lb 4.8 oz)    Exam  General: Well developed, well nourished, NAD, appears stated age  HEENT: NCAT, mucous membranes moist.   Cardiovascular: S1 S2 auscultated, +SEM, irregular  Respiratory: Diminished breath sounds  Abdomen: Soft, nontender, nondistended, + bowel sounds  Extremities: warm dry without cyanosis clubbing or edema  Neuro: AAOx2, blindness  Psych: Normal affect and demeanor    Data Reviewed: I have personally reviewed following labs and imaging studies  CBC:  Recent Labs Lab 04/15/16 0637  04/16/16 0329 04/16/16 0758 04/17/16 0251 04/17/16 0836 04/17/16 0947 04/18/16 0440  WBC 14.0*  < > 9.9 10.0 7.4  --  8.2 7.1  NEUTROABS 10.9*  --   --   --   --   --   --   --   HGB 9.4*  < > 7.8* 8.5* 7.7* 8.8* 8.9* 8.3*  HCT 29.7*  < > 23.8* 27.6* 25.1* 28.6* 28.6* 25.9*  MCV 88.1  < > 87.2 88.2 88.1  --  88.8 87.8  PLT 140*  < > 105* 131* 142*  --  148* 159  < > = values in this interval not displayed. Basic Metabolic Panel:  Recent  Labs Lab 04/15/16 0637 04/15/16 0845 04/16/16 0329 04/17/16 0251 04/18/16 0440  NA 141  --  139 141 142  K 3.5  --  3.3* 5.1 4.8  CL 110  --  109 108 109  CO2 24  --  25 26 27   GLUCOSE 116*  --  100* 113* 103*  BUN 20  --  16 15 14   CREATININE 0.87 0.83 0.78 0.77 0.69  CALCIUM 8.4*  --  7.7* 8.3* 8.5*  MG  --  2.1  --  2.1  --   PHOS  --  2.7  --   --   --    GFR: Estimated Creatinine Clearance: 52.7 mL/min (by C-G formula based on SCr of 0.69 mg/dL).  Liver Function Tests:  Recent Labs Lab 04/15/16 0637  AST 22  ALT 19  ALKPHOS 58  BILITOT 0.6  PROT 7.3  ALBUMIN 3.0*   No results for input(s): LIPASE, AMYLASE in the last 168 hours. No results for input(s): AMMONIA in the last 168 hours. Coagulation Profile:  Recent Labs Lab 04/16/16 0758  INR 1.29   Cardiac Enzymes:  Recent Labs Lab 04/15/16 0914 04/15/16 1439 04/15/16 2115  TROPONINI 0.03* <0.03 0.07*   BNP (last 3 results) No results for input(s): PROBNP in the last 8760 hours. HbA1C: No results for input(s): HGBA1C in the last 72 hours. CBG:  Recent Labs Lab 04/17/16 0747 04/17/16 1132 04/17/16 1630 04/18/16 0729 04/18/16 1141  GLUCAP 89 135* 88 83 129*   Lipid Profile: No results for input(s): CHOL, HDL, LDLCALC, TRIG, CHOLHDL, LDLDIRECT in the last 72 hours. Thyroid Function Tests: No results for input(s): TSH, T4TOTAL, FREET4, T3FREE, THYROIDAB in the last 72 hours. Anemia Panel:  Recent Labs  04/16/16 0758 04/18/16 0931  VITAMINB12  --  1,002*  FOLATE  --  13.4  FERRITIN  --  611*  TIBC  --  203*  IRON  --  44*  RETICCTPCT 1.2 1.8   Urine analysis:    Component Value Date/Time   COLORURINE YELLOW 04/15/2016 0727   APPEARANCEUR CLEAR 04/15/2016 0727   LABSPEC 1.018 04/15/2016 0727   PHURINE 5.0 04/15/2016 0727   GLUCOSEU NEGATIVE 04/15/2016 0727   HGBUR MODERATE (A) 04/15/2016 0727   BILIRUBINUR NEGATIVE 04/15/2016 0727   KETONESUR NEGATIVE 04/15/2016 0727   PROTEINUR  30 (A) 04/15/2016 0727   UROBILINOGEN 1.0 11/15/2014 1802   NITRITE NEGATIVE 04/15/2016 0727   LEUKOCYTESUR NEGATIVE 04/15/2016 0727   Sepsis Labs: @LABRCNTIP (procalcitonin:4,lacticidven:4)  ) Recent Results (from the past 240 hour(s))  Blood Culture (routine x 2)     Status: None (Preliminary result)   Collection Time: 04/15/16  6:35 AM  Result Value Ref Range Status   Specimen Description BLOOD RIGHT FOREARM  Final   Special Requests IN PEDIATRIC BOTTLE Blood Culture adequate volume  Final   Culture NO GROWTH 2 DAYS  Final   Report Status PENDING  Incomplete  Blood Culture (routine x 2)     Status: None (Preliminary result)   Collection Time: 04/15/16  6:40 AM  Result Value Ref Range Status   Specimen Description BLOOD RIGHT HAND  Final   Special Requests IN PEDIATRIC BOTTLE Blood Culture adequate volume  Final   Culture NO GROWTH 2 DAYS  Final   Report Status PENDING  Incomplete  Urine culture     Status: None   Collection Time: 04/15/16  7:22 AM  Result Value Ref Range Status   Specimen Description URINE, CATHETERIZED  Final   Special Requests NONE  Final   Culture NO GROWTH  Final   Report Status 04/16/2016 FINAL  Final      Radiology Studies: No results found.   Scheduled Meds: . aspirin  81 mg Oral Daily  . atorvastatin  40 mg Oral QHS  . azithromycin  250 mg Oral Daily  . budesonide (PULMICORT) nebulizer solution  0.25 mg Nebulization BID  . cefTRIAXone (ROCEPHIN)  IV  1 g Intravenous Q24H  . darifenacin  7.5 mg Oral Daily  . docusate sodium  100 mg Oral Daily  . donepezil  10 mg Oral QHS  . guaiFENesin  600 mg Oral BID  . polyethylene glycol  17 g Oral Daily  . QUEtiapine  12.5-25 mg  Oral QHS  . sodium chloride flush  3 mL Intravenous Q12H   Continuous Infusions:   LOS: 3 days   Time Spent in minutes   30 minutes  Russell Engelstad D.O. on 04/18/2016 at 12:03 PM  Between 7am to 7pm - Pager - [phone]  After 7pm go to [email or web address] - password  [email or web address]  And look for the night coverage person covering for me after hours  [facility] Office  [phone]

## 2016-04-19 LAB — CBC
HCT: 28.5 % — ABNORMAL LOW (ref 39.0–52.0)
HEMOGLOBIN: 8.8 g/dL — AB (ref 13.0–17.0)
MCH: 27 pg (ref 26.0–34.0)
MCHC: 30.9 g/dL (ref 30.0–36.0)
MCV: 87.4 fL (ref 78.0–100.0)
PLATELETS: 172 10*3/uL (ref 150–400)
RBC: 3.26 MIL/uL — ABNORMAL LOW (ref 4.22–5.81)
RDW: 14.1 % (ref 11.5–15.5)
WBC: 6.5 10*3/uL (ref 4.0–10.5)

## 2016-04-19 LAB — BASIC METABOLIC PANEL
ANION GAP: 11 (ref 5–15)
BUN: 10 mg/dL (ref 6–20)
CHLORIDE: 109 mmol/L (ref 101–111)
CO2: 24 mmol/L (ref 22–32)
Calcium: 8.4 mg/dL — ABNORMAL LOW (ref 8.9–10.3)
Creatinine, Ser: 0.72 mg/dL (ref 0.61–1.24)
Glucose, Bld: 81 mg/dL (ref 65–99)
POTASSIUM: 4.5 mmol/L (ref 3.5–5.1)
SODIUM: 144 mmol/L (ref 135–145)

## 2016-04-19 MED ORDER — SENNOSIDES-DOCUSATE SODIUM 8.6-50 MG PO TABS: 1.0000 | ORAL_TABLET | Freq: Every day | ORAL | 0 refills | Status: AC

## 2016-04-19 MED ORDER — GUAIFENESIN ER 600 MG PO TB12: 600.0000 mg | ORAL_TABLET | Freq: Two times a day (BID) | ORAL | 0 refills | Status: DC

## 2016-04-19 MED ORDER — CEPHALEXIN 500 MG PO CAPS: 500.0000 mg | ORAL_CAPSULE | Freq: Two times a day (BID) | ORAL | 0 refills | Status: AC

## 2016-04-19 MED ORDER — SENNOSIDES-DOCUSATE SODIUM 8.6-50 MG PO TABS
1.0000 | ORAL_TABLET | Freq: Two times a day (BID) | ORAL | Status: DC
  Administered 2016-04-19: 1 via ORAL
  Filled 2016-04-19: qty 1

## 2016-04-19 MED ORDER — CEPHALEXIN 500 MG PO CAPS: 500.0000 mg | ORAL_CAPSULE | Freq: Two times a day (BID) | ORAL | 0 refills | Status: DC

## 2016-04-19 MED ORDER — SENNOSIDES-DOCUSATE SODIUM 8.6-50 MG PO TABS: 1.0000 | ORAL_TABLET | Freq: Every day | ORAL | 0 refills | Status: DC

## 2016-04-19 NOTE — Care Management Important Message (Signed)
Important Message  Patient Details  Name: Aaron Johnson MRN: [id number] Date of Birth: 1926-07-28   Medicare Important Message Given:  Yes    Montey Ebel 04/19/2016, 1:16 PM

## 2016-04-19 NOTE — Clinical Social Work Note (Signed)
CSW and MD confirmed plans with patient's daughter for him to return home with home health instead of going to SNF. RNCM aware.  CSW signing off. Consult again if any other social work needs arise.  Aaron Johnson, [place]

## 2016-04-19 NOTE — Progress Notes (Signed)
Pt family concerned for taking pt home and having no power at the house. Case management informed and stated she will talk to the family at bedside.  Aaron Johnson

## 2016-04-19 NOTE — Progress Notes (Signed)
Pt discharge with daughter. Discharge education went over with family at bedside. Pt has all belongings and discharge paperwork. Pt discharged via wheelchair with nurse tech.  Aaron Johnson

## 2016-04-19 NOTE — Care Management Important Message (Signed)
Important Message  Patient Details  Name: Aaron Johnson MRN: [id number] Date of Birth: [birth year]   Medicare Important Message Given:       Orbie Pyo 04/19/2016, 1:17 PM

## 2016-04-19 NOTE — Care Management Note (Signed)
Case Management Note  Patient Details  Name: Aaron Johnson MRN: [id number] Date of Birth: 1926/11/04  Subjective/Objective:    Admitted with Pneumonia               Action/Plan: Patient lives at home with family members; CM talked to daughter and son at the bedside, they do not want SNF placement and is requesting to return home with [name] services; he has a personal care service/ nurses aide; [name] choice offered, patient requested [facility]; [name] with [name] called but they cannot provide [name]; [name] called, talked to [name] and they can accept the patient;  Expected Discharge Date:    possibly 04/19/2016             Expected Discharge Plan:  [name]  In-House Referral:  Clinical Social Work  Discharge planning Services  CM Consult  Choice offered to:  Adult Children  HH Arranged:  RN, Disease Management, PT, Social Work [name] Agency:  [facility]  Status of Service:  In process, will continue to follow  [name] [phone] 04/19/2016, 10:16 AM

## 2016-04-19 NOTE — Progress Notes (Signed)
Awaiting family to arrive to go over discharge paperwork. Pt IV discontinue, catheter intact and telemetry removed. Pt has all belongings packed.  Aldine Chakraborty Leory Plowman

## 2016-04-20 LAB — CULTURE, BLOOD (ROUTINE X 2)
CULTURE: NO GROWTH
CULTURE: NO GROWTH
SPECIAL REQUESTS: ADEQUATE
Special Requests: ADEQUATE

## 2016-04-22 NOTE — Discharge Summary (Signed)
[facility] Discharge Summary   Patient: Aaron Johnson [id number]:[id number]   PCP: Kandice Hams, MD DOB: August 12, 1926   Date of admission: 04/15/2016   Date of discharge: 04/19/2016    Discharge Diagnoses:  Principal Problem:   PNA (pneumonia) Active Problems:   HLD (hyperlipidemia)   HYPERTENSION, BENIGN SYSTEMIC   COPD (chronic obstructive pulmonary disease) (HCC)   Cardiomyopathy, dilated (HCC)   Dementia   Paroxysmal atrial fibrillation (HCC)   SIRS (systemic inflammatory response syndrome) (HCC)   Protein-calorie malnutrition, severe   Admitted From: home Disposition:  Home with family  Recommendations for Outpatient Follow-up:  1. Please follow up with PCP in 1 week   [facility].   Specialty:  [place] Why:  They will do your home health care at your home Contact information: [place] [address] [phone]          Diet recommendation: cardiac diet  Activity: The patient is advised to gradually reintroduce usual activities.  Discharge Condition: good  Code Status: full code  History of present illness: As per the H and P dictated on admission, "Aaron Johnson is a 83 y.o. male with pmhx of agitation, artifical pig heart valve, blind, COPD, HTN, stroke, anemia who came to ED due to fever. Per daughter gives most of the history due to patient's dementia. Patient has had a productive cough for 2 weeks. She tried Robitussin and a single dose ibuprofen for symptoms. This was mildly affected. Patient feels patient is slightly more agitated than normal & has been sleeping a lot more. His oral intake is down but he has received adequate fluids. Daughter states patient has not complained of headaches, chills, nausea, vomiting and has not had diarrhea."  Hospital Course:  Summary of his active problems in the hospital is as following. Sepsis secondary to  community-acquired pneumonia -Presented with fever and leukocytosis, meeting sepsis criteria.Was on Augmentin as an outpatient despite however continued to have fever -Placed on ceftriaxone and azithromycin, currently on oral azithromycin and oral keflex -blood cultures show no growth to date -Urine culture negative. -Continues to cough -Continue guaifenesin  Acute on chronic combined systolic and diastolic CHF -Patient received IV fluids given sepsis physiology on presentation. -Currently has mild volume overload although no respiratory distress. -Echocardiogram 08/03/2013 showed an EF of 60-63%, grade 1 diastolic dysfunction -IV fluids discontinued -Restarted home dose of Lasix -Losartan held as patient has soft blood pressure, resume on discharge.  Proximal atrial fibrillation -History of A. fib although not on any anticoagulation, not on any rate control medication as well.  -Currently rate controlled A. fib.  Mild elevated troponin -Troponin peaked at 0.07 -Likely secondary to CHF, sepsis, demand ischemia -No chest pain -Continue aspirin  Severe protein calorie malnutrition. -Appreciate nutritional services consultation. -continue feeding supplements  Dementia, with behavioral issues. -Currently no concerns no agitation. -Continue home regimen at present.  Iron deficiency anemia. -Acute dilutional drop in hemoglobin, down to 7.7, now stable -Baseline hemoglobin in the range of 10. -Normal reticulocyte count, normal INR. -negative FOBT  -Per daughter was oniron supplementation -Anemia panel: Iron 44, Ferritin 611, B12 1002  Constipation -Will add on dulcolax suppository -continue miralax/colace -KUB  Deconditioning -PT and OT recommended SNF -patient's daughter would like to take him home with [facility]  All other chronic medical condition were stable during the hospitalization.  Patient was seen by physical therapy, who recommended SNF  bt family wanted to  take him to home, home health was arranged by Education officer, museum and case Freight forwarder. On the day of the discharge the patient's vitals were stable, and no other acute medical condition were reported by patient. the patient was felt safe to be discharge at home with home health.  Procedures and Results:  none   Consultations:  none  DISCHARGE MEDICATION: Discharge Medication List as of 04/19/2016  3:19 PM    START taking these medications   Details  cephALEXin (KEFLEX) 500 MG capsule Take 1 capsule (500 mg total) by mouth 2 (two) times daily., Starting Mon 04/19/2016, Until Wed 04/21/2016, Normal    guaiFENesin (MUCINEX) 600 MG 12 hr tablet Take 1 tablet (600 mg total) by mouth 2 (two) times daily., Starting Mon 04/19/2016, Normal    senna-docusate (SENOKOT-S) 8.6-50 MG tablet Take 1 tablet by mouth at bedtime., Starting Mon 04/19/2016, Normal      CONTINUE these medications which have NOT CHANGED   Details  aspirin 81 MG tablet Take 1 tablet (81 mg total) by mouth daily. Resume after 7 days from 11/26/13, Starting 11/26/2013, Until Discontinued, No Print    atorvastatin (LIPITOR) 40 MG tablet Take 40 mg by mouth at bedtime. , Until Discontinued, Historical Med    docusate sodium (COLACE) 100 MG capsule Take 100 mg by mouth daily., Until Discontinued, Historical Med    donepezil (ARICEPT) 10 MG tablet Take 1 tablet by mouth at bedtime., Starting 08/24/2014, Until Discontinued, Historical Med    Fluticasone-Salmeterol (ADVAIR DISKUS) 250-50 MCG/DOSE AEPB Inhale 1 puff into the lungs 2 (two) times daily as needed (SOB). , Historical Med    furosemide (LASIX) 20 MG tablet Take 20 mg by mouth daily as needed for fluid or edema. For edema, Until Discontinued, Historical Med    haloperidol (HALDOL) 0.5 MG tablet Take 1 tablet (0.5 mg total) by mouth 2 (two) times daily., Starting 12/25/2013, Until Discontinued, Normal    losartan (COZAAR) 25 MG tablet Take 25 mg by mouth daily., Until Discontinued,  Historical Med    Multiple Vitamin (MULTIVITAMIN) tablet Take 1 tablet by mouth daily.  , Until Discontinued, Historical Med    oxyCODONE-acetaminophen (PERCOCET) 5-325 MG per tablet Take 0.5-1 tablets by mouth every 4 (four) hours as needed for moderate pain. , Until Discontinued, Historical Med    polyethylene glycol (MIRALAX / GLYCOLAX) packet Take 17 g by mouth daily., Until Discontinued, Historical Med    potassium chloride (KLOR-CON) 20 MEQ packet Take 20 mEq by mouth daily as needed (for edema when taking lasix). , Until Discontinued, Historical Med    QUEtiapine (SEROQUEL) 25 MG tablet Take 12.5-25 mg by mouth at bedtime. Depending on severity of sundowning, Until Discontinued, Historical Med    solifenacin (VESICARE) 5 MG tablet Take 5 mg by mouth daily., Until Discontinued, Historical Med    traZODone (DESYREL) 100 MG tablet Take 50-100 mg by mouth at bedtime as needed for sleep. , Starting 01/24/2014, Until Discontinued, Historical Med      STOP taking these medications     amoxicillin-clavulanate (AUGMENTIN) 875-125 MG tablet        No Known Allergies Discharge Instructions    Diet - low sodium heart healthy    Complete by:  As directed    Increase activity slowly    Complete by:  As directed      Discharge Exam: Filed Weights   04/17/16 0459 04/18/16 0525 04/19/16 0521  Weight: 56.5 kg (124 lb 8 oz) 57.3  kg (126 lb 4.8 oz) 56.2 kg (124 lb)   Vitals:   04/19/16 0800 04/19/16 1334  BP: 131/62 132/80  Pulse: 70 77  Resp:  18  Temp:  98 F (36.7 C)   General: Appear in no distress, no Rash; Oral Mucosa moist. Cardiovascular: S1 and S2 Present, aortic systolic Murmur, no JVD Respiratory: Bilateral Air entry present and Clear to Auscultation, no Crackles, no wheezes Abdomen: Bowel Sound present, Soft and no tenderness Extremities: no Pedal edema, no calf tenderness Neurology: Grossly no focal neuro deficit.  The results of significant diagnostics from this  hospitalization (including imaging, microbiology, ancillary and laboratory) are listed below for reference.    Significant Diagnostic Studies: Dg Chest 2 View  Result Date: 04/15/2016 CLINICAL DATA:  Acute onset of cough, fever, generalized weakness and sepsis. Initial encounter. EXAM: CHEST  2 VIEW COMPARISON:  Chest radiograph performed 03/22/2016 FINDINGS: The lungs are well-aerated. A small left pleural effusion is noted. Left basilar and mid lung airspace opacification is concerning for pneumonia. There is no evidence of pneumothorax. The known calcified granuloma at the right upper lobe is not well characterized on this study. The heart is borderline normal in size. The patient is status post median sternotomy. An aortic valve replacement is noted. No acute osseous abnormalities are seen. IMPRESSION: Small left pleural effusion noted.  Left-sided pneumonia noted. Electronically Signed   By: Garald Balding M.D.   On: 04/15/2016 06:54   Dg Chest Port 1 View  Result Date: 04/16/2016 CLINICAL DATA:  Pneumonia. EXAM: PORTABLE CHEST 1 VIEW COMPARISON:  Radiographs of April 15, 2016. FINDINGS: Stable cardiomegaly. Increased bilateral perihilar and basilar interstitial densities are noted concerning for pulmonary edema. Atherosclerosis of thoracic aorta is noted. No pneumothorax is noted. Mild left pleural effusion is noted. Bony thorax is unremarkable. IMPRESSION: Increased bilateral pulmonary edema. Aortic atherosclerosis. Mild left pleural effusion. Electronically Signed   By: Marijo Conception, M.D.   On: 04/16/2016 08:15   Dg Abd Portable 1v  Result Date: 04/19/2016 CLINICAL DATA:  Constipation. History of stroke, pneumonia, hypertension, emphysema, aortic valve replacement. EXAM: PORTABLE ABDOMEN - 1 VIEW COMPARISON:  CT abdomen and pelvis 11/15/2014 FINDINGS: Moderate gaseous distention of the stomach. Gas-filled small and large bowel without abnormal distention, likely representing ileus. No  radiopaque stones. Degenerative changes in the lumbar spine. Postoperative changes in the mediastinum. Suggestion of some infiltration in the left mid lung could represent pneumonia. IMPRESSION: Gas-filled nondistended small and large bowel suggesting ileus. Moderate gaseous distention of the stomach. Suggestion of infiltration in the left mid lung. Electronically Signed   By: Lucienne Capers M.D.   On: 04/19/2016 00:46    Microbiology: Recent Results (from the past 240 hour(s))  Blood Culture (routine x 2)     Status: None   Collection Time: 04/15/16  6:35 AM  Result Value Ref Range Status   Specimen Description BLOOD RIGHT FOREARM  Final   Special Requests IN PEDIATRIC BOTTLE Blood Culture adequate volume  Final   Culture NO GROWTH 5 DAYS  Final   Report Status 04/20/2016 FINAL  Final  Blood Culture (routine x 2)     Status: None   Collection Time: 04/15/16  6:40 AM  Result Value Ref Range Status   Specimen Description BLOOD RIGHT HAND  Final   Special Requests IN PEDIATRIC BOTTLE Blood Culture adequate volume  Final   Culture NO GROWTH 5 DAYS  Final   Report Status 04/20/2016 FINAL  Final  Urine culture  Status: None   Collection Time: 04/15/16  7:22 AM  Result Value Ref Range Status   Specimen Description URINE, CATHETERIZED  Final   Special Requests NONE  Final   Culture NO GROWTH  Final   Report Status 04/16/2016 FINAL  Final     Labs: CBC:  Recent Labs Lab 04/16/16 0758 04/17/16 0251 04/17/16 0836 04/17/16 0947 04/18/16 0440 04/19/16 0323  WBC 10.0 7.4  --  8.2 7.1 6.5  HGB 8.5* 7.7* 8.8* 8.9* 8.3* 8.8*  HCT 27.6* 25.1* 28.6* 28.6* 25.9* 28.5*  MCV 88.2 88.1  --  88.8 87.8 87.4  PLT 131* 142*  --  148* 159 734   Basic Metabolic Panel:  Recent Labs Lab 04/16/16 0329 04/17/16 0251 04/18/16 0440 04/19/16 0323  NA 139 141 142 144  K 3.3* 5.1 4.8 4.5  CL 109 108 109 109  CO2 25 26 27 24   GLUCOSE 100* 113* 103* 81  BUN 16 15 14 10   CREATININE 0.78 0.77  0.69 0.72  CALCIUM 7.7* 8.3* 8.5* 8.4*  MG  --  2.1  --   --    Liver Function Tests: No results for input(s): AST, ALT, ALKPHOS, BILITOT, PROT, ALBUMIN in the last 168 hours. No results for input(s): LIPASE, AMYLASE in the last 168 hours. No results for input(s): AMMONIA in the last 168 hours. Cardiac Enzymes:  Recent Labs Lab 04/15/16 1439 04/15/16 2115  TROPONINI <0.03 0.07*   BNP (last 3 results) No results for input(s): BNP in the last 8760 hours. CBG:  Recent Labs Lab 04/17/16 1132 04/17/16 1630 04/18/16 0729 04/18/16 1141 04/18/16 1616  GLUCAP 135* 88 83 129* 97   Time spent: 35 minutes  Signed:  Elmina Hendel  [facility] 04/19/2016 , 12:45 PM

## 2016-09-07 ENCOUNTER — Other Ambulatory Visit: Payer: Self-pay | Admitting: Internal Medicine

## 2016-09-07 ENCOUNTER — Ambulatory Visit
Admission: RE | Admit: 2016-09-07 | Discharge: 2016-09-07 | Disposition: A | Discharge status: Home / Self Care | Payer: Medicare Other | Source: Ambulatory Visit | Attending: Internal Medicine | Admitting: Internal Medicine

## 2016-09-07 DIAGNOSIS — R509 Fever, unspecified: Secondary | ICD-10-CM

## 2016-09-24 ENCOUNTER — Ambulatory Visit
Admission: RE | Admit: 2016-09-24 | Discharge: 2016-09-24 | Disposition: A | Discharge status: Home / Self Care | Payer: Medicare Other | Source: Ambulatory Visit | Attending: Internal Medicine | Admitting: Internal Medicine

## 2016-09-24 ENCOUNTER — Other Ambulatory Visit: Payer: Self-pay | Admitting: Internal Medicine

## 2016-09-24 DIAGNOSIS — M79674 Pain in right toe(s): Secondary | ICD-10-CM

## 2016-10-09 ENCOUNTER — Emergency Department (HOSPITAL_COMMUNITY): Payer: 59

## 2016-10-09 ENCOUNTER — Encounter (HOSPITAL_COMMUNITY): Payer: Self-pay

## 2016-10-09 ENCOUNTER — Inpatient Hospital Stay (HOSPITAL_COMMUNITY)
Admission: EM | Admit: 2016-10-09 | Discharge: 2016-10-12 | DRG: 291 | Disposition: A | Discharge status: Home / Self Care | Payer: 59 | Attending: Internal Medicine | Admitting: Internal Medicine

## 2016-10-09 DIAGNOSIS — R7989 Other specified abnormal findings of blood chemistry: Secondary | ICD-10-CM

## 2016-10-09 DIAGNOSIS — Z7982 Long term (current) use of aspirin: Secondary | ICD-10-CM

## 2016-10-09 DIAGNOSIS — R778 Other specified abnormalities of plasma proteins: Secondary | ICD-10-CM

## 2016-10-09 DIAGNOSIS — R350 Frequency of micturition: Secondary | ICD-10-CM | POA: Diagnosis present

## 2016-10-09 DIAGNOSIS — Z87891 Personal history of nicotine dependence: Secondary | ICD-10-CM | POA: Diagnosis not present

## 2016-10-09 DIAGNOSIS — J189 Pneumonia, unspecified organism: Secondary | ICD-10-CM | POA: Diagnosis present

## 2016-10-09 DIAGNOSIS — F0391 Unspecified dementia with behavioral disturbance: Secondary | ICD-10-CM | POA: Diagnosis present

## 2016-10-09 DIAGNOSIS — I69354 Hemiplegia and hemiparesis following cerebral infarction affecting left non-dominant side: Secondary | ICD-10-CM | POA: Diagnosis not present

## 2016-10-09 DIAGNOSIS — D696 Thrombocytopenia, unspecified: Secondary | ICD-10-CM | POA: Diagnosis present

## 2016-10-09 DIAGNOSIS — R748 Abnormal levels of other serum enzymes: Secondary | ICD-10-CM | POA: Diagnosis present

## 2016-10-09 DIAGNOSIS — B379 Candidiasis, unspecified: Secondary | ICD-10-CM | POA: Diagnosis present

## 2016-10-09 DIAGNOSIS — L8990 Pressure ulcer of unspecified site, unspecified stage: Secondary | ICD-10-CM | POA: Insufficient documentation

## 2016-10-09 DIAGNOSIS — Z23 Encounter for immunization: Secondary | ICD-10-CM

## 2016-10-09 DIAGNOSIS — Z66 Do not resuscitate: Secondary | ICD-10-CM | POA: Diagnosis present

## 2016-10-09 DIAGNOSIS — Z681 Body mass index (BMI) 19 or less, adult: Secondary | ICD-10-CM | POA: Diagnosis not present

## 2016-10-09 DIAGNOSIS — E785 Hyperlipidemia, unspecified: Secondary | ICD-10-CM | POA: Diagnosis present

## 2016-10-09 DIAGNOSIS — I480 Paroxysmal atrial fibrillation: Secondary | ICD-10-CM | POA: Diagnosis present

## 2016-10-09 DIAGNOSIS — R0602 Shortness of breath: Secondary | ICD-10-CM | POA: Diagnosis present

## 2016-10-09 DIAGNOSIS — E43 Unspecified severe protein-calorie malnutrition: Secondary | ICD-10-CM | POA: Diagnosis present

## 2016-10-09 DIAGNOSIS — I5023 Acute on chronic systolic (congestive) heart failure: Secondary | ICD-10-CM | POA: Diagnosis present

## 2016-10-09 DIAGNOSIS — G8194 Hemiplegia, unspecified affecting left nondominant side: Secondary | ICD-10-CM | POA: Diagnosis present

## 2016-10-09 DIAGNOSIS — I340 Nonrheumatic mitral (valve) insufficiency: Secondary | ICD-10-CM | POA: Diagnosis not present

## 2016-10-09 DIAGNOSIS — R1310 Dysphagia, unspecified: Secondary | ICD-10-CM | POA: Diagnosis present

## 2016-10-09 DIAGNOSIS — J181 Lobar pneumonia, unspecified organism: Secondary | ICD-10-CM | POA: Diagnosis not present

## 2016-10-09 DIAGNOSIS — I5043 Acute on chronic combined systolic (congestive) and diastolic (congestive) heart failure: Secondary | ICD-10-CM | POA: Diagnosis present

## 2016-10-09 DIAGNOSIS — Z79899 Other long term (current) drug therapy: Secondary | ICD-10-CM | POA: Diagnosis not present

## 2016-10-09 DIAGNOSIS — M959 Acquired deformity of musculoskeletal system, unspecified: Secondary | ICD-10-CM | POA: Diagnosis present

## 2016-10-09 DIAGNOSIS — R21 Rash and other nonspecific skin eruption: Secondary | ICD-10-CM | POA: Diagnosis present

## 2016-10-09 DIAGNOSIS — L89622 Pressure ulcer of left heel, stage 2: Secondary | ICD-10-CM | POA: Diagnosis present

## 2016-10-09 DIAGNOSIS — I110 Hypertensive heart disease with heart failure: Secondary | ICD-10-CM | POA: Diagnosis present

## 2016-10-09 DIAGNOSIS — J440 Chronic obstructive pulmonary disease with acute lower respiratory infection: Secondary | ICD-10-CM | POA: Diagnosis present

## 2016-10-09 LAB — URINALYSIS, ROUTINE W REFLEX MICROSCOPIC
BILIRUBIN URINE: NEGATIVE
Glucose, UA: NEGATIVE mg/dL
Ketones, ur: NEGATIVE mg/dL
Leukocytes, UA: NEGATIVE
Nitrite: NEGATIVE
PROTEIN: 100 mg/dL — AB
Specific Gravity, Urine: 1.019 (ref 1.005–1.030)
pH: 5 (ref 5.0–8.0)

## 2016-10-09 LAB — BASIC METABOLIC PANEL
ANION GAP: 10 (ref 5–15)
BUN: 37 mg/dL — ABNORMAL HIGH (ref 6–20)
CALCIUM: 8.6 mg/dL — AB (ref 8.9–10.3)
CHLORIDE: 110 mmol/L (ref 101–111)
CO2: 21 mmol/L — AB (ref 22–32)
Creatinine, Ser: 0.93 mg/dL (ref 0.61–1.24)
GFR calc Af Amer: >60 mL/min (ref >60)
GFR calc non Af Amer: >60 mL/min (ref >60)
GLUCOSE: 121 mg/dL — AB (ref 65–99)
POTASSIUM: 3.5 mmol/L (ref 3.5–5.1)
Sodium: 141 mmol/L (ref 135–145)

## 2016-10-09 LAB — CBC WITH DIFFERENTIAL/PLATELET
BASOS PCT: 0 %
Basophils Absolute: 0 10*3/uL (ref 0.0–0.1)
EOS PCT: 0 %
Eosinophils Absolute: 0 10*3/uL (ref 0.0–0.7)
HEMATOCRIT: 32.4 % — AB (ref 39.0–52.0)
Hemoglobin: 10.4 g/dL — ABNORMAL LOW (ref 13.0–17.0)
LYMPHS ABS: 1 10*3/uL (ref 0.7–4.0)
Lymphocytes Relative: 17 %
MCH: 29.1 pg (ref 26.0–34.0)
MCHC: 32.1 g/dL (ref 30.0–36.0)
MCV: 90.8 fL (ref 78.0–100.0)
MONO ABS: 0.5 10*3/uL (ref 0.1–1.0)
MONOS PCT: 9 %
NEUTROS ABS: 4.3 10*3/uL (ref 1.7–7.7)
Neutrophils Relative %: 74 %
PLATELETS: 37 10*3/uL — AB (ref 150–400)
RBC: 3.57 MIL/uL — AB (ref 4.22–5.81)
RDW: 14.8 % (ref 11.5–15.5)
WBC: 5.8 10*3/uL (ref 4.0–10.5)

## 2016-10-09 LAB — BRAIN NATRIURETIC PEPTIDE: B NATRIURETIC PEPTIDE 5: 2898.4 pg/mL — AB (ref 0.0–100.0)

## 2016-10-09 LAB — CBC
HEMATOCRIT: 31.9 % — AB (ref 39.0–52.0)
HEMOGLOBIN: 10.1 g/dL — AB (ref 13.0–17.0)
MCH: 29 pg (ref 26.0–34.0)
MCHC: 31.7 g/dL (ref 30.0–36.0)
MCV: 91.7 fL (ref 78.0–100.0)
Platelets: 49 10*3/uL — ABNORMAL LOW (ref 150–400)
RBC: 3.48 MIL/uL — AB (ref 4.22–5.81)
RDW: 14.9 % (ref 11.5–15.5)
WBC: 6 10*3/uL (ref 4.0–10.5)

## 2016-10-09 LAB — CREATININE, SERUM: Creatinine, Ser: 0.93 mg/dL (ref 0.61–1.24)

## 2016-10-09 LAB — MAGNESIUM: MAGNESIUM: 2 mg/dL (ref 1.7–2.4)

## 2016-10-09 LAB — I-STAT TROPONIN, ED: Troponin i, poc: 0.13 ng/mL (ref 0.00–0.08)

## 2016-10-09 LAB — TROPONIN I: TROPONIN I: 0.09 ng/mL — AB (ref <0.03)

## 2016-10-09 MED ORDER — SODIUM CHLORIDE 0.9 % IV BOLUS (SEPSIS)
500.0000 mL | Freq: Once | INTRAVENOUS | Status: AC
  Administered 2016-10-09: 500 mL via INTRAVENOUS

## 2016-10-09 MED ORDER — OXYCODONE-ACETAMINOPHEN 5-325 MG PO TABS: 0.5000 | ORAL_TABLET | Freq: Three times a day (TID) | ORAL | Status: DC | PRN

## 2016-10-09 MED ORDER — SODIUM CHLORIDE 0.9% FLUSH: 3.0000 mL | INTRAVENOUS | Status: DC | PRN

## 2016-10-09 MED ORDER — POLYETHYLENE GLYCOL 3350 17 G PO PACK
17.0000 g | PACK | Freq: Every day | ORAL | Status: DC
  Administered 2016-10-10 – 2016-10-12 (×3): 17 g via ORAL
  Filled 2016-10-09 (×4): qty 1

## 2016-10-09 MED ORDER — ACETAMINOPHEN 325 MG PO TABS: 650.0000 mg | ORAL_TABLET | ORAL | Status: DC | PRN

## 2016-10-09 MED ORDER — FUROSEMIDE 10 MG/ML IJ SOLN
30.0000 mg | Freq: Two times a day (BID) | INTRAMUSCULAR | Status: DC
  Administered 2016-10-10 – 2016-10-12 (×5): 30 mg via INTRAVENOUS
  Filled 2016-10-09 (×5): qty 4

## 2016-10-09 MED ORDER — DEXTROSE 5 % IV SOLN
500.0000 mg | Freq: Once | INTRAVENOUS | Status: AC
  Administered 2016-10-09: 500 mg via INTRAVENOUS
  Filled 2016-10-09: qty 500

## 2016-10-09 MED ORDER — POTASSIUM CHLORIDE CRYS ER 20 MEQ PO TBCR
20.0000 meq | EXTENDED_RELEASE_TABLET | Freq: Two times a day (BID) | ORAL | Status: DC
  Administered 2016-10-10 – 2016-10-12 (×6): 20 meq via ORAL
  Filled 2016-10-09 (×6): qty 1

## 2016-10-09 MED ORDER — POTASSIUM CHLORIDE 20 MEQ/15ML (10%) PO SOLN: 20.0000 meq | Freq: Every day | ORAL | Status: DC | PRN

## 2016-10-09 MED ORDER — ATORVASTATIN CALCIUM 40 MG PO TABS
40.0000 mg | ORAL_TABLET | Freq: Every day | ORAL | Status: DC
  Administered 2016-10-10 – 2016-10-11 (×3): 40 mg via ORAL
  Filled 2016-10-09 (×3): qty 1

## 2016-10-09 MED ORDER — CARVEDILOL 3.125 MG PO TABS
3.1250 mg | ORAL_TABLET | Freq: Two times a day (BID) | ORAL | Status: DC
  Administered 2016-10-10 – 2016-10-12 (×6): 3.125 mg via ORAL
  Filled 2016-10-09 (×7): qty 1

## 2016-10-09 MED ORDER — DONEPEZIL HCL 10 MG PO TABS
10.0000 mg | ORAL_TABLET | Freq: Every day | ORAL | Status: DC
  Administered 2016-10-09 – 2016-10-11 (×3): 10 mg via ORAL
  Filled 2016-10-09 (×3): qty 1

## 2016-10-09 MED ORDER — SODIUM CHLORIDE 0.9% FLUSH
3.0000 mL | Freq: Two times a day (BID) | INTRAVENOUS | Status: DC
  Administered 2016-10-10 – 2016-10-12 (×4): 3 mL via INTRAVENOUS

## 2016-10-09 MED ORDER — HEPARIN SODIUM (PORCINE) 5000 UNIT/ML IJ SOLN: 5000.0000 [IU] | Freq: Three times a day (TID) | INTRAMUSCULAR | Status: DC

## 2016-10-09 MED ORDER — IPRATROPIUM-ALBUTEROL 0.5-2.5 (3) MG/3ML IN SOLN: 3.0000 mL | Freq: Four times a day (QID) | RESPIRATORY_TRACT | Status: DC | PRN

## 2016-10-09 MED ORDER — DEXTROSE 5 % IV SOLN
1.0000 g | Freq: Once | INTRAVENOUS | Status: AC
  Administered 2016-10-09: 1 g via INTRAVENOUS
  Filled 2016-10-09: qty 10

## 2016-10-09 MED ORDER — AZITHROMYCIN 250 MG PO TABS: 500.0000 mg | ORAL_TABLET | Freq: Once | ORAL | Status: DC

## 2016-10-09 MED ORDER — QUETIAPINE FUMARATE 25 MG PO TABS
12.5000 mg | ORAL_TABLET | Freq: Every evening | ORAL | Status: DC | PRN
  Filled 2016-10-09: qty 1

## 2016-10-09 MED ORDER — FUROSEMIDE 10 MG/ML IJ SOLN
30.0000 mg | Freq: Once | INTRAMUSCULAR | Status: AC
  Administered 2016-10-09: 30 mg via INTRAVENOUS
  Filled 2016-10-09: qty 4

## 2016-10-09 MED ORDER — ASPIRIN EC 81 MG PO TBEC
81.0000 mg | DELAYED_RELEASE_TABLET | Freq: Every day | ORAL | Status: DC
  Administered 2016-10-10 – 2016-10-12 (×4): 81 mg via ORAL
  Filled 2016-10-09 (×4): qty 1

## 2016-10-09 MED ORDER — INFLUENZA VAC SPLIT HIGH-DOSE 0.5 ML IM SUSY
0.5000 mL | PREFILLED_SYRINGE | INTRAMUSCULAR | Status: AC
  Administered 2016-10-11: 0.5 mL via INTRAMUSCULAR
  Filled 2016-10-09: qty 0.5

## 2016-10-09 MED ORDER — ADULT MULTIVITAMIN W/MINERALS CH
1.0000 | ORAL_TABLET | Freq: Every day | ORAL | Status: DC
  Administered 2016-10-10 – 2016-10-12 (×4): 1 via ORAL
  Filled 2016-10-09 (×4): qty 1

## 2016-10-09 MED ORDER — DOCUSATE SODIUM 100 MG PO CAPS
100.0000 mg | ORAL_CAPSULE | ORAL | Status: DC
  Administered 2016-10-10 – 2016-10-12 (×2): 100 mg via ORAL
  Filled 2016-10-09 (×2): qty 1

## 2016-10-09 MED ORDER — TRAZODONE HCL 50 MG PO TABS
50.0000 mg | ORAL_TABLET | Freq: Every evening | ORAL | Status: DC | PRN
  Administered 2016-10-10 – 2016-10-12 (×2): 50 mg via ORAL
  Filled 2016-10-09 (×2): qty 1

## 2016-10-09 MED ORDER — GUAIFENESIN ER 600 MG PO TB12
600.0000 mg | ORAL_TABLET | Freq: Two times a day (BID) | ORAL | Status: DC
  Administered 2016-10-10 – 2016-10-12 (×6): 600 mg via ORAL
  Filled 2016-10-09 (×6): qty 1

## 2016-10-09 MED ORDER — SODIUM CHLORIDE 0.9 % IV SOLN: 250.0000 mL | INTRAVENOUS | Status: DC | PRN

## 2016-10-09 MED ORDER — ENSURE ENLIVE PO LIQD
237.0000 mL | Freq: Two times a day (BID) | ORAL | Status: DC
  Administered 2016-10-10 – 2016-10-12 (×5): 237 mL via ORAL

## 2016-10-09 MED ORDER — ONDANSETRON HCL 4 MG/2ML IJ SOLN
4.0000 mg | Freq: Four times a day (QID) | INTRAMUSCULAR | Status: DC | PRN
Start: 2016-10-09 — End: 2016-10-12

## 2016-10-09 MED ORDER — LOSARTAN POTASSIUM 25 MG PO TABS
12.5000 mg | ORAL_TABLET | Freq: Every day | ORAL | Status: DC
  Administered 2016-10-10 – 2016-10-12 (×4): 12.5 mg via ORAL
  Filled 2016-10-09 (×4): qty 1

## 2016-10-09 MED ORDER — AMOXICILLIN-POT CLAVULANATE 400-57 MG/5ML PO SUSR
400.0000 mg | Freq: Two times a day (BID) | ORAL | Status: DC
  Administered 2016-10-10 – 2016-10-12 (×6): 400 mg via ORAL
  Filled 2016-10-09 (×10): qty 5

## 2016-10-09 MED ORDER — ZINC OXIDE 40 % EX OINT
TOPICAL_OINTMENT | Freq: Once | CUTANEOUS | Status: AC
  Administered 2016-10-09: 11:00:00 via TOPICAL
  Filled 2016-10-09: qty 57

## 2016-10-09 NOTE — ED Notes (Signed)
Bed: TM22 Expected date:  Expected time:  Means of arrival:  Comments: 83 yo AMS

## 2016-10-09 NOTE — H&P (Signed)
History and Physical    [name]:[id number] DOB: 1926/02/07 DOA: 10/09/2016  PCP: Seward Carol, MD   I have briefly reviewed patients previous medical reports in [facility].  Patient coming from: home  Chief Complaint: shortness of breath, productive cough, skin breakdown.  HPI: Aaron Johnson is a 83 year old male with a past medical history significant for hypertension, hyperlipidemia, paroxysmal atrial fibrillation (not on anticoagulation), history of dementia with behavioral disturbances and history of ischemic stroke with left hemiparesis; who presented to the major department secondary to increased shortness of breath and productive cough. Per family members patient's symptoms have been present for the last 3-4 days and worsening. There has not been any hemoptysis or fever. He has also noticed increased swelling of his lower extremities. No nausea, no vomiting,no dysuria or hematuria, no abdominal pain, no chest pain, no headaches or any new focal deficit. Of note patient has had increased urinary frequency and develop candida infection on his scrotal skin with positive breakdown.  ED Course: chest x-ray demonstrating infiltrates and vascular congestion; patient with increased respiratory rate, per family member with productive cough and increase shortness of breath. Patient received Rocephin and Zithromax empirically for pneumonia and was initiated on Lasix for superimposed CHF exacerbation with vascular congestion and interstitial edema.  Review of Systems:  All other systems reviewed and apart from HPI, are negative.  Past Medical History:  Diagnosis Date  . Acquired musculoskeletal deformity of unspecified site   . Agitation    sun downing  . 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 Aortic valve insufficiency    artifical pig valve  . Benign neoplasm of colon   . Blind    completely  . CHF (congestive heart failure) ([place])   . Chronic diastolic heart  failure (HCC)    EF >= 50%  . COPD (chronic obstructive pulmonary disease) ([place])   . Hemiparesis, left ([place])   . HTN (hypertension), benign    systematic  . Iron deficiency anemia, unspecified   . LFTs abnormal 3/06   LFTs wnl, Cr - 1.3 stable. 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 colonoscopy 3/05- adenomatous polyps, mild gastritis-diffuse interosseous skeletal hypertosis (DISH), h/o H. pylori s/p tx.  . Pneumonia 04/2016  . Pure hypercholesterolemia   . Stroke [facility])     Past Surgical History:  Procedure Laterality Date  . AORTIC VALVE REPLACEMENT     with pericardial tissue valve  . CARDIAC CATHETERIZATION  1/99   mild CAD; EF 35-40%  . CARDIAC VALVE REPLACEMENT    . MULTIPLE TOOTH EXTRACTIONS      Social History  reports that he has quit smoking. His smoking use included Cigarettes. He has never used smokeless tobacco. He reports that he does not drink alcohol or use drugs.  No Known Allergies  Family History  Problem Relation Age of Onset  . Heart disease Father   . Colon cancer Neg Hx      Prior to Admission medications   Medication Sig Start Date End Date Taking? Authorizing Provider  aspirin 81 MG tablet Take 1 tablet (81 mg total) by mouth daily. Resume after 7 days from 11/26/13 11/26/13  Yes Ghimire, Henreitta Leber, MD  atorvastatin (LIPITOR) 40 MG tablet Take 40 mg by mouth at bedtime.    Yes [provider]  docusate sodium (COLACE) 100 MG capsule Take 100 mg  by mouth every other day. Rotate with Senna-Docusate tablet   Yes [provider]  donepezil (ARICEPT) 10 MG tablet Take 1 tablet by mouth at bedtime. 08/24/14  Yes [provider]  furosemide (LASIX) 20 MG tablet Take 30 mg by mouth daily as needed for fluid or edema. For edema   Yes [provider]  guaiFENesin (MUCINEX) 600 MG 12 hr tablet Take 1 tablet (600 mg  total) by mouth 2 (two) times daily. Patient taking differently: Take 600 mg by mouth 2 (two) times daily as needed for cough or to loosen phlegm.  04/19/16  Yes Lavina Hamman, MD  losartan (COZAAR) 25 MG tablet Take 25 mg by mouth daily.   Yes [provider]  Multiple Vitamin (MULTIVITAMIN) tablet Take 1 tablet by mouth daily.     Yes [provider]  nystatin cream (MYCOSTATIN) Apply 1 application topically 2 (two) times daily. 10/04/16  Yes [provider]  oxyCODONE-acetaminophen (PERCOCET) 5-325 MG per tablet Take 0.5-1 tablets by mouth every 4 (four) hours as needed for moderate pain.    Yes [provider]  polyethylene glycol (MIRALAX / GLYCOLAX) packet Take 17 g by mouth daily.   Yes [provider]  potassium chloride (KLOR-CON) 20 MEQ packet Take 20 mEq by mouth daily as needed (when taking Lasix).    Yes [provider]  QUEtiapine (SEROQUEL) 25 MG tablet Take 12.5-25 mg by mouth at bedtime as needed (agitation). Depending on severity of sundowning    Yes [provider]  senna-docusate (SENOKOT-S) 8.6-50 MG tablet Take 1 tablet by mouth at bedtime. Patient taking differently: Take 1 tablet by mouth every other day. Rotate with docusate 100mg  capsule 04/19/16  Yes Lavina Hamman, MD  traZODone (DESYREL) 100 MG tablet Take 50-100 mg by mouth at bedtime as needed for sleep.  01/24/14  Yes [provider]    Physical Exam: Vitals:   10/09/16 1304 10/09/16 1400 10/09/16 1415 10/09/16 1526  BP:  109/63  128/71  Pulse: 72  72 74  Resp: 19 11 19 15   Temp:    98.8 F (37.1 C)  TempSrc:    Oral  SpO2: 97%  99% 98%  Weight:    53.6 kg (118 lb 2.7 oz)  Height:    5\' 5"  (1.651 m)    Constitutional: no complaining of chest pain, some difficulty following commands secondary to underlying dementia, afebrile. Patient was signs of fluid overload Eyes: PERTLA, lids and conjunctivae normal, no icterus, no nystagmus ENMT:  Mucous membranes are moist. No thrush, no erythema or exudates in his throat. No teeth or dentures in place. Neck: supple, no masses, no thyromegaly Respiratory: fair movement, by basilar crackles on auscultation, no using accessory muscles. Cardiovascular: ate controlled, mild JVD, positive systolic ejection murmur, no rubs, no gallops.  Abdomen: No distension, no tenderness, no masses palpated. No hepatosplenomegaly. Bowel sounds normal.  Musculoskeletal: 2-3+ + bilateral lower extremity edema, no cyanosis, left-sided hemiparesis and left upper extremity contracture appreciated on exam. Skin: patient with moisture skin changes in his scrotum; no drainage or signs of superimposed infection. Neurologic: left-sided hemiparesis with appreciated contracture on his left arm.no other focal neurologic deficit appreciated. Psychiatric: alert, awake and essentially oriented 1.Some difficulty following simple commands no agitations.    Labs on Admission: I have personally reviewed following labs and imaging studies  CBC:  Recent Labs Lab 10/09/16 1113  WBC 5.8  NEUTROABS 4.3  HGB 10.4*  HCT 32.4*  MCV  90.8  PLT 37*   Basic Metabolic Panel:  Recent Labs Lab 10/09/16 1113  NA 141  K 3.5  CL 110  CO2 21*  GLUCOSE 121*  BUN 37*  CREATININE 0.93  CALCIUM 8.6*   Urine analysis:    Component Value Date/Time   COLORURINE YELLOW 10/09/2016 1127   APPEARANCEUR HAZY (A) 10/09/2016 1127   LABSPEC 1.019 10/09/2016 1127   PHURINE 5.0 10/09/2016 1127   GLUCOSEU NEGATIVE 10/09/2016 1127   HGBUR MODERATE (A) 10/09/2016 1127   BILIRUBINUR NEGATIVE 10/09/2016 1127   KETONESUR NEGATIVE 10/09/2016 1127   PROTEINUR 100 (A) 10/09/2016 1127   UROBILINOGEN 1.0 11/15/2014 1802   NITRITE NEGATIVE 10/09/2016 1127   LEUKOCYTESUR NEGATIVE 10/09/2016 1127    Radiological Exams on Admission: Dg Chest 2 View  Result Date: 10/09/2016 CLINICAL DATA:  Shortness of breath over the last 2 days.   Wheezing. EXAM: CHEST  2 VIEW COMPARISON:  09/07/2016 FINDINGS: Previous median sternotomy and aortic valve replacement. Cardiomegaly with left ventricular prominence. Aortic atherosclerosis. Development of bilateral effusions with dependent atelectasis. Mild interstitial edema. Findings probably secondary to heart failure, but basilar pneumonia is not excluded. No acute bone finding. IMPRESSION: Radiographic worsening with development of bilateral effusions and bilateral lower lobe atelectasis and/or pneumonia. Mild interstitial edema. Electronically Signed   By: Nelson Chimes M.D.   On: 10/09/2016 11:07    EKG: Rate control, atrial fibrillation appreciated, coupled PVCs and flutter wave progression.  Assessment/Plan 1-SOB (shortness of breath): appears to be a combination of pneumonia and CHF exacerbation. -patient with a prior history of stroke, high risk for aspiration as he also have underlying cognitive impairment from dementia. But per family member at bedside no frank signs of aspiration while he has been fed. -Since the patient is currently not febrile and his white blood cells are not elevated; and also his physical exam and BNP are more suggestive of CHF exacerbation will treat with oral antibiotics(Augmentin). -Patient will had daily weights, strict intake and output, heart healthy diet and will treat with IV Lasix. -Will check 2-D echo and repeat EKG in the morning. -monitoring on telemetry and will follow closely electrolytes and renal function. -Last echo in 2015 demonstrated ejection fraction of 30-35%.  2-hx of ischemic stroke with residual Left hemiparesis ([place]): -Will continue aspirin and statins for secondary prevention - no new focal deficit appreciated  3-Paroxysmal atrial fibrillation (HCC) -currently sinus rhythm and rate control -Low dose Coreg has been added as part of the treatment for his heart failure, this will also provide protection for further rate  control. -Continue the use of aspirin -CHADsVASC score >3; but not a candidate for anticoagulation therapy  4-Protein-calorie malnutrition, severe -nutritional consult has been consulted -Will use Ensure Plus a day as feeding supplements  5-acute on chronic systolic heart failure -Treatment as mentioned above -Daily weights, strict intake and output, IV Lasix -Will check 2-D echo -Continue low dose ARB and will also add low-dose of carvedilol as bite of the treatment. -last echo in 2015 with ejection fraction of 30-35%.  6-Elevated troponin -no chest pain -Just mild elevation of his troponin; will cycle trend -appears to be secondary to demand ischemia from CHF exacerbation. -Continue aspirin and low-dose beta blocker.  7-dysphagia -7 residual side effects from ischemic stroke -Will continue dysphagia 2 diet with thin liquids.  8-dementia with behavioral disturbances -Will continue Aricept and Seroquel -Currently patient without any agitations.  9-HLD -will continue statins  10-thrombocytopenia -new; most likely error vs clumping  -  will avoid heparin products overnight -repeat CBC in am -use SCD's -no signs of acute bleeding  Time: 70 minutes  DVT prophylaxis: SCD's Code Status: DO NOT RESUSCITATE Family Communication: daughter at bedside Disposition Plan: to be determined. Most likely discharge home once medically stable (potentially with follow-up from palliative care/hospice) Consults called: none Admission status: inpatient, telemetry, LOS > 2 midnights.   Barton Dubois MD [facility] Pager [phone]  If 7PM-7AM, please contact night-coverage [email or web address] Password [facility]  10/09/2016, 6:44 PM

## 2016-10-09 NOTE — ED Provider Notes (Signed)
[facility] DEPT Provider Note   CSN: [id number] Arrival date & time: 10/09/16  5643     History   Chief Complaint Chief Complaint  Patient presents with  . Cough  . Rash    HPI Aaron Johnson is a 83 y.o. male.  HPI Patient presents to the emergency room for evaluation of several issues. The patient has a history of dementia. He resides at home with his family members. They have to care for him and help him with his daily activities including bathing feeding. The patient does not walk much anymore and has to be assisted to transfer.  Last night the daughter noticed that he started coughing more again. He seemed to have mucus and congestion. He has not had any fevers. No vomiting or diarrhea.  This morning the daughter also noticed breakdown of the skin in his scrotum. He was recently diagnosed with a yeast infection. He had been taking nystatin and the rash had pretty much resolved. When she was changing him this morning is complaining of pain in the scrotal area and she noticed breakdown of the skin with some blood in his diaper.  He has a history of trouble with constipation but none recently. She continues to give him stool softeners.  Daughter states the patient seems to be at his baseline but a little bit more sleepy today Past Medical History:  Diagnosis Date  . Acquired musculoskeletal deformity of unspecified site   . Agitation    sun downing  . 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 Aortic valve insufficiency    artifical pig valve  . Benign neoplasm of colon   . Blind    completely  . CHF (congestive heart failure) ([place])   . Chronic diastolic heart failure (HCC)    EF >= 50%  . COPD (chronic obstructive pulmonary disease) ([place])   . Hemiparesis, left ([place])   . HTN (hypertension), benign    systematic  . Iron deficiency anemia, unspecified   . LFTs abnormal 3/06   LFTs wnl, Cr - 1.3 stable. 4/06 likely TIA; pt may also have DISH syndrome ( thick c  vertebrae), ataxic gait 2/2 to L hemiparesis, bx of scalp lesions=blue nevus (benign), 6/04. colonoscopy 3/05- adenomatous polyps, mild gastritis-diffuse interosseous skeletal hypertosis (DISH), h/o H. pylori s/p tx.  . Pneumonia 04/2016  . Pure hypercholesterolemia   . Stroke [facility])     Patient Active Problem List   Diagnosis Date Noted  . Protein-calorie malnutrition, severe 04/16/2016  . SIRS (systemic inflammatory response syndrome) ([place]) 04/15/2016  . Blind 12/25/2013  . Debilitated patient 12/25/2013  . Convulsion ([place]) 12/07/2013  . Paroxysmal atrial fibrillation ([place]) 12/07/2013  . Intracranial hemorrhage ([place]) 11/24/2013  . Fall 11/24/2013  . Dementia 11/24/2013  . Cardiomyopathy, dilated ([place]) 10/11/2013  . TIA (transient ischemic attack) 08/02/2013  . Fever 02/06/2013  . PNA (pneumonia) 02/06/2013  . Left hemiparesis ([place]) 10/13/2012  . Hemiparesis, left ([place])   . Chest pain 06/23/2011  . PERSONAL HISTORY OF COLONIC POLYPS 07/11/2009  . HLD (hyperlipidemia) 06/06/2008  . S/P AVR 04/03/2008  . COLON POLYP 03/03/2006  . HYPERCHOLESTEROLEMIA 03/03/2006  . ANEMIA, IRON DEFICIENCY, UNSPEC. 03/03/2006  . HYPERTENSION, BENIGN SYSTEMIC 03/03/2006  . CORONARY, ARTERIOSCLEROSIS 03/03/2006  . Moderate aortic regurgitation 03/03/2006  . CHF, EJECTION FRACTION > OR = 50% 03/03/2006  . COPD (chronic obstructive pulmonary disease) ([place]) 03/03/2006  . CEREBROVAS DIS, LATE EFFECTS (S/P CVA) 03/03/2006  . ANGIOEDEMA 03/03/2006    Past Surgical  History:  Procedure Laterality Date  . AORTIC VALVE REPLACEMENT     with pericardial tissue valve  . CARDIAC CATHETERIZATION  1/99   mild CAD; EF 35-40%  . CARDIAC VALVE REPLACEMENT    . MULTIPLE TOOTH EXTRACTIONS         Home Medications    Prior to Admission medications   Medication Sig Start Date End Date Taking? Authorizing Provider  aspirin 81 MG tablet Take 1 tablet (81 mg total) by mouth daily. Resume after 7 days from  11/26/13 11/26/13  Yes Ghimire, Henreitta Leber, MD  atorvastatin (LIPITOR) 40 MG tablet Take 40 mg by mouth at bedtime.    Yes [provider]  docusate sodium (COLACE) 100 MG capsule Take 100 mg by mouth every other day. Rotate with Senna-Docusate tablet   Yes [provider]  donepezil (ARICEPT) 10 MG tablet Take 1 tablet by mouth at bedtime. 08/24/14  Yes [provider]  furosemide (LASIX) 20 MG tablet Take 30 mg by mouth daily as needed for fluid or edema. For edema   Yes [provider]  guaiFENesin (MUCINEX) 600 MG 12 hr tablet Take 1 tablet (600 mg total) by mouth 2 (two) times daily. Patient taking differently: Take 600 mg by mouth 2 (two) times daily as needed for cough or to loosen phlegm.  04/19/16  Yes Lavina Hamman, MD  losartan (COZAAR) 25 MG tablet Take 25 mg by mouth daily.   Yes [provider]  Multiple Vitamin (MULTIVITAMIN) tablet Take 1 tablet by mouth daily.     Yes [provider]  nystatin cream (MYCOSTATIN) Apply 1 application topically 2 (two) times daily. 10/04/16  Yes [provider]  oxyCODONE-acetaminophen (PERCOCET) 5-325 MG per tablet Take 0.5-1 tablets by mouth every 4 (four) hours as needed for moderate pain.    Yes [provider]  polyethylene glycol (MIRALAX / GLYCOLAX) packet Take 17 g by mouth daily.   Yes [provider]  potassium chloride (KLOR-CON) 20 MEQ packet Take 20 mEq by mouth daily as needed (when taking Lasix).    Yes [provider]  QUEtiapine (SEROQUEL) 25 MG tablet Take 12.5-25 mg by mouth at bedtime as needed (agitation). Depending on severity of sundowning    Yes [provider]  senna-docusate (SENOKOT-S) 8.6-50 MG tablet Take 1 tablet by mouth at bedtime. Patient taking differently: Take 1 tablet by mouth every other day. Rotate with docusate 100mg  capsule 04/19/16  Yes Lavina Hamman, MD  traZODone (DESYREL) 100 MG tablet Take 50-100 mg by mouth at  bedtime as needed for sleep.  01/24/14  Yes [provider]    Family History Family History  Problem Relation Age of Onset  . Heart disease Father   . Colon cancer Neg Hx     Social History Social History  Substance Use Topics  . Smoking status: Former Smoker    Types: Cigarettes  . Smokeless tobacco: Never Used     Comment: 10/13/2012 "smoked a little bit a long time ago"  . Alcohol use No     Comment: 10/13/2012 "haven't had anything to drink for years"     Allergies   Patient has no known allergies.   Review of Systems Review of Systems  Constitutional: Negative for fever.  All other systems reviewed and are negative.    Physical Exam Updated Vital Signs BP 105/61   Pulse 72   Temp (!) 97.4 F (36.3 C) (Rectal)   Resp 19  Ht 1.651 m (5\' 5" )   Wt 49.9 kg (110 lb)   SpO2 97%   BMI 18.30 kg/m   Physical Exam  Constitutional: He appears well-developed. He appears listless. No distress.  Elderly, frail   HENT:  Head: Normocephalic and atraumatic.  Right Ear: External ear normal.  Left Ear: External ear normal.  Mm dry   Eyes: Conjunctivae are normal. Right eye exhibits no discharge. Left eye exhibits no discharge. No scleral icterus.  Neck: Neck supple. No tracheal deviation present.  Cardiovascular: Normal rate, regular rhythm and intact distal pulses.   Murmur (systolic) heard. Pulmonary/Chest: Effort normal and breath sounds normal. No stridor. No respiratory distress. He has no wheezes. He has no rales.  Abdominal: Soft. Bowel sounds are normal. He exhibits no distension. There is no tenderness. There is no rebound and no guarding.  Musculoskeletal: He exhibits no edema or tenderness.  Clean adult diaper, scrotal area moist, breakdown of the skin, no surrounding erythema or purulent dischrage  Neurological: He appears listless. No cranial nerve deficit (left  facial droop, ) or sensory deficit. He exhibits normal muscle tone. He displays no  seizure activity. Coordination normal.  General weakness, left sided hemiparesis, able to move right foot and grip my hand with right hand (normal per patient family, hx of left hemiparesis)  Skin: Skin is warm and dry. No rash noted.  Psychiatric: He has a normal mood and affect.  Nursing note and vitals reviewed.    ED Treatments / Results  Labs (all labs ordered are listed, but only abnormal results are displayed) Labs Reviewed  CBC WITH DIFFERENTIAL/PLATELET - Abnormal; Notable for the following:       Result Value   RBC 3.57 (*)    Hemoglobin 10.4 (*)    HCT 32.4 (*)    Platelets 37 (*)    All other components within normal limits  BASIC METABOLIC PANEL - Abnormal; Notable for the following:    CO2 21 (*)    Glucose, Bld 121 (*)    BUN 37 (*)    Calcium 8.6 (*)    All other components within normal limits  URINALYSIS, ROUTINE W REFLEX MICROSCOPIC - Abnormal; Notable for the following:    APPearance HAZY (*)    Hgb urine dipstick MODERATE (*)    Protein, ur 100 (*)    Bacteria, UA RARE (*)    Squamous Epithelial / LPF 0-5 (*)    All other components within normal limits  I-STAT TROPONIN, ED - Abnormal; Notable for the following:    Troponin i, poc 0.13 (*)    All other components within normal limits  BRAIN NATRIURETIC PEPTIDE    EKG  EKG Interpretation  Date/Time:  Saturday October 09 2016 13:03:18 EDT Ventricular Rate:  72 PR Interval:    QRS Duration: 116 QT Interval:  453 QTC Calculation: 496 R Axis:   60 Text Interpretation:  Atrial fibrillation Ventricular premature complex LVH with secondary repolarization abnormality Anterior Q waves, possibly due to LVH Baseline wander in lead(s) II III aVF lateral t wave changes, new since last tracing Confirmed by Dorie Rank [phone]) on 10/09/2016 1:10:25 PM       Radiology Dg Chest 2 View  Result Date: 10/09/2016 CLINICAL DATA:  Shortness of breath over the last 2 days.  Wheezing. EXAM: CHEST  2 VIEW COMPARISON:   09/07/2016 FINDINGS: Previous median sternotomy and aortic valve replacement. Cardiomegaly with left ventricular prominence. Aortic atherosclerosis. Development of bilateral effusions with dependent atelectasis. Mild  interstitial edema. Findings probably secondary to heart failure, but basilar pneumonia is not excluded. No acute bone finding. IMPRESSION: Radiographic worsening with development of bilateral effusions and bilateral lower lobe atelectasis and/or pneumonia. Mild interstitial edema. Electronically Signed   By: Nelson Chimes M.D.   On: 10/09/2016 11:07    Procedures Procedures (including critical care time)  Medications Ordered in ED Medications  cefTRIAXone (ROCEPHIN) 1 g in dextrose 5 % 50 mL IVPB (1 g Intravenous New Bag/Given 10/09/16 1340)  azithromycin (ZITHROMAX) 500 mg in dextrose 5 % 250 mL IVPB (not administered)  sodium chloride 0.9 % bolus 500 mL (0 mLs Intravenous Stopped 10/09/16 1241)  liver oil-zinc oxide (DESITIN) 40 % ointment ( Topical Given 10/09/16 1119)     Initial Impression / Assessment and Plan / ED Course  I have reviewed the triage vital signs and the nursing notes.  Pertinent labs & imaging results that were available during my care of the patient were reviewed by me and considered in my medical decision making (see chart for details).   patient presents to emergency room with general malaise, cough, congestion and a rash in his scrotum. Patient's laboratory tests and x-rays are notable for a slight elevation in his troponin. I suspect this is more likely related to cardiac strain and less likely acute cardiac ischemia. We will need to continue to monitor his troponins. She is not having any active chest pain although his mental status does make this difficult to follow. Chest x-ray suggests possibility of either heart failure or pneumonia. I started patient on antibiotics. I ordered a BNP. I will consult the medical service for further treatment. Family agrees  with this plan.  Final Clinical Impressions(s) / ED Diagnoses   Final diagnoses:  Community acquired pneumonia, unspecified laterality  Elevated troponin      Dorie Rank, MD 10/09/16 1342

## 2016-10-09 NOTE — ED Notes (Signed)
Report Called to [facility]

## 2016-10-09 NOTE — Progress Notes (Signed)
CRITICAL VALUE ALERT  Critical Value: TROP 0.09  Date & Time Notied: 10/09/2016 2040  Provider Notified: OPYD  Orders Received/Actions taken:

## 2016-10-09 NOTE — ED Triage Notes (Signed)
Per EMS, pt home.  Pt states pt is Short of Breath with AMS.  Started last night.  Baseline 96% ra.  Hx of dementia.  A/O x 1.  Recently finished antibiotics 4-5 days ago for UTI.  IV 22g Rt Hand.  Vitals:  124/60, hr 77, resp 16, 96% ra, cbg 165

## 2016-10-09 NOTE — ED Notes (Signed)
CALL REPORT SALLIE [phone] @ 14:40

## 2016-10-10 ENCOUNTER — Inpatient Hospital Stay (HOSPITAL_COMMUNITY): Payer: 59

## 2016-10-10 DIAGNOSIS — I340 Nonrheumatic mitral (valve) insufficiency: Secondary | ICD-10-CM

## 2016-10-10 DIAGNOSIS — L8990 Pressure ulcer of unspecified site, unspecified stage: Secondary | ICD-10-CM | POA: Insufficient documentation

## 2016-10-10 DIAGNOSIS — L89622 Pressure ulcer of left heel, stage 2: Secondary | ICD-10-CM

## 2016-10-10 LAB — BASIC METABOLIC PANEL
Anion gap: 9 (ref 5–15)
BUN: 35 mg/dL — AB (ref 6–20)
CALCIUM: 8.5 mg/dL — AB (ref 8.9–10.3)
CO2: 22 mmol/L (ref 22–32)
CREATININE: 0.91 mg/dL (ref 0.61–1.24)
Chloride: 111 mmol/L (ref 101–111)
GFR calc Af Amer: >60 mL/min (ref >60)
GLUCOSE: 149 mg/dL — AB (ref 65–99)
Potassium: 4 mmol/L (ref 3.5–5.1)
Sodium: 142 mmol/L (ref 135–145)

## 2016-10-10 LAB — CBC
HEMATOCRIT: 29.5 % — AB (ref 39.0–52.0)
HEMOGLOBIN: 9.6 g/dL — AB (ref 13.0–17.0)
MCH: 29.4 pg (ref 26.0–34.0)
MCHC: 32.5 g/dL (ref 30.0–36.0)
MCV: 90.5 fL (ref 78.0–100.0)
Platelets: 50 10*3/uL — ABNORMAL LOW (ref 150–400)
RBC: 3.26 MIL/uL — AB (ref 4.22–5.81)
RDW: 14.8 % (ref 11.5–15.5)
WBC: 6.3 10*3/uL (ref 4.0–10.5)

## 2016-10-10 LAB — TROPONIN I
TROPONIN I: 0.09 ng/mL — AB (ref <0.03)
Troponin I: 0.09 ng/mL (ref <0.03)

## 2016-10-10 LAB — ECHOCARDIOGRAM COMPLETE
Height: 65 in
Weight: 1890.66 oz

## 2016-10-10 NOTE — Progress Notes (Signed)
  Echocardiogram 2D Echocardiogram has been performed.  Aaron Johnson L Androw 10/10/2016, 1:26 PM

## 2016-10-10 NOTE — Plan of Care (Signed)
Problem: Safety: Goal: Ability to remain free from injury will improve Outcome: Progressing Monitored pt for fall when sitter unavailable every hour.

## 2016-10-10 NOTE — Progress Notes (Addendum)
[facility] PROGRESS NOTE  Aaron Johnson ZOX:[id number] DOB: 1926/08/13 DOA: 10/09/2016 PCP: Seward Carol, MD  Interim summary and HPI 83 year old male with a past medical history significant for hypertension, hyperlipidemia, paroxysmal atrial fibrillation (not on anticoagulation), history of dementia with behavioral disturbances and history of ischemic stroke with left hemiparesis; who presented to the major department secondary to increased shortness of breath and productive cough. Per family members patient's symptoms have been present for the last 3-4 days and worsening. There has not been any hemoptysis or fever. He has also noticed increased swelling of his lower extremities. No nausea, no vomiting,no dysuria or hematuria, no abdominal pain, no chest pain, no headaches or any new focal deficit. Of note patient has had increased urinary frequency and develop candida infection on his scrotal skin with positive breakdown.  Assessment/Plan: 1-SOB (shortness of breath): appears to be a combination of pneumonia and CHF exacerbation. -patient continue to SOB and with wet coughing spell. -will continue treatment with augmentin suspension for PNA/bronchitic changes -will also continue IV lasix -strict I's and O's -daily weights and strict intake/output -heart healthy diet -continue to monitor on telemetry, follow electrolytes and renal function   2-hx of ischemic stroke with residual Left hemiparesis ([place]): -Will continue aspirin and statins for secondary prevention - no new focal deficit appreciated -continue supportive care and assistance.  3-Paroxysmal atrial fibrillation (HCC) -currently sinus rhythm and rate control -Low dose Coreg has been added as part of the treatment for his heart failure, this will also provide protection for further rate control. -Continue the use of aspirin -CHADsVASC score >3; but not a candidate for anticoagulation therapy  4-Protein-calorie  malnutrition, severe -nutritional consult has been consulted -Will continue Ensure Plus as feeding supplement -encourage to follow proper PO intake and adequate hydration.  5-acute on chronic systolic heart failure -Treatment as mentioned above -Daily weights, strict intake and output, continue IV Lasix - 2-D echo demonstrating EF of 20-25%, diffuse hypokinesis  -Continue low dose ARB and low-dose of carvedilol  -follow clinical response -still SOB and with signs of fluid overload.  6-Elevated troponin -remains CP free -Just mild flat elevation; remained in 0.09 range -appears to be secondary to demand ischemia from CHF exacerbation. -Continue aspirin, statins and low-dose beta blocker.  7-dysphagia -continue dysphagia 2 diet with thin liquids -appears to be residual side effects from ischemic stroke  8-dementia with behavioral disturbances -Will continue Aricept and Seroquel -stable and w/o agitations -continue supportive care  9-HLD -continue statins   10-thrombocytopenia -will avoid heparin products overnight -repeat CBC in am -use SCD's for prophylaxis -no signs of acute bleeding -Platelets in 50K range   11-stage 2 pressure injury and scrotal skin breakdown. -left heel, present on admission -will use preventive measures  Code Status: DNR Family Communication: daughter at bedside  Disposition Plan: remains inpatient, will continue oral augmentin and IV diuretics. Follow daily weights and continue supportive care.   Consultants:  None   Procedures:  2-D echo - Left ventricle: The cavity size was normal. There was mild   concentric hypertrophy. Systolic function was severely reduced.   The estimated ejection fraction was in the range of 20% to 25%.   Diffuse hypokinesis. Akinesis of the anteroseptal and   inferoseptal myocardium. Features are consistent with a   pseudonormal left ventricular filling pattern, with concomitant   abnormal relaxation  and increased filling pressure (grade 2   diastolic dysfunction). Doppler parameters are consistent with   high ventricular filling pressure. - Aortic valve:  A bioprosthesis was present. There was moderate   stenosis. There was moderate regurgitation. Peak velocity (S):   317 cm/s. Mean gradient (S): 26 mm Hg. Valve area (VTI): 1.14   cm^2. Valve area (Vmax): 1.16 cm^2. Valve area (Vmean): 1.08   cm^2. Regurgitation pressure half-time: 271 ms. - Mitral valve: Mobility was restricted. Transvalvular velocity was   within the normal range. There was no evidence for stenosis.   There was moderate regurgitation. - Left atrium: The atrium was moderately dilated. - Right ventricle: The cavity size was normal. Wall thickness was   normal. Systolic function was moderately reduced. - Right atrium: The atrium was severely dilated. - Tricuspid valve: There was severe regurgitation. - Pulmonary arteries: Systolic pressure was severely increased. PA   peak pressure: 70 mm Hg (S). - Pericardium, extracardiac: There was a small left pleural   effusion.  Impressions: - Bioprosthetic aortic valve gradients are moderately elevated but   unchanged from 2015.  Antibiotics:  Augmentin 10/09/16  HPI/Subjective: Afebrile, no CP. Patient underweight in appearance and with signs of fluid overload. Still SOB and with intermittent wet cough spells.  Objective: Vitals:   10/10/16 0426 10/10/16 1425  BP: 103/63 116/69  Pulse: 79 76  Resp: 16 14  Temp: 97.6 F (36.4 C) 98 F (36.7 C)  SpO2: 98% 98%    Intake/Output Summary (Last 24 hours) at 10/10/16 1613 Last data filed at 10/10/16 1436  Gross per 24 hour  Intake              370 ml  Output             1450 ml  Net            -1080 ml   Filed Weights   10/09/16 1001 10/09/16 1526  Weight: 49.9 kg (110 lb) 53.6 kg (118 lb 2.7 oz)    Exam:   General: afebrile, still with wet cough and and SOB. No nausea, no vomiting. Denies CP. No using  accessory muscles and not requiring oxygen at rest. Patient still with signs of fluid overload.  Cardiovascular: positive SEM, no rubs, no gallops, rate controlled. Mild JVD seen on exam.  Respiratory: fine bibasilar crackles, no wheezing, no using accessory muscles,  Abdomen: soft, NT, ND, positive BS  Musculoskeletal: 1-2++ edema bilaterally, no cyanosis   Skin: with stage 2 pressure injury on his left heel; also with some break down on his scrotum from moisture environment.   Data Reviewed: Basic Metabolic Panel:  Recent Labs Lab 10/09/16 1113 10/09/16 1922 10/10/16 0105  NA 141  --  142  K 3.5  --  4.0  CL 110  --  111  CO2 21*  --  22  GLUCOSE 121*  --  149*  BUN 37*  --  35*  CREATININE 0.93 0.93 0.91  CALCIUM 8.6*  --  8.5*  MG  --  2.0  --    CBC:  Recent Labs Lab 10/09/16 1113 10/09/16 1922 10/10/16 0105  WBC 5.8 6.0 6.3  NEUTROABS 4.3  --   --   HGB 10.4* 10.1* 9.6*  HCT 32.4* 31.9* 29.5*  MCV 90.8 91.7 90.5  PLT 37* 49* 50*   Cardiac Enzymes:  Recent Labs Lab 10/09/16 1922 10/10/16 0105 10/10/16 0640  TROPONINI 0.09* 0.09* 0.09*   BNP (last 3 results)  Recent Labs  10/09/16 1258  BNP 2,898.4*    Studies: Dg Chest 2 View  Result Date: 10/09/2016 CLINICAL DATA:  Shortness of  breath over the last 2 days.  Wheezing. EXAM: CHEST  2 VIEW COMPARISON:  09/07/2016 FINDINGS: Previous median sternotomy and aortic valve replacement. Cardiomegaly with left ventricular prominence. Aortic atherosclerosis. Development of bilateral effusions with dependent atelectasis. Mild interstitial edema. Findings probably secondary to heart failure, but basilar pneumonia is not excluded. No acute bone finding. IMPRESSION: Radiographic worsening with development of bilateral effusions and bilateral lower lobe atelectasis and/or pneumonia. Mild interstitial edema. Electronically Signed   By: Nelson Chimes M.D.   On: 10/09/2016 11:07    Scheduled Meds: .  amoxicillin-clavulanate  400 mg Oral Q12H  . aspirin EC  81 mg Oral Daily  . atorvastatin  40 mg Oral QHS  . carvedilol  3.125 mg Oral BID WC  . docusate sodium  100 mg Oral QODAY  . donepezil  10 mg Oral QHS  . feeding supplement (ENSURE ENLIVE)  237 mL Oral BID BM  . furosemide  30 mg Intravenous Q12H  . guaiFENesin  600 mg Oral BID  . Influenza vac split quadrivalent PF  0.5 mL Intramuscular Tomorrow-1000  . losartan  12.5 mg Oral Daily  . multivitamin with minerals  1 tablet Oral Daily  . polyethylene glycol  17 g Oral Daily  . potassium chloride  20 mEq Oral BID  . sodium chloride flush  3 mL Intravenous Q12H   Continuous Infusions: . sodium chloride      Principal Problem:   SOB (shortness of breath) Active Problems:   Acquired musculoskeletal deformity   Left hemiparesis (HCC)   Community acquired pneumonia   Paroxysmal atrial fibrillation (HCC)   Protein-calorie malnutrition, severe   Acute on chronic systolic CHF (congestive heart failure) (HCC)   Elevated troponin   Pressure injury of skin    Time spent: 30 minutes    Barton Dubois  [facility] Pager [phone]. If 7PM-7AM, please contact night-coverage at [email or web address], password [facility] 10/10/2016, 4:13 PM  LOS: 1 day

## 2016-10-11 DIAGNOSIS — J181 Lobar pneumonia, unspecified organism: Secondary | ICD-10-CM

## 2016-10-11 LAB — BASIC METABOLIC PANEL
ANION GAP: 9 (ref 5–15)
BUN: 44 mg/dL — AB (ref 6–20)
CALCIUM: 8.3 mg/dL — AB (ref 8.9–10.3)
CO2: 23 mmol/L (ref 22–32)
Chloride: 111 mmol/L (ref 101–111)
Creatinine, Ser: 1.05 mg/dL (ref 0.61–1.24)
GFR calc Af Amer: >60 mL/min (ref >60)
Glucose, Bld: 130 mg/dL — ABNORMAL HIGH (ref 65–99)
POTASSIUM: 4.5 mmol/L (ref 3.5–5.1)
SODIUM: 143 mmol/L (ref 135–145)

## 2016-10-11 NOTE — Progress Notes (Signed)
Initial Nutrition Assessment  DOCUMENTATION CODES:   Severe malnutrition in context of chronic illness  INTERVENTION:   -Continue Ensure Enlive po BID, each supplement provides 350 kcal and 20 grams of protein -RD will continue to monitor  NUTRITION DIAGNOSIS:   Malnutrition(severe) related to chronic illness (dementia, dysphagia) as evidenced by severe depletion of body fat, severe depletion of muscle mass.  GOAL:   Patient will meet greater than or equal to 90% of their needs  MONITOR:   PO intake, Supplement acceptance, Labs, Weight trends, I & O's, Skin  REASON FOR ASSESSMENT:   Consult Assessment of nutrition requirement/status  ASSESSMENT:   83 year old male with a past medical history significant for hypertension, hyperlipidemia, paroxysmal atrial fibrillation (not on anticoagulation), history of dementia with behavioral disturbances and history of ischemic stroke with left hemiparesis; who presented to the major department secondary to increased shortness of breath and productive cough. Per family members patient's symptoms have been present for the last 3-4 days and worsening. There has not been any hemoptysis or fever. He has also noticed increased swelling of his lower extremities. No nausea, no vomiting,no dysuria or hematuria, no abdominal pain, no chest pain, no headaches or any new focal deficit. Of note patient has had increased urinary frequency and develop candida infection on his scrotal skin with positive breakdown.  Patient in room sleeping with son and daughter at bedside. Pt's daughter states she takes care of him the most and states he was eating well PTA. States his appetite worsened since being admitted. She chops and purees all of his food for him d/t his chronic dysphagia from a previous stroke. Pt edentulous. Currently on a dysphagia 2 diet with pureed meats and nectar thick liquids. Some meats have not been coming up pureed on the trays. RD will make note  in meal ordering system that meats must be pureed.  At home, pt would drink 1/2 a bottle of Ensure at times when his daughter felt like he needed it. He did not drink it consistently. Encouraged family to provide 2 bottles of Ensure daily from now on.   Per chart review, pt has lost 5 lb since 4/16, insignificant for time frame. Pt has had progressive weight loss since 2014, weighed 180 lb then. Nutrition-Focused physical exam completed. Findings are severe fat depletion, severe muscle depletion, and mild edema.   Labs reviewed. Medications: Colace capsule every other day, IV Lasix every 12 hours, Multivitamin with minerals daily, Miralax packet daily, K-DUR tablet BID  Diet Order:  DIET DYS 2 Room service appropriate? Yes with Assist; Fluid consistency: Nectar Thick  Skin:   (Stage II lt heel pressure injury)  Last BM:  10/7  Height:   Ht Readings from Last 1 Encounters:  10/09/16 5\' 5"  (1.651 m)    Weight:   Wt Readings from Last 1 Encounters:  10/11/16 119 lb 4.3 oz (54.1 kg)    Ideal Body Weight:  61.8 kg  BMI:  Body mass index is 19.85 kg/m.  Estimated Nutritional Needs:   Kcal:  1350-1550  Protein:  70-80g  Fluid:  1.5L/day  EDUCATION NEEDS:   No education needs identified at this time  Clayton Bibles, MS, RD, LDN Pager: [phone] After Hours Pager: [phone]

## 2016-10-11 NOTE — Progress Notes (Signed)
[facility] PROGRESS NOTE  Aaron Johnson VOH:[id number] DOB: 02-22-1926 DOA: 10/09/2016 PCP: Seward Carol, MD  Interim summary and HPI 83 year old male with a past medical history significant for hypertension, hyperlipidemia, paroxysmal atrial fibrillation (not on anticoagulation), history of dementia with behavioral disturbances and history of ischemic stroke with left hemiparesis; who presented to the major department secondary to increased shortness of breath and productive cough. Per family members patient's symptoms have been present for the last 3-4 days and worsening. There has not been any hemoptysis or fever. He has also noticed increased swelling of his lower extremities. No nausea, no vomiting,no dysuria or hematuria, no abdominal pain, no chest pain, no headaches or any new focal deficit. Of note patient has had increased urinary frequency and develop candida infection on his scrotal skin with positive breakdown.  Assessment/Plan: 1-SOB (shortness of breath): appears to be a combination of pneumonia and CHF exacerbation. -patient continue to SOB and with wet coughing spell. -will continue treatment with augmentin suspension for PNA/bronchitic changes -will also continue IV lasix -follow strict I's and O's -daily weights and strict intake/output -continue heart healthy diet -continue to monitor on telemetry, follow electrolytes and renal function   2-hx of ischemic stroke with residual Left hemiparesis ([facility]): -Will continue aspirin and statins for secondary prevention -No new focal deficit appreciated -Continue supportive care and assistance.  3-Paroxysmal atrial fibrillation (HCC) -currently sinus rhythm and rate control -Low dose Coreg has been added as part of the treatment for his heart failure, this will also provide protection for further rate control. -Continue the use of aspirin -CHADsVASC score >3; but not a candidate for anticoagulation  therapy  4-Protein-calorie malnutrition, severe -nutritional consult has been consulted -Will continue Ensure Plus as feeding supplement -encourage to follow proper PO intake and adequate hydration.  5-acute on chronic systolic heart failure -continue Daily weights, strict intake and output, continue IV Lasix - 2-D echo demonstrating EF of 20-25%, diffuse hypokinesis  -will continue ARB and low dose b-blcoker  -no SOB and with much improvement in fluid overload.  6-Elevated troponin -remains CP free -Just mild flat elevation; remained in 0.09 range -appears to be secondary to demand ischemia from CHF exacerbation. -Continue aspirin, statins and low-dose beta blocker.  7-dysphagia -continue dysphagia 2 diet with thin liquids -appears to be residual side effects from ischemic stroke  8-dementia with behavioral disturbances -Will continue Aricept and Seroquel -stable and w/o agitations -continue supportive care  9-HLD -will continue statins   10-thrombocytopenia -will avoid heparin products overnight -will repeat CBC in am -continue to use SCD's for prophylaxis -no signs of acute bleeding  11-stage 2 pressure injury and scrotal skin breakdown. -left heel, present on admission -will use preventive measures  Code Status: DNR Family Communication: daughter at bedside  Disposition Plan: remains inpatient, will continue oral augmentin and IV diuretics for another 24 hours. Follow daily weights and continue supportive care. Most likely home tomorrow.    Consultants:  None   Procedures:  2-D echo - Left ventricle: The cavity size was normal. There was mild   concentric hypertrophy. Systolic function was severely reduced.   The estimated ejection fraction was in the range of 20% to 25%.   Diffuse hypokinesis. Akinesis of the anteroseptal and   inferoseptal myocardium. Features are consistent with a   pseudonormal left ventricular filling pattern, with  concomitant   abnormal relaxation and increased filling pressure (grade 2   diastolic dysfunction). Doppler parameters are consistent with   high ventricular filling pressure. -  Aortic valve: A bioprosthesis was present. There was moderate   stenosis. There was moderate regurgitation. Peak velocity (S):   317 cm/s. Mean gradient (S): 26 mm Hg. Valve area (VTI): 1.14   cm^2. Valve area (Vmax): 1.16 cm^2. Valve area (Vmean): 1.08   cm^2. Regurgitation pressure half-time: 271 ms. - Mitral valve: Mobility was restricted. Transvalvular velocity was   within the normal range. There was no evidence for stenosis.   There was moderate regurgitation. - Left atrium: The atrium was moderately dilated. - Right ventricle: The cavity size was normal. Wall thickness was   normal. Systolic function was moderately reduced. - Right atrium: The atrium was severely dilated. - Tricuspid valve: There was severe regurgitation. - Pulmonary arteries: Systolic pressure was severely increased. PA   peak pressure: 70 mm Hg (S). - Pericardium, extracardiac: There was a small left pleural   effusion.  Impressions: - Bioprosthetic aortic valve gradients are moderately elevated but   unchanged from 2015.  Antibiotics:  Augmentin 10/09/16  HPI/Subjective: Afebrile, no CP, no nausea, no vomiting and improved air movement.   Objective: Vitals:   10/11/16 0442 10/11/16 1600  BP: 105/75 105/69  Pulse: 76 97  Resp: (!) 126   Temp: 98.5 F (36.9 C) 97.8 F (36.6 C)  SpO2: 97% (!) 85%    Intake/Output Summary (Last 24 hours) at 10/11/16 1911 Last data filed at 10/11/16 1849  Gross per 24 hour  Intake              120 ml  Output             1400 ml  Net            -1280 ml   Filed Weights   10/09/16 1526 10/10/16 1425 10/11/16 0442  Weight: 53.6 kg (118 lb 2.7 oz) 53.1 kg (117 lb) 54.1 kg (119 lb 4.3 oz)    Exam:   General: no fever. Breathing better. No CP, no nausea, no vomiting. No CP and  improved volume status.  Cardiovascular: positive SEM, no rubs, no gallops, no JVD seen.   Respiratory: improved air movement, no wheezing, normal resp effort  Abdomen: soft, NT, ND, positive BS  Musculoskeletal: 1++ edema, bilaterally, no cyanosis    Skin: unchanged stage 2 pressure injury on his left heel; also with some break down on his scrotum from moisture environment (last one is better with usage of barrier creams).   Data Reviewed: Basic Metabolic Panel:  Recent Labs Lab 10/09/16 1113 10/09/16 1922 10/10/16 0105 10/11/16 0434  NA 141  --  142 143  K 3.5  --  4.0 4.5  CL 110  --  111 111  CO2 21*  --  22 23  GLUCOSE 121*  --  149* 130*  BUN 37*  --  35* 44*  CREATININE 0.93 0.93 0.91 1.05  CALCIUM 8.6*  --  8.5* 8.3*  MG  --  2.0  --   --    CBC:  Recent Labs Lab 10/09/16 1113 10/09/16 1922 10/10/16 0105  WBC 5.8 6.0 6.3  NEUTROABS 4.3  --   --   HGB 10.4* 10.1* 9.6*  HCT 32.4* 31.9* 29.5*  MCV 90.8 91.7 90.5  PLT 37* 49* 50*   Cardiac Enzymes:  Recent Labs Lab 10/09/16 1922 10/10/16 0105 10/10/16 0640  TROPONINI 0.09* 0.09* 0.09*   BNP (last 3 results)  Recent Labs  10/09/16 1258  BNP 2,898.4*    Studies: No results found.  Scheduled Meds: .  amoxicillin-clavulanate  400 mg Oral Q12H  . aspirin EC  81 mg Oral Daily  . atorvastatin  40 mg Oral QHS  . carvedilol  3.125 mg Oral BID WC  . docusate sodium  100 mg Oral QODAY  . donepezil  10 mg Oral QHS  . feeding supplement (ENSURE ENLIVE)  237 mL Oral BID BM  . furosemide  30 mg Intravenous Q12H  . guaiFENesin  600 mg Oral BID  . losartan  12.5 mg Oral Daily  . multivitamin with minerals  1 tablet Oral Daily  . polyethylene glycol  17 g Oral Daily  . potassium chloride  20 mEq Oral BID  . sodium chloride flush  3 mL Intravenous Q12H   Continuous Infusions: . sodium chloride      Principal Problem:   SOB (shortness of breath) Active Problems:   Acquired musculoskeletal  deformity   Left hemiparesis (HCC)   Community acquired pneumonia   Paroxysmal atrial fibrillation (HCC)   Protein-calorie malnutrition, severe   Acute on chronic systolic CHF (congestive heart failure) (HCC)   Elevated troponin   Pressure injury of skin    Time spent: 30 minutes    Barton Dubois  [facility] Pager [phone]. If 7PM-7AM, please contact night-coverage at [email or web address], password [facility] 10/11/2016, 7:11 PM  LOS: 2 days

## 2016-10-11 NOTE — Progress Notes (Signed)
Daughter at bedside selected [place], [place].  Referral called [facility].

## 2016-10-11 NOTE — Progress Notes (Signed)
Chaplain responding to spiritual care consult regarding advance directive.    Pt has history of dementia and stroke.  Conferred with RN who reports pt is not fully oriented.    Will consult with pt's daughter when she is present to assess need for Adv. Dir.      [facility] / [place], MDiv

## 2016-10-11 NOTE — Progress Notes (Signed)
[facility] Liaison RN Visit  Notified my Myraette McGibboney, [facility], of family request for [facility] services at home after discharge. Chart and patient information under review by [facility] physician. Hospice eligibility pending at this time.  Spoke with daughter Levada Dy at beside to initiate education related to hospice philosophy, services and team approach to care. Levada Dy verbalized understanding of information given. Per discussion, plan is for discharge to home by private vehicle on 10/12/16.  DME needs have been discussed. Patient currently has a hospital bed, walkers, wheelchairs and hoyer lift in the home. Levada Dy requests an over the bed table, suction and tall transfer bench to be delivered to the home. [facility] equipment manager has been notified and will contact [employer] to arrange delivery in the home. Home address has been verified and is correct in the chart. Levada Dy is the family member to contact to arrange time of delivery.  [facility] Referral Center aware of the above. Completed discharge summary will need to be faxed to [facility] at [phone] when final. Please notify [facility] when patient ready to leave the unit at discharge by calling [phone]. [facility] information and contact numbers given to Levada Dy this visit. Above information shared with Myraette McGibboney, RNCM.  Please send signed and completed DNR form home with patient/family. Patient will need prescriptions for discharge comfort medications.  Please call with any hospice related questions or concerns.  Thank you for this referral.  Margaretmary Eddy, RN, BSN [facility] Liaison [phone]  [place] are on [facility]ION.

## 2016-10-12 LAB — CBC
HCT: 30.5 % — ABNORMAL LOW (ref 39.0–52.0)
HEMOGLOBIN: 9.6 g/dL — AB (ref 13.0–17.0)
MCH: 28.8 pg (ref 26.0–34.0)
MCHC: 31.5 g/dL (ref 30.0–36.0)
MCV: 91.6 fL (ref 78.0–100.0)
Platelets: 61 10*3/uL — ABNORMAL LOW (ref 150–400)
RBC: 3.33 MIL/uL — AB (ref 4.22–5.81)
RDW: 14.8 % (ref 11.5–15.5)
WBC: 7.9 10*3/uL (ref 4.0–10.5)

## 2016-10-12 LAB — BASIC METABOLIC PANEL
Anion gap: 10 (ref 5–15)
BUN: 51 mg/dL — ABNORMAL HIGH (ref 6–20)
CHLORIDE: 112 mmol/L — AB (ref 101–111)
CO2: 21 mmol/L — AB (ref 22–32)
Calcium: 8.4 mg/dL — ABNORMAL LOW (ref 8.9–10.3)
Creatinine, Ser: 1.09 mg/dL (ref 0.61–1.24)
GFR calc non Af Amer: 59 mL/min — ABNORMAL LOW (ref >60)
Glucose, Bld: 142 mg/dL — ABNORMAL HIGH (ref 65–99)
POTASSIUM: 4.7 mmol/L (ref 3.5–5.1)
SODIUM: 143 mmol/L (ref 135–145)

## 2016-10-12 MED ORDER — AMOXICILLIN-POT CLAVULANATE 400-57 MG/5ML PO SUSR: 400.0000 mg | Freq: Two times a day (BID) | ORAL | 0 refills | Status: AC

## 2016-10-12 MED ORDER — CARVEDILOL 3.125 MG PO TABS: 3.1250 mg | ORAL_TABLET | Freq: Two times a day (BID) | ORAL | 1 refills | Status: AC

## 2016-10-12 MED ORDER — ENSURE ENLIVE PO LIQD: 237.0000 mL | Freq: Two times a day (BID) | ORAL | 12 refills | Status: AC

## 2016-10-12 MED ORDER — LOSARTAN POTASSIUM 25 MG PO TABS: 12.5000 mg | ORAL_TABLET | Freq: Every day | ORAL | 1 refills | Status: AC

## 2016-10-12 MED ORDER — ACETAMINOPHEN 325 MG PO TABS: 650.0000 mg | ORAL_TABLET | ORAL | 0 refills | Status: AC | PRN

## 2016-10-12 MED ORDER — GUAIFENESIN ER 600 MG PO TB12: 600.0000 mg | ORAL_TABLET | Freq: Two times a day (BID) | ORAL | 1 refills | Status: AC

## 2016-10-12 NOTE — Evaluation (Signed)
Physical Therapy Evaluation Patient Details Name: Aaron Johnson MRN: [id number] DOB: [birth year] Today's Date: 10/12/2016   History of Present Illness  83 year old male with a past medical history significant for hypertension, hyperlipidemia, paroxysmal atrial fibrillation (not on anticoagulation), dementia with behavioral disturbances, bilateral visual impairment and history of ischemic stroke with left hemiparesis and admitted for SOB (shortness of breath): appears to be a combination of pneumonia and CHF exacerbation  Clinical Impression  Patient evaluated by Physical Therapy with no further acute PT needs identified. All education has been completed and the patient has no further questions.  Pt's daughter in room and requested PT work with pt prior to d/c to assist with strengthening and mobility.  Pt's daughter typically assists pt with transfers at home.  Pt assisted to EOB and sat EOB for approx 6 minutes.  Pt then assisted with standing however pt unable to physically assist and maintain upright erect posture.  Daughter present and assisted as needed during evaluation.  See below for any follow-up Physical Therapy or equipment needs. Pt to d/c home today with hospice, so PT is signing off. Thank you for this referral.     Follow Up Recommendations No PT follow up    Equipment Recommendations  Other (comment) (lift?)    Recommendations for Other Services       Precautions / Restrictions Precautions Precautions: Fall Restrictions Weight Bearing Restrictions: No      Mobility  Bed Mobility Overal bed mobility: Needs Assistance Bed Mobility: Supine to Sit;Sit to Supine     Supine to sit: Total assist Sit to supine: Total assist   General bed mobility comments: pt only able to slightly assist with moving R LE, assist for upper and lower body, utilized bed pad for positioning  Transfers Overall transfer level: Needs assistance Equipment used:  (used back of standard  chair) Transfers: Sit to/from Stand Sit to Stand: Max assist;+2 physical assistance         General transfer comment: daughter reports pt hold onto her or uses back of a chair or grab bar to assist himself upright, utilized back of a chair today however pt required max assist, daughter assisted on one side; unable to stand fully erect  Ambulation/Gait                Stairs            Wheelchair Mobility    Modified Rankin (Stroke Patients Only)       Balance Overall balance assessment: Needs assistance Sitting-balance support: Single extremity supported;Feet supported Sitting balance-Leahy Scale: Poor Sitting balance - Comments: able to maintain upright posture without external assist briefly however mostly requires at least min assist for upright posture due to fatigue and weakness Postural control: Posterior lean                                   Pertinent Vitals/Pain Pain Assessment: Faces Faces Pain Scale: Hurts little more Pain Location: LEs with movement Pain Descriptors / Indicators: Grimacing Pain Intervention(s): Repositioned;Monitored during session    Home Living Family/patient expects to be discharged to:: Private residence Living Arrangements: Children Available Help at Discharge: Family Type of Home: House Home Access: Ramped entrance     Home Layout: One level Home Equipment: Wheelchair - manual;Bedside commode;Hospital bed      Prior Function Level of Independence: Needs assistance   Gait / Transfers Assistance Needed: daughter assists pt with  transfers, pt typically able to sit without support  ADL's / Homemaking Assistance Needed: total        Hand Dominance        Extremity/Trunk Assessment   Upper Extremity Assessment Upper Extremity Assessment: LUE deficits/detail LUE Deficits / Details: L hemiparesis due to hx of CVA    Lower Extremity Assessment Lower Extremity Assessment: Generalized weakness;LLE  deficits/detail (diffuse muscle atrophy) LLE Deficits / Details: L LE grossly weaker then R LE - hx of L hemiparesis due to hx of CVA       Communication   Communication: Receptive difficulties;Expressive difficulties  Cognition Arousal/Alertness: Awake/alert Behavior During Therapy: WFL for tasks assessed/performed Overall Cognitive Status: History of cognitive impairments - at baseline                                 General Comments: daughter reports pt is blind, keeps eyes closed most of the time, requires multimodal cues      General Comments      Exercises     Assessment/Plan    PT Assessment All further PT needs can be met in the next venue of care  PT Problem List Decreased strength;Decreased mobility;Decreased balance;Decreased activity tolerance       PT Treatment Interventions      PT Goals (Current goals can be found in the Care Plan section)  Acute Rehab PT Goals PT Goal Formulation: All assessment and education complete, DC therapy    Frequency     Barriers to discharge        Co-evaluation               AM-PAC PT "6 Clicks" Daily Activity  Outcome Measure Difficulty turning over in bed (including adjusting bedclothes, sheets and blankets)?: Unable Difficulty moving from lying on back to sitting on the side of the bed? : Unable Difficulty sitting down on and standing up from a chair with arms (Johnson.g., wheelchair, bedside commode, etc,.)?: Unable Help needed moving to and from a bed to chair (including a wheelchair)?: Total Help needed walking in hospital room?: Total Help needed climbing 3-5 steps with a railing? : Total 6 Click Score: 6    End of Session Equipment Utilized During Treatment: Gait belt Activity Tolerance: Patient limited by fatigue Patient left: in bed;with bed alarm set;with family/visitor present;with call bell/phone within reach        Time: 3382-5053 PT Time Calculation (min) (ACUTE ONLY): 18  min   Charges:   PT Evaluation $PT Eval Moderate Complexity: 1 Mod     PT G CodesCarmelia Johnson, PT, DPT 10/12/2016 Pager: [phone]  Aaron Johnson 10/12/2016, 11:01 AM

## 2016-10-12 NOTE — Discharge Summary (Signed)
Physician Discharge Summary  Aaron Johnson HYQ:[id number] DOB: 1927/01/02 DOA: 10/09/2016  PCP: Seward Carol, MD  Admit date: 10/09/2016 Discharge date: 10/12/2016  Time spent: 35 minutes  Recommendations for Outpatient Follow-up:  1. Repeat BMET to follow electrolytes and renal function  2. Reassess BP and volume status 3. Main goal is for symptom management and minimize hospitalization.   Discharge Diagnoses:  Principal Problem:   SOB (shortness of breath) Active Problems:   Acquired musculoskeletal deformity   Left hemiparesis (HCC)   Community acquired pneumonia   Paroxysmal atrial fibrillation (HCC)   Protein-calorie malnutrition, severe   Acute on chronic systolic CHF (congestive heart failure) (HCC)   Elevated troponin   Pressure injury of skin   Discharge Condition: stable and improved. Discharge home with instructions to follow up with PCP in 10 days. hospice care at home also arranged.  Diet recommendation: heart healthy diet   Filed Weights   10/10/16 1425 10/11/16 0442 10/12/16 0500  Weight: 53.1 kg (117 lb) 54.1 kg (119 lb 4.3 oz) 54.2 kg (119 lb 7.8 oz)    History of present illness:  83 year old male with a past medical history significant for hypertension, hyperlipidemia, paroxysmal atrial fibrillation (not on anticoagulation), history of dementia with behavioral disturbances and history of ischemic stroke with left hemiparesis; who presented to the major department secondary to increased shortness of breath and productive cough. Per family members patient's symptoms have been present for the last 3-4 days and worsening. There has not been any hemoptysis or fever. He has also noticed increased swelling of his lower extremities. No nausea, no vomiting,no dysuria or hematuria, no abdominal pain, no chest pain, no headaches or any new focal deficit. Of note patient has had increased urinary frequency and develop candida infection on his scrotal skin with positive  breakdown.  Hospital Course:  1-SOB (shortness of breath): appears to be a combination of pneumonia and CHF exacerbation. -patient improved overall; given poor overall prognosis and after discussing GOC with family member; decision was to set up hospice care at home. -will continue treatment with augmentin suspension for PNA/bronchitic changes -will discharge on adjusted dose of lasix  -daily weights and watch sodium intake -continue heart healthy diet  2-hx of ischemic stroke with residual Left hemiparesis ([place]): -Will continue aspirin and statins for secondary prevention -No new focal deficit appreciated -Continue supportive care and assistance.  3-Paroxysmal atrial fibrillation (HCC) -currently sinus rhythm and rate control -Low dose Coreg has been added as part of the treatment for his heart failure, this will also provide protection for further rate control. -Continue the use of aspirin -CHADsVASC score >3; but not a candidate for anticoagulation therapy  4-Protein-calorie malnutrition, severe -nutritional consult appreciated -Will continue Ensure Plus as feeding supplement -encourage to follow proper PO intake and adequate hydration.  5-acute on chronic systolic heart failure -continue Daily weights, strict intake and output, continue IV Lasix - 2-D echo demonstrating EF of 20-25%, diffuse hypokinesis  -will continue ARB and low dose b-blcoker  -no SOB and with much improvement in his overall fluid status.  6-Elevated troponin -remains CP free -Just mild flat elevation appreciated (in 0.09 range) -appears to be secondary to demand ischemia from CHF exacerbation. -Continue aspirin, statins and low-dose beta blocker.  7-dysphagia -continue dysphagia 2 diet with thin liquids -appears to be residual side effects from previous ischemic stroke  8-dementia with behavioral disturbances -Will continue Aricept and Seroquel -stable and w/o agitations during  hospitalization. -continue supportive care  9-HLD -will continue  statins   10-thrombocytopenia -will recommend repeat CBC during follow up visit -no signs of acute bleeding  11-stage 2 pressure injury and scrotal skin breakdown. -left heel, present on admission -continue constant repositioning, elevate heels with extra pillows continue using barrier creams to allow scrotal skin to heal  Procedures:  2-D echo - Left ventricle: The cavity size was normal. There was mild concentric hypertrophy. Systolic function was severely reduced. The estimated ejection fraction was in the range of 20% to 25%. Diffuse hypokinesis. Akinesis of the anteroseptal and inferoseptal myocardium. Features are consistent with a pseudonormal left ventricular filling pattern, with concomitant abnormal relaxation and increased filling pressure (grade 2 diastolic dysfunction). Doppler parameters are consistent with high ventricular filling pressure. - Aortic valve: A bioprosthesis was present. There was moderate stenosis. There was moderate regurgitation. Peak velocity (S): 317 cm/s. Mean gradient (S): 26 mm Hg. Valve area (VTI): 1.14 cm^2. Valve area (Vmax): 1.16 cm^2. Valve area (Vmean): 1.08 cm^2. Regurgitation pressure half-time: 271 ms. - Mitral valve: Mobility was restricted. Transvalvular velocity was within the normal range. There was no evidence for stenosis. There was moderate regurgitation. - Left atrium: The atrium was moderately dilated. - Right ventricle: The cavity size was normal. Wall thickness was normal. Systolic function was moderately reduced. - Right atrium: The atrium was severely dilated. - Tricuspid valve: There was severe regurgitation. - Pulmonary arteries: Systolic pressure was severely increased. PA peak pressure: 70 mm Hg (S). - Pericardium, extracardiac: There was a small left pleural effusion.  Impressions: - Bioprosthetic aortic  valve gradients are moderately elevated but unchanged from 2015.  Consultations:  None   Discharge Exam: Vitals:   10/11/16 2034 10/12/16 0532  BP: 126/65 102/65  Pulse: 71 79  Resp: 20 16  Temp: 98.2 F (36.8 C) 98.1 F (36.7 C)  SpO2: 99% 96%    General: no fever. Breathing a lot better. No CP, no nausea, no vomiting. improved volume status.  Cardiovascular: positive SEM, no rubs, no gallops, no JVD seen.   Respiratory: improved air movement, no wheezing, normal resp effort  Abdomen: soft, NT, ND, positive BS  Musculoskeletal: trace to 1++ edema, bilaterally, no cyanosis    Skin: unchanged stage 2 pressure injury on his left heel; also with some break down on his scrotum from moisture environment (last one is much better with usage of barrier creams).   Discharge Instructions    Current Discharge Medication List    START taking these medications   Details  acetaminophen (TYLENOL) 325 MG tablet Take 2 tablets (650 mg total) by mouth every 4 (four) hours as needed for headache or mild pain. Qty: 40 tablet, Refills: 0    amoxicillin-clavulanate (AUGMENTIN) 400-57 MG/5ML suspension Take 5 mLs (400 mg total) by mouth every 12 (twelve) hours. Qty: 50 mL, Refills: 0    carvedilol (COREG) 3.125 MG tablet Take 1 tablet (3.125 mg total) by mouth 2 (two) times daily with a meal. Qty: 30 tablet, Refills: 1    feeding supplement, ENSURE ENLIVE, (ENSURE ENLIVE) LIQD Take 237 mLs by mouth 2 (two) times daily between meals. Qty: 3400 mL, Refills: 12      CONTINUE these medications which have CHANGED   Details  guaiFENesin (MUCINEX) 600 MG 12 hr tablet Take 1 tablet (600 mg total) by mouth 2 (two) times daily. Qty: 60 tablet, Refills: 1    losartan (COZAAR) 25 MG tablet Take 0.5 tablets (12.5 mg total) by mouth daily. Qty: 30 tablet, Refills: 1  CONTINUE these medications which have NOT CHANGED   Details  aspirin 81 MG tablet Take 1 tablet (81 mg total) by mouth  daily. Resume after 7 days from 11/26/13 Qty: 30 tablet    atorvastatin (LIPITOR) 40 MG tablet Take 40 mg by mouth at bedtime.     docusate sodium (COLACE) 100 MG capsule Take 100 mg by mouth every other day. Rotate with Senna-Docusate tablet    donepezil (ARICEPT) 10 MG tablet Take 1 tablet by mouth at bedtime.    Multiple Vitamin (MULTIVITAMIN) tablet Take 1 tablet by mouth daily.      nystatin cream (MYCOSTATIN) Apply 1 application topically 2 (two) times daily. Refills: 0    oxyCODONE-acetaminophen (PERCOCET) 5-325 MG per tablet Take 0.5-1 tablets by mouth every 4 (four) hours as needed for moderate pain.     polyethylene glycol (MIRALAX / GLYCOLAX) packet Take 17 g by mouth daily.    potassium chloride (KLOR-CON) 20 MEQ packet Take 20 mEq by mouth daily as needed (when taking Lasix).     QUEtiapine (SEROQUEL) 25 MG tablet Take 12.5-25 mg by mouth at bedtime as needed (agitation). Depending on severity of sundowning     senna-docusate (SENOKOT-S) 8.6-50 MG tablet Take 1 tablet by mouth at bedtime. Qty: 30 tablet, Refills: 0    traZODone (DESYREL) 100 MG tablet Take 50-100 mg by mouth at bedtime as needed for sleep.  Refills: 3      STOP taking these medications     furosemide (LASIX) 20 MG tablet        No Known Allergies   The results of significant diagnostics from this hospitalization (including imaging, microbiology, ancillary and laboratory) are listed below for reference.    Significant Diagnostic Studies: Dg Chest 2 View  Result Date: 10/09/2016 CLINICAL DATA:  Shortness of breath over the last 2 days.  Wheezing. EXAM: CHEST  2 VIEW COMPARISON:  09/07/2016 FINDINGS: Previous median sternotomy and aortic valve replacement. Cardiomegaly with left ventricular prominence. Aortic atherosclerosis. Development of bilateral effusions with dependent atelectasis. Mild interstitial edema. Findings probably secondary to heart failure, but basilar pneumonia is not excluded.  No acute bone finding. IMPRESSION: Radiographic worsening with development of bilateral effusions and bilateral lower lobe atelectasis and/or pneumonia. Mild interstitial edema. Electronically Signed   By: Nelson Chimes M.D.   On: 10/09/2016 11:07   Dg Toe Great Right  Result Date: 09/24/2016 CLINICAL DATA:  Pain in the distal right great toe after injury. EXAM: RIGHT GREAT TOE COMPARISON:  None. FINDINGS: Diffuse bone demineralization. No evidence of acute fracture or dislocation in the right first toe. No focal bone lesion or bone erosion. Joint spaces are preserved. Diffuse vascular calcifications in the soft tissues. IMPRESSION: No acute bony abnormalities. Diffuse demineralization. Vascular calcifications. Electronically Signed   By: Lucienne Capers M.D.   On: 09/24/2016 21:42   Labs: Basic Metabolic Panel:  Recent Labs Lab 10/09/16 1113 10/09/16 1922 10/10/16 0105 10/11/16 0434 10/12/16 0504  NA 141  --  142 143 143  K 3.5  --  4.0 4.5 4.7  CL 110  --  111 111 112*  CO2 21*  --  22 23 21*  GLUCOSE 121*  --  149* 130* 142*  BUN 37*  --  35* 44* 51*  CREATININE 0.93 0.93 0.91 1.05 1.09  CALCIUM 8.6*  --  8.5* 8.3* 8.4*  MG  --  2.0  --   --   --    CBC:  Recent Labs Lab 10/09/16  1113 10/09/16 1922 10/10/16 0105 10/12/16 0504  WBC 5.8 6.0 6.3 7.9  NEUTROABS 4.3  --   --   --   HGB 10.4* 10.1* 9.6* 9.6*  HCT 32.4* 31.9* 29.5* 30.5*  MCV 90.8 91.7 90.5 91.6  PLT 37* 49* 50* 61*   Cardiac Enzymes:  Recent Labs Lab 10/09/16 1922 10/10/16 0105 10/10/16 0640  TROPONINI 0.09* 0.09* 0.09*   BNP: BNP (last 3 results)  Recent Labs  10/09/16 1258  BNP 2,898.4*    Signed:  Barton Dubois MD.  [facility] 10/12/2016, 2:35 PM

## 2016-10-12 NOTE — Care Management Important Message (Signed)
Important Message  Patient Details  Name: Aaron Johnson MRN: [id number] Date of Birth: [birth year]   Medicare Important Message Given:  Yes    Kerin Salen 10/12/2016, 12:05 [place] Message  Patient Details  Name: Aaron Johnson MRN: [id number] Date of Birth: [birth year]   Medicare Important Message Given:  Yes    Kerin Salen 10/12/2016, 12:03 PM

## 2016-10-15 ENCOUNTER — Ambulatory Visit: Payer: Medicare Other | Admitting: Podiatry

## 2016-11-04 DEATH — deceased

## 2016-11-18 ENCOUNTER — Ambulatory Visit: Payer: Medicaid Other | Admitting: Podiatry
